# Patient Record
Sex: Female | Born: 1955 | Race: Black or African American | Hispanic: No | Smoking: Never smoker
Health system: Southern US, Community
[De-identification: ages and names within clinical notes are randomized; demographics above are authoritative.]

## PROBLEM LIST (undated history)

## (undated) DIAGNOSIS — I10 Essential (primary) hypertension: Secondary | ICD-10-CM

## (undated) DIAGNOSIS — K219 Gastro-esophageal reflux disease without esophagitis: Secondary | ICD-10-CM

## (undated) DIAGNOSIS — E079 Disorder of thyroid, unspecified: Secondary | ICD-10-CM

## (undated) DIAGNOSIS — G5600 Carpal tunnel syndrome, unspecified upper limb: Secondary | ICD-10-CM

## (undated) DIAGNOSIS — D219 Benign neoplasm of connective and other soft tissue, unspecified: Secondary | ICD-10-CM

## (undated) DIAGNOSIS — M549 Dorsalgia, unspecified: Secondary | ICD-10-CM

## (undated) DIAGNOSIS — M779 Enthesopathy, unspecified: Secondary | ICD-10-CM

## (undated) DIAGNOSIS — R0602 Shortness of breath: Secondary | ICD-10-CM

## (undated) DIAGNOSIS — R011 Cardiac murmur, unspecified: Secondary | ICD-10-CM

## (undated) DIAGNOSIS — E039 Hypothyroidism, unspecified: Secondary | ICD-10-CM

## (undated) DIAGNOSIS — M25519 Pain in unspecified shoulder: Secondary | ICD-10-CM

## (undated) DIAGNOSIS — D649 Anemia, unspecified: Secondary | ICD-10-CM

## (undated) DIAGNOSIS — M7710 Lateral epicondylitis, unspecified elbow: Secondary | ICD-10-CM

## (undated) DIAGNOSIS — M6740 Ganglion, unspecified site: Secondary | ICD-10-CM

## (undated) DIAGNOSIS — R51 Headache: Secondary | ICD-10-CM

## (undated) HISTORY — PX: CARDIAC CATHETERIZATION: SHX172

## (undated) HISTORY — PX: ABDOMINAL HYSTERECTOMY: SHX81

## (undated) HISTORY — PX: CHOLECYSTECTOMY: SHX55

## (undated) HISTORY — PX: OTHER SURGICAL HISTORY: SHX169

## (undated) HISTORY — PX: TUBAL LIGATION: SHX77

## (undated) HISTORY — PX: THYROID SURGERY: SHX805

## (undated) HISTORY — PX: ROTATOR CUFF REPAIR: SHX139

## (undated) HISTORY — PX: BACK SURGERY: SHX140

---

## 2003-06-20 ENCOUNTER — Encounter: Admission: RE | Admit: 2003-06-20 | Discharge: 2003-06-20 | Payer: Self-pay | Admitting: Ophthalmology

## 2003-07-04 ENCOUNTER — Encounter: Admission: RE | Admit: 2003-07-04 | Discharge: 2003-07-04 | Payer: Self-pay | Admitting: *Deleted

## 2003-12-21 ENCOUNTER — Emergency Department (HOSPITAL_COMMUNITY): Admission: EM | Admit: 2003-12-21 | Discharge: 2003-12-21 | Payer: Self-pay | Admitting: Family Medicine

## 2004-10-27 ENCOUNTER — Encounter: Admission: RE | Admit: 2004-10-27 | Discharge: 2004-11-30 | Payer: Self-pay | Admitting: Orthopedic Surgery

## 2005-02-02 ENCOUNTER — Encounter: Admission: RE | Admit: 2005-02-02 | Discharge: 2005-02-02 | Payer: Self-pay | Admitting: Internal Medicine

## 2005-04-09 ENCOUNTER — Encounter: Admission: RE | Admit: 2005-04-09 | Discharge: 2005-04-22 | Payer: Self-pay | Admitting: Orthopedic Surgery

## 2005-06-15 ENCOUNTER — Observation Stay (HOSPITAL_COMMUNITY): Admission: RE | Admit: 2005-06-15 | Discharge: 2005-06-16 | Payer: Self-pay | Admitting: Orthopedic Surgery

## 2005-11-09 ENCOUNTER — Encounter: Admission: RE | Admit: 2005-11-09 | Discharge: 2005-11-29 | Payer: Self-pay | Admitting: Orthopedic Surgery

## 2006-01-17 ENCOUNTER — Emergency Department (HOSPITAL_COMMUNITY): Admission: EM | Admit: 2006-01-17 | Discharge: 2006-01-17 | Payer: Self-pay | Admitting: Family Medicine

## 2006-02-02 ENCOUNTER — Encounter: Admission: RE | Admit: 2006-02-02 | Discharge: 2006-05-03 | Payer: Self-pay | Admitting: Specialist

## 2006-04-20 ENCOUNTER — Inpatient Hospital Stay (HOSPITAL_COMMUNITY): Admission: AD | Admit: 2006-04-20 | Discharge: 2006-04-21 | Payer: Self-pay | Admitting: Orthopedic Surgery

## 2006-04-25 ENCOUNTER — Emergency Department (HOSPITAL_COMMUNITY): Admission: EM | Admit: 2006-04-25 | Discharge: 2006-04-26 | Payer: Self-pay | Admitting: Emergency Medicine

## 2006-05-20 ENCOUNTER — Encounter: Admission: RE | Admit: 2006-05-20 | Discharge: 2006-08-18 | Payer: Self-pay | Admitting: Specialist

## 2006-06-21 ENCOUNTER — Encounter: Admission: RE | Admit: 2006-06-21 | Discharge: 2006-08-05 | Payer: Self-pay | Admitting: Orthopedic Surgery

## 2006-08-12 ENCOUNTER — Encounter: Admission: RE | Admit: 2006-08-12 | Discharge: 2006-08-12 | Payer: Self-pay | Admitting: Orthopedic Surgery

## 2006-09-27 ENCOUNTER — Encounter: Admission: RE | Admit: 2006-09-27 | Discharge: 2006-09-27 | Payer: Self-pay | Admitting: Orthopedic Surgery

## 2006-10-27 ENCOUNTER — Encounter: Admission: RE | Admit: 2006-10-27 | Discharge: 2006-10-27 | Payer: Self-pay | Admitting: Specialist

## 2006-12-12 ENCOUNTER — Encounter: Admission: RE | Admit: 2006-12-12 | Discharge: 2006-12-12 | Payer: Self-pay | Admitting: Internal Medicine

## 2007-03-06 ENCOUNTER — Encounter: Admission: RE | Admit: 2007-03-06 | Discharge: 2007-03-06 | Payer: Self-pay | Admitting: Internal Medicine

## 2007-04-10 ENCOUNTER — Emergency Department (HOSPITAL_COMMUNITY): Admission: EM | Admit: 2007-04-10 | Discharge: 2007-04-11 | Payer: Self-pay | Admitting: Emergency Medicine

## 2007-06-21 ENCOUNTER — Encounter: Admission: RE | Admit: 2007-06-21 | Discharge: 2007-06-21 | Payer: Self-pay | Admitting: Anesthesiology

## 2007-06-28 ENCOUNTER — Emergency Department (HOSPITAL_COMMUNITY): Admission: EM | Admit: 2007-06-28 | Discharge: 2007-06-28 | Payer: Self-pay | Admitting: Emergency Medicine

## 2008-02-29 ENCOUNTER — Ambulatory Visit (HOSPITAL_COMMUNITY): Admission: RE | Admit: 2008-02-29 | Discharge: 2008-02-29 | Payer: Self-pay | Admitting: *Deleted

## 2008-06-06 ENCOUNTER — Encounter: Admission: RE | Admit: 2008-06-06 | Discharge: 2008-06-06 | Payer: Self-pay | Admitting: Endocrinology

## 2009-02-03 ENCOUNTER — Encounter: Admission: RE | Admit: 2009-02-03 | Discharge: 2009-02-14 | Payer: Self-pay | Admitting: Orthopedic Surgery

## 2009-04-10 ENCOUNTER — Emergency Department (HOSPITAL_COMMUNITY): Admission: EM | Admit: 2009-04-10 | Discharge: 2009-04-10 | Payer: Self-pay | Admitting: Family Medicine

## 2009-04-28 ENCOUNTER — Encounter: Admission: RE | Admit: 2009-04-28 | Discharge: 2009-04-28 | Payer: Self-pay | Admitting: Internal Medicine

## 2009-05-09 ENCOUNTER — Encounter: Admission: RE | Admit: 2009-05-09 | Discharge: 2009-05-09 | Payer: Self-pay | Admitting: Gastroenterology

## 2009-05-29 ENCOUNTER — Encounter
Admission: RE | Admit: 2009-05-29 | Discharge: 2009-08-27 | Payer: Self-pay | Admitting: Physical Medicine & Rehabilitation

## 2009-06-02 ENCOUNTER — Ambulatory Visit (HOSPITAL_COMMUNITY): Admission: RE | Admit: 2009-06-02 | Discharge: 2009-06-02 | Payer: Self-pay | Admitting: Gastroenterology

## 2009-06-03 ENCOUNTER — Ambulatory Visit: Payer: Self-pay | Admitting: Physical Medicine & Rehabilitation

## 2009-06-19 ENCOUNTER — Ambulatory Visit: Payer: Self-pay | Admitting: Physical Medicine & Rehabilitation

## 2009-07-17 ENCOUNTER — Ambulatory Visit: Payer: Self-pay | Admitting: Physical Medicine & Rehabilitation

## 2009-08-11 ENCOUNTER — Ambulatory Visit (HOSPITAL_COMMUNITY): Admission: RE | Admit: 2009-08-11 | Discharge: 2009-08-13 | Payer: Self-pay | Admitting: General Surgery

## 2009-08-11 ENCOUNTER — Encounter (INDEPENDENT_AMBULATORY_CARE_PROVIDER_SITE_OTHER): Payer: Self-pay | Admitting: General Surgery

## 2009-08-28 ENCOUNTER — Encounter
Admission: RE | Admit: 2009-08-28 | Discharge: 2009-11-26 | Payer: Self-pay | Admitting: Physical Medicine & Rehabilitation

## 2009-09-05 ENCOUNTER — Ambulatory Visit: Payer: Self-pay | Admitting: Physical Medicine & Rehabilitation

## 2009-10-13 ENCOUNTER — Ambulatory Visit: Payer: Self-pay | Admitting: Physical Medicine & Rehabilitation

## 2009-11-10 ENCOUNTER — Ambulatory Visit: Payer: Self-pay | Admitting: Physical Medicine & Rehabilitation

## 2009-12-02 ENCOUNTER — Ambulatory Visit: Payer: Self-pay | Admitting: Cardiology

## 2009-12-02 ENCOUNTER — Inpatient Hospital Stay (HOSPITAL_COMMUNITY): Admission: EM | Admit: 2009-12-02 | Discharge: 2009-12-03 | Payer: Self-pay | Admitting: Emergency Medicine

## 2009-12-02 ENCOUNTER — Emergency Department (HOSPITAL_COMMUNITY): Admission: EM | Admit: 2009-12-02 | Discharge: 2009-12-02 | Payer: Self-pay | Admitting: Family Medicine

## 2009-12-03 ENCOUNTER — Encounter (INDEPENDENT_AMBULATORY_CARE_PROVIDER_SITE_OTHER): Payer: Self-pay | Admitting: Emergency Medicine

## 2009-12-03 ENCOUNTER — Ambulatory Visit: Payer: Self-pay | Admitting: Vascular Surgery

## 2010-01-27 ENCOUNTER — Observation Stay (HOSPITAL_COMMUNITY)
Admission: RE | Admit: 2010-01-27 | Discharge: 2010-01-28 | Payer: Self-pay | Source: Home / Self Care | Attending: Orthopedic Surgery | Admitting: Orthopedic Surgery

## 2010-03-08 ENCOUNTER — Encounter: Payer: Self-pay | Admitting: Internal Medicine

## 2010-03-08 ENCOUNTER — Encounter: Payer: Self-pay | Admitting: Ophthalmology

## 2010-04-27 ENCOUNTER — Other Ambulatory Visit (HOSPITAL_COMMUNITY): Payer: Self-pay | Admitting: Unknown Physician Specialty

## 2010-04-27 DIAGNOSIS — M545 Low back pain, unspecified: Secondary | ICD-10-CM

## 2010-04-27 DIAGNOSIS — M961 Postlaminectomy syndrome, not elsewhere classified: Secondary | ICD-10-CM

## 2010-04-27 LAB — DIFFERENTIAL
Basophils Absolute: 0 10*3/uL (ref 0.0–0.1)
Basophils Relative: 0 % (ref 0–1)
Eosinophils Absolute: 0 10*3/uL (ref 0.0–0.7)
Eosinophils Relative: 0 % (ref 0–5)
Lymphocytes Relative: 36 % (ref 12–46)
Lymphs Abs: 2.7 10*3/uL (ref 0.7–4.0)
Monocytes Absolute: 0.5 10*3/uL (ref 0.1–1.0)
Monocytes Relative: 7 % (ref 3–12)
Neutro Abs: 4.2 10*3/uL (ref 1.7–7.7)
Neutrophils Relative %: 57 % (ref 43–77)

## 2010-04-27 LAB — BASIC METABOLIC PANEL
BUN: 11 mg/dL (ref 6–23)
CO2: 25 mEq/L (ref 19–32)
Calcium: 10.3 mg/dL (ref 8.4–10.5)
Chloride: 109 mEq/L (ref 96–112)
Creatinine, Ser: 0.89 mg/dL (ref 0.4–1.2)
GFR calc Af Amer: >60 mL/min (ref >60)
GFR calc non Af Amer: >60 mL/min (ref >60)
Glucose, Bld: 167 mg/dL — ABNORMAL HIGH (ref 70–99)
Potassium: 4.3 mEq/L (ref 3.5–5.1)
Sodium: 141 mEq/L (ref 135–145)

## 2010-04-27 LAB — PROTIME-INR
INR: 0.96 (ref 0.00–1.49)
Prothrombin Time: 13 seconds (ref 11.6–15.2)

## 2010-04-27 LAB — GLUCOSE, CAPILLARY
Glucose-Capillary: 112 mg/dL — ABNORMAL HIGH (ref 70–99)
Glucose-Capillary: 119 mg/dL — ABNORMAL HIGH (ref 70–99)
Glucose-Capillary: 192 mg/dL — ABNORMAL HIGH (ref 70–99)
Glucose-Capillary: 80 mg/dL (ref 70–99)
Glucose-Capillary: 89 mg/dL (ref 70–99)
Glucose-Capillary: 98 mg/dL (ref 70–99)

## 2010-04-27 LAB — CBC
HCT: 32.1 % — ABNORMAL LOW (ref 36.0–46.0)
Hemoglobin: 10.3 g/dL — ABNORMAL LOW (ref 12.0–15.0)
MCH: 27.2 pg (ref 26.0–34.0)
MCHC: 32.1 g/dL (ref 30.0–36.0)
MCV: 84.7 fL (ref 78.0–100.0)
Platelets: 265 10*3/uL (ref 150–400)
RBC: 3.79 MIL/uL — ABNORMAL LOW (ref 3.87–5.11)
RDW: 14.1 % (ref 11.5–15.5)
WBC: 7.5 10*3/uL (ref 4.0–10.5)

## 2010-04-27 LAB — SURGICAL PCR SCREEN
MRSA, PCR: NEGATIVE
Staphylococcus aureus: NEGATIVE

## 2010-04-29 LAB — CBC
HCT: 30.4 % — ABNORMAL LOW (ref 36.0–46.0)
HCT: 31.9 % — ABNORMAL LOW (ref 36.0–46.0)
Hemoglobin: 10 g/dL — ABNORMAL LOW (ref 12.0–15.0)
Hemoglobin: 9.6 g/dL — ABNORMAL LOW (ref 12.0–15.0)
MCH: 27.1 pg (ref 26.0–34.0)
MCH: 27.4 pg (ref 26.0–34.0)
MCHC: 31.3 g/dL (ref 30.0–36.0)
MCHC: 31.6 g/dL (ref 30.0–36.0)
MCV: 85.9 fL (ref 78.0–100.0)
MCV: 87.4 fL (ref 78.0–100.0)
Platelets: 266 10*3/uL (ref 150–400)
Platelets: 278 10*3/uL (ref 150–400)
RBC: 3.54 MIL/uL — ABNORMAL LOW (ref 3.87–5.11)
RBC: 3.65 MIL/uL — ABNORMAL LOW (ref 3.87–5.11)
RDW: 14 % (ref 11.5–15.5)
RDW: 14.1 % (ref 11.5–15.5)
WBC: 5.2 10*3/uL (ref 4.0–10.5)
WBC: 6.4 10*3/uL (ref 4.0–10.5)

## 2010-04-29 LAB — DIFFERENTIAL
Basophils Absolute: 0 10*3/uL (ref 0.0–0.1)
Basophils Absolute: 0 10*3/uL (ref 0.0–0.1)
Basophils Relative: 0 % (ref 0–1)
Basophils Relative: 0 % (ref 0–1)
Eosinophils Absolute: 0 10*3/uL (ref 0.0–0.7)
Eosinophils Absolute: 0 10*3/uL (ref 0.0–0.7)
Eosinophils Relative: 0 % (ref 0–5)
Eosinophils Relative: 0 % (ref 0–5)
Lymphocytes Relative: 44 % (ref 12–46)
Lymphocytes Relative: 54 % — ABNORMAL HIGH (ref 12–46)
Lymphs Abs: 2.8 10*3/uL (ref 0.7–4.0)
Lymphs Abs: 2.8 10*3/uL (ref 0.7–4.0)
Monocytes Absolute: 0.4 10*3/uL (ref 0.1–1.0)
Monocytes Absolute: 0.5 10*3/uL (ref 0.1–1.0)
Monocytes Relative: 8 % (ref 3–12)
Monocytes Relative: 8 % (ref 3–12)
Neutro Abs: 2 10*3/uL (ref 1.7–7.7)
Neutro Abs: 3.1 10*3/uL (ref 1.7–7.7)
Neutrophils Relative %: 38 % — ABNORMAL LOW (ref 43–77)
Neutrophils Relative %: 48 % (ref 43–77)

## 2010-04-29 LAB — URINALYSIS, ROUTINE W REFLEX MICROSCOPIC
Bilirubin Urine: NEGATIVE
Glucose, UA: NEGATIVE mg/dL
Hgb urine dipstick: NEGATIVE
Ketones, ur: NEGATIVE mg/dL
Nitrite: NEGATIVE
Protein, ur: NEGATIVE mg/dL
Specific Gravity, Urine: 1.011 (ref 1.005–1.030)
Urobilinogen, UA: 0.2 mg/dL (ref 0.0–1.0)
pH: 5.5 (ref 5.0–8.0)

## 2010-04-29 LAB — GLUCOSE, CAPILLARY
Glucose-Capillary: 133 mg/dL — ABNORMAL HIGH (ref 70–99)
Glucose-Capillary: 155 mg/dL — ABNORMAL HIGH (ref 70–99)
Glucose-Capillary: 184 mg/dL — ABNORMAL HIGH (ref 70–99)
Glucose-Capillary: 236 mg/dL — ABNORMAL HIGH (ref 70–99)
Glucose-Capillary: 73 mg/dL (ref 70–99)
Glucose-Capillary: 73 mg/dL (ref 70–99)
Glucose-Capillary: 92 mg/dL (ref 70–99)
Glucose-Capillary: 95 mg/dL (ref 70–99)

## 2010-04-29 LAB — POCT I-STAT, CHEM 8
BUN: 17 mg/dL (ref 6–23)
Calcium, Ion: 1.15 mmol/L (ref 1.12–1.32)
Chloride: 104 mEq/L (ref 96–112)
Creatinine, Ser: 0.9 mg/dL (ref 0.4–1.2)
Glucose, Bld: 76 mg/dL (ref 70–99)
HCT: 34 % — ABNORMAL LOW (ref 36.0–46.0)
Hemoglobin: 11.6 g/dL — ABNORMAL LOW (ref 12.0–15.0)
Potassium: 3.9 mEq/L (ref 3.5–5.1)
Sodium: 140 mEq/L (ref 135–145)
TCO2: 27 mmol/L (ref 0–100)

## 2010-04-29 LAB — POCT CARDIAC MARKERS
CKMB, poc: <1 ng/mL — ABNORMAL LOW (ref 1.0–8.0)
Myoglobin, poc: 54.7 ng/mL (ref 12–200)
Troponin i, poc: <0.05 ng/mL (ref 0.00–0.09)

## 2010-04-29 LAB — COMPREHENSIVE METABOLIC PANEL
ALT: 38 U/L — ABNORMAL HIGH (ref 0–35)
AST: 31 U/L (ref 0–37)
Albumin: 3.8 g/dL (ref 3.5–5.2)
Alkaline Phosphatase: 82 U/L (ref 39–117)
BUN: 12 mg/dL (ref 6–23)
CO2: 27 mEq/L (ref 19–32)
Calcium: 9.4 mg/dL (ref 8.4–10.5)
Chloride: 103 mEq/L (ref 96–112)
Creatinine, Ser: 0.84 mg/dL (ref 0.4–1.2)
GFR calc Af Amer: >60 mL/min (ref >60)
GFR calc non Af Amer: >60 mL/min (ref >60)
Glucose, Bld: 77 mg/dL (ref 70–99)
Potassium: 3.4 mEq/L — ABNORMAL LOW (ref 3.5–5.1)
Sodium: 140 mEq/L (ref 135–145)
Total Bilirubin: 0.6 mg/dL (ref 0.3–1.2)
Total Protein: 7.1 g/dL (ref 6.0–8.3)

## 2010-04-29 LAB — APTT: aPTT: 30 seconds (ref 24–37)

## 2010-04-29 LAB — MAGNESIUM: Magnesium: 2 mg/dL (ref 1.5–2.5)

## 2010-04-29 LAB — BASIC METABOLIC PANEL
BUN: 11 mg/dL (ref 6–23)
CO2: 29 mEq/L (ref 19–32)
Calcium: 9 mg/dL (ref 8.4–10.5)
Chloride: 107 mEq/L (ref 96–112)
Creatinine, Ser: 1.01 mg/dL (ref 0.4–1.2)
GFR calc Af Amer: >60 mL/min (ref >60)
GFR calc non Af Amer: 57 mL/min — ABNORMAL LOW (ref >60)
Glucose, Bld: 101 mg/dL — ABNORMAL HIGH (ref 70–99)
Potassium: 3.6 mEq/L (ref 3.5–5.1)
Sodium: 140 mEq/L (ref 135–145)

## 2010-04-29 LAB — PROTIME-INR
INR: 0.97 (ref 0.00–1.49)
Prothrombin Time: 13.1 seconds (ref 11.6–15.2)

## 2010-04-29 LAB — TSH: TSH: 1.577 u[IU]/mL (ref 0.350–4.500)

## 2010-04-29 LAB — TROPONIN I: Troponin I: <0.01 ng/mL (ref 0.00–0.06)

## 2010-04-29 LAB — HEMOGLOBIN A1C
Hgb A1c MFr Bld: 8.6 % — ABNORMAL HIGH (ref <5.7)
Mean Plasma Glucose: 200 mg/dL — ABNORMAL HIGH (ref <117)

## 2010-04-29 LAB — CK TOTAL AND CKMB (NOT AT ARMC)
CK, MB: 1.3 ng/mL (ref 0.3–4.0)
Relative Index: 1.2 (ref 0.0–2.5)
Total CK: 107 U/L (ref 7–177)

## 2010-04-30 ENCOUNTER — Ambulatory Visit (HOSPITAL_COMMUNITY)
Admission: RE | Admit: 2010-04-30 | Discharge: 2010-04-30 | Disposition: A | Discharge status: Home / Self Care | Payer: PRIVATE HEALTH INSURANCE | Source: Ambulatory Visit | Attending: Pain Medicine | Admitting: Pain Medicine

## 2010-04-30 DIAGNOSIS — M5137 Other intervertebral disc degeneration, lumbosacral region: Secondary | ICD-10-CM | POA: Insufficient documentation

## 2010-04-30 DIAGNOSIS — M51379 Other intervertebral disc degeneration, lumbosacral region without mention of lumbar back pain or lower extremity pain: Secondary | ICD-10-CM | POA: Insufficient documentation

## 2010-04-30 DIAGNOSIS — M545 Low back pain, unspecified: Secondary | ICD-10-CM

## 2010-04-30 DIAGNOSIS — M961 Postlaminectomy syndrome, not elsewhere classified: Secondary | ICD-10-CM

## 2010-04-30 LAB — CREATININE, SERUM
Creatinine, Ser: 0.99 mg/dL (ref 0.4–1.2)
GFR calc Af Amer: >60 mL/min (ref >60)
GFR calc non Af Amer: 58 mL/min — ABNORMAL LOW (ref >60)

## 2010-04-30 MED ORDER — GADOBENATE DIMEGLUMINE 529 MG/ML IV SOLN
15.0000 mL | Freq: Once | INTRAVENOUS | Status: AC | PRN
  Administered 2010-04-30: 15 mL via INTRAVENOUS

## 2010-05-03 LAB — GLUCOSE, CAPILLARY
Glucose-Capillary: 125 mg/dL — ABNORMAL HIGH (ref 70–99)
Glucose-Capillary: 130 mg/dL — ABNORMAL HIGH (ref 70–99)
Glucose-Capillary: 137 mg/dL — ABNORMAL HIGH (ref 70–99)
Glucose-Capillary: 142 mg/dL — ABNORMAL HIGH (ref 70–99)
Glucose-Capillary: 143 mg/dL — ABNORMAL HIGH (ref 70–99)
Glucose-Capillary: 143 mg/dL — ABNORMAL HIGH (ref 70–99)
Glucose-Capillary: 148 mg/dL — ABNORMAL HIGH (ref 70–99)
Glucose-Capillary: 152 mg/dL — ABNORMAL HIGH (ref 70–99)
Glucose-Capillary: 161 mg/dL — ABNORMAL HIGH (ref 70–99)
Glucose-Capillary: 167 mg/dL — ABNORMAL HIGH (ref 70–99)
Glucose-Capillary: 174 mg/dL — ABNORMAL HIGH (ref 70–99)
Glucose-Capillary: 188 mg/dL — ABNORMAL HIGH (ref 70–99)
Glucose-Capillary: 194 mg/dL — ABNORMAL HIGH (ref 70–99)
Glucose-Capillary: 53 mg/dL — ABNORMAL LOW (ref 70–99)
Glucose-Capillary: 67 mg/dL — ABNORMAL LOW (ref 70–99)
Glucose-Capillary: 71 mg/dL (ref 70–99)
Glucose-Capillary: 89 mg/dL (ref 70–99)
Glucose-Capillary: 92 mg/dL (ref 70–99)
Glucose-Capillary: 93 mg/dL (ref 70–99)

## 2010-05-03 LAB — BASIC METABOLIC PANEL
BUN: 11 mg/dL (ref 6–23)
CO2: 32 mEq/L (ref 19–32)
Calcium: 9.1 mg/dL (ref 8.4–10.5)
Chloride: 103 mEq/L (ref 96–112)
Creatinine, Ser: 1.08 mg/dL (ref 0.4–1.2)
GFR calc Af Amer: >60 mL/min (ref >60)
GFR calc non Af Amer: 53 mL/min — ABNORMAL LOW (ref >60)
Glucose, Bld: 89 mg/dL (ref 70–99)
Potassium: 3.7 mEq/L (ref 3.5–5.1)
Sodium: 141 mEq/L (ref 135–145)

## 2010-05-03 LAB — DIFFERENTIAL
Basophils Absolute: 0 10*3/uL (ref 0.0–0.1)
Basophils Relative: 0 % (ref 0–1)
Eosinophils Absolute: 0 10*3/uL (ref 0.0–0.7)
Eosinophils Relative: 0 % (ref 0–5)
Lymphocytes Relative: 41 % (ref 12–46)
Lymphs Abs: 3.2 10*3/uL (ref 0.7–4.0)
Monocytes Absolute: 0.5 10*3/uL (ref 0.1–1.0)
Monocytes Relative: 7 % (ref 3–12)
Neutro Abs: 4 10*3/uL (ref 1.7–7.7)
Neutrophils Relative %: 52 % (ref 43–77)

## 2010-05-03 LAB — COMPREHENSIVE METABOLIC PANEL
ALT: 19 U/L (ref 0–35)
AST: 17 U/L (ref 0–37)
Albumin: 4 g/dL (ref 3.5–5.2)
Alkaline Phosphatase: 64 U/L (ref 39–117)
BUN: 17 mg/dL (ref 6–23)
CO2: 31 mEq/L (ref 19–32)
Calcium: 9.4 mg/dL (ref 8.4–10.5)
Chloride: 101 mEq/L (ref 96–112)
Creatinine, Ser: 0.91 mg/dL (ref 0.4–1.2)
GFR calc Af Amer: >60 mL/min (ref >60)
GFR calc non Af Amer: >60 mL/min (ref >60)
Glucose, Bld: 177 mg/dL — ABNORMAL HIGH (ref 70–99)
Potassium: 4.2 mEq/L (ref 3.5–5.1)
Sodium: 140 mEq/L (ref 135–145)
Total Bilirubin: 0.6 mg/dL (ref 0.3–1.2)
Total Protein: 7.3 g/dL (ref 6.0–8.3)

## 2010-05-03 LAB — CBC
HCT: 30.1 % — ABNORMAL LOW (ref 36.0–46.0)
HCT: 34.3 % — ABNORMAL LOW (ref 36.0–46.0)
Hemoglobin: 11 g/dL — ABNORMAL LOW (ref 12.0–15.0)
Hemoglobin: 9.9 g/dL — ABNORMAL LOW (ref 12.0–15.0)
MCH: 28.7 pg (ref 26.0–34.0)
MCHC: 32 g/dL (ref 30.0–36.0)
MCHC: 32.8 g/dL (ref 30.0–36.0)
MCV: 87.4 fL (ref 78.0–100.0)
MCV: 87.9 fL (ref 78.0–100.0)
Platelets: 231 10*3/uL (ref 150–400)
Platelets: 280 10*3/uL (ref 150–400)
RBC: 3.45 MIL/uL — ABNORMAL LOW (ref 3.87–5.11)
RBC: 3.91 MIL/uL (ref 3.87–5.11)
RDW: 13.9 % (ref 11.5–15.5)
RDW: 14.3 % (ref 11.5–15.5)
WBC: 6.9 10*3/uL (ref 4.0–10.5)
WBC: 7.7 10*3/uL (ref 4.0–10.5)

## 2010-05-03 LAB — HEPATIC FUNCTION PANEL
ALT: 28 U/L (ref 0–35)
AST: 31 U/L (ref 0–37)
Albumin: 3.5 g/dL (ref 3.5–5.2)
Alkaline Phosphatase: 63 U/L (ref 39–117)
Bilirubin, Direct: <0.1 mg/dL (ref 0.0–0.3)
Total Bilirubin: 0.3 mg/dL (ref 0.3–1.2)
Total Protein: 6.5 g/dL (ref 6.0–8.3)

## 2010-05-03 LAB — SURGICAL PCR SCREEN
MRSA, PCR: NEGATIVE
Staphylococcus aureus: NEGATIVE

## 2010-05-04 ENCOUNTER — Other Ambulatory Visit (HOSPITAL_COMMUNITY): Payer: Self-pay

## 2010-06-01 LAB — GLUCOSE, CAPILLARY: Glucose-Capillary: 271 mg/dL — ABNORMAL HIGH (ref 70–99)

## 2010-06-30 NOTE — Op Note (Signed)
[name], Brianna Mitchell NO.:  [id number]   MEDICAL RECORD NO.:  [id number]          PATIENT TYPE:  AMB   LOCATION:  ENDO                         FACILITY:  [facility]   PHYSICIAN:  Georgiana Spinner, M.D.    DATE OF BIRTH:  06-Jun-1955   DATE OF PROCEDURE:  DATE OF DISCHARGE:                               OPERATIVE REPORT   PROCEDURE:  Upper endoscopy.   INDICATIONS:  Gastroesophageal reflux disease.   ANESTHESIA:  Fentanyl 60 mcg, Versed 6 mg.   PROCEDURE:  With the patient mildly sedated in the left lateral  decubitus position, the Pentax videoscopic endoscope was inserted in the  mouth and passed under direct vision through the esophagus which  appeared normal, into the stomach.  Fundus, body, antrum appeared normal  as did the duodenal bulb, second portion of the duodenum.  From this  point the endoscope was slowly withdrawn taking circumferential views of  the duodenal mucosa until the endoscope was pulled back into the stomach  and placed in retroflexion to view the stomach from below.  The  endoscope was straightened and withdrawn taking circumferential views of  the remaining gastric and esophageal mucosa.  The patient's vital signs,  pulse oximeter remained stable.  The patient tolerated the procedure  well without apparent complications.   FINDINGS:  Unremarkable examination.   PLAN:  Proceed to colonoscopy.           ______________________________  Georgiana Spinner, M.D.     GMO/MEDQ  D:  02/29/2008  T:  02/29/2008  Job:  [id number]

## 2010-06-30 NOTE — Op Note (Signed)
NAMERODNISHA, BLOMGREN NO.:  [id number]   MEDICAL RECORD NO.:  [id number]          PATIENT TYPE:  AMB   LOCATION:  ENDO                         FACILITY:  [facility]   PHYSICIAN:  Georgiana Spinner, M.D.    DATE OF BIRTH:  Jul 30, 1955   DATE OF PROCEDURE:  02/29/2008  DATE OF DISCHARGE:                               OPERATIVE REPORT   PROCEDURE:  Colonoscopy.   INDICATIONS:  Colon cancer screening, family history of colon cancer.   ANESTHESIA:  Fentanyl 25 mcg, Versed 2.5 mg.   PROCEDURE:  With the patient mildly sedated in the left lateral  decubitus position, the Pentax videoscopic colonoscope was inserted in  the rectum and passed under direct vision with pressure applied.  We  reached the cecum identified by ileocecal valve and appendiceal orifice,  both of which were photographed.  From this point, the colonoscope was  slowly withdrawn, taking circumferential views of colonic mucosa,  stopping in the rectum which appeared normal on direct and retroflexed  view.  The endoscope was straightened and withdrawn.  The patient's  vital signs and pulse oximeter remained stable.  The patient tolerated  procedure well without apparent complication.   FINDINGS:  Negative examination.   PLAN:  Repeat examination in 5 years due to family history.           ______________________________  Georgiana Spinner, M.D.     GMO/MEDQ  D:  02/29/2008  T:  02/29/2008  Job:  [id number]

## 2010-07-03 NOTE — Op Note (Signed)
NAMESUPRINA, MANDEVILLE NO.:  [id number]   MEDICAL RECORD NO.:  [id number]          PATIENT TYPE:  INP   LOCATION:  5034                         FACILITY:  [facility]   PHYSICIAN:  Burnard Bunting, M.D.    DATE OF BIRTH:  [birth year]   DATE OF PROCEDURE:  04/19/2006  DATE OF DISCHARGE:  04/21/2006                               OPERATIVE REPORT   PREOPERATIVE DIAGNOSIS:  Left shoulder bursitis, synovitis, labral  tearing, rotator cuff tear.   POSTOPERATIVE DIAGNOSIS:  Left shoulder bursitis, synovitis, labral  tearing, rotator cuff tear.   PROCEDURE:  Left shoulder diagnostic arthroscopy with subacromial  decompression, mini open rotator cuff repair and extensive debridement  of the synovitis and labral tearing on the inside of the intraarticular  joint.   SURGEON:  Burnard Bunting, M.D.   ASSISTANT:  None.   ANESTHESIA:  General endotracheal.   ESTIMATED BLOOD LOSS:  Minimal.   INDICATION:  Brianna Mitchell is a 55 year old patient who underwent right  shoulder subacromial decompression in the past.  She has a full  thickness rotator cuff tear, bursitis and synovitis in the left shoulder  and presents now for operative management after failure of nonoperative  management and explanation of risks and benefits.   OPERATIVE FINDINGS:  1. Examination under anesthesia:  Range of motion external rotation 50      degree, abduction 60 degrees, forward flexion is 170, isolated limb      movement on abduction is about 95.  2. Diagnostic arthroscopy:  1)  Labral tearing and synovitis through      the rotator interval and around the superior aspect of the labrum.      2)  Stables biceps anchor.  3)  Mild chondral damage on the      superior surface of the humeral head.  4)  Full thickness rotator      cuff tear.  5)  Significant impingement and bursitis in a type 3      acromion.   PROCEDURE IN DETAIL:  The patient was brought to the operating room  where general endotracheal  anesthesia was induced.  Preoperative  antibiotics were administered.  The patient was placed in the beach  chair position with the head in neutral position and the right arm well  padded.  The right arm, hand and shoulder were prepped with DuraPrep  solution and draped in a sterile manner.  Topographic anatomy of the  shoulder was identified including the posterolateral and anterior  margins of the acromion.  Solution of epinephrine and saline was  injected in the subacromial space.  The solution of saline was injected  in glenohumeral joint.  A posterior portal was created 2 cm medial and  inferior to the posterior-lateral margin of acromion.  Diagnostic  arthroscopy was performed.  An anterior portal was created.  Significant  synovitis and labral fraying and tearing were noted in the anterior and  superior aspect of glenoid rim and shoulder respectively.  This was  extensively debrided with the shave and ArthroCare wand.  The biceps  anchor was stable.  There was some chondral fraying on the superior  aspect of the humeral head which was also debrided.  No full thickness  chondral defects were noted.  A rotator cuff tear was also debrided.  Biceps was mobilized into the joint and no extraarticular tearing was  noted.  Following debridement of the intraarticular portion of the  joint, a lateral portal was created.  Subacromial decompression of the  extensive bursitis was performed with releasing the CA ligament and  removal of bone spur off the anterior-lateral aspect of the acromion.  At this time, the instruments were removed from the portals which the  anterior and posterior portals were closed using 3-0 nylon suture.  Collier Flowers was applied to the operative field and the lateral portal was  extended distal and proximal for about 2 cm.  The deltoid was then split  and measured 3 cm from the anterior-lateral margin of the acromion.  A  moderate sized rotator cuff tear was encountered.  It  was mobilized.  The edges were debrided and it was repaired using two push-lock suture  anchors and two cork screw suture anchors.  Solid repair was achieved  which was stable through a range of motion.  The incision was irrigated  at this time.  The deltoid split was closed using #1 Vicryl sutures.  The skin was then reapproximated using interrupted, inverted 2-0 Vicryl  suture and a running 3-0 pullout Prolene.  A bulky dressing was applied.  The patient tolerated the procedure well without immediate  complications.      Burnard Bunting, M.D.  Electronically Signed     GSD/MEDQ  D:  06/15/2006  T:  06/15/2006  Job:  [id number]

## 2010-07-03 NOTE — Op Note (Signed)
NAMEAFTYN, NOTT NO.:  [id number]   MEDICAL RECORD NO.:  [id number]          PATIENT TYPE:  OIB   LOCATION:  5034                         FACILITY:  [facility]   PHYSICIAN:  Burnard Bunting, M.D.    DATE OF BIRTH:  [birth year]   DATE OF PROCEDURE:  04/19/2006  DATE OF DISCHARGE:                               OPERATIVE REPORT   PREOPERATIVE DIAGNOSES:  Left shoulder rotator cuff tear with bursitis  and mild synovitis.   POSTOPERATIVE DIAGNOSES:  Left shoulder rotator cuff tear with bursitis  and mild synovitis.   PROCEDURES:  Left shoulder diagnostic and operative arthroscopy with  limited debridement, arthroscopic subacromial decompression and mini-  open rotator cuff repair.   ATTENDING SURGEON:  Burnard Bunting, M.D.   ASSISTANT:  None.   ANESTHESIA:  General endotracheal.   ESTIMATED BLOOD LOSS:  Minimal.   INDICATIONS:  Brianna Mitchell is a 55 year old patient with a full-thickness  rotator cuff tear, who presents now for operative management after  failure of conservative therapy.   OPERATIVE FINDINGS:  1. Examination under anesthesia:  Range of motion:  External rotation      to 50 degrees, abduction to 70.  The patient has full forward      flexion and good shoulder stability.  2. Diagnostic and operative arthroscopy:  (a) fraying of the labrum,      anterior portion, with stability of the biceps anchor and no      tearing of the biceps tendon.  (b) Rotator cuff tear measuring 1.5      x 1.5 cm.  (c) Significant bursitis.  (d) Intact glenohumeral      articular surfaces.   PROCEDURE DETAILS:  The patient was brought to the operating room, where  general endotracheal anesthesia as well as preoperative antibiotics were  administered.  The patient was placed in the supine position with the  head in neutral position and the right arm well padded.  The left  shoulder, arm and hand were prepped with DuraPrep solution and draped in  a sterile manner.   Topographic anatomy of the shoulder was identified,  including the possible and margins of the acromion, as well as the  coracoid process.  A solution of saline and epinephrine was injected  into the subacromial space.  Saline was then injected into the joint and  the posterior portal was created 2 cm medial and inferior to the  posterolateral margin of the acromion.  The scope was placed into the  glenohumeral space.  The anterior portal was created under direct  visualization.  Limited debridement of the torn, nonstable labral tissue  was performed.  Mild synovitis was present.  The rotator cuff tear was  also debrided.  The rotator cuff was full thickness, measuring 1.5 x 2  cm.  At this time, the scope was placed in the subacromial space.  Subacromial decompression with release of the CA ligament was performed.  Some planing of the clavicle was required.  Bursectomy was performed.  At this time, the anterior and posterior portals were closed.  Reduraprepping was  performed and the operative field was covered with  [place].  An incision was made, extending the lateral portal distally and  proximally.  The skin and subcutaneous tissues were sharply divided, and  a stay suture was placed at the apex of the deltoid split 3 cm from the  anterolateral margin of the acromion.  The deltoid was split.  The  rotator cuff tear was identified and mobilized.  The bone was prepared  with a curet.  Devitalized rotator cuff tissue was debrided from the V/U-  shaped tear.  The tear was then reapproximated using interrupted,  inverted 2-0 FiberWire suture.  The reapproximated U-shaped tear was  then matted down to its native footprint using two 5.5 Bio-corkscrew  anchors and two 4.5 push-locks with a secure watertight repair achieved.  The arm was taken through a range of motion.  No grinding was noted.  The incision was thoroughly irrigated.  The deltoid split was closed  using 0 Vicryl suture, followed  by interrupted 2-0 Vicryl suture  reapproximating the skin edges, followed by interrupted 3-0 Prolene.  A  bulky dressing was applied.  The patient tolerated the procedure well.      Burnard Bunting, M.D.  Electronically Signed     GSD/MEDQ  D:  04/19/2006  T:  04/19/2006  Job:  [id number]

## 2010-07-03 NOTE — Discharge Summary (Signed)
NAMETYNA, HUERTAS NO.:  [id number]   MEDICAL RECORD NO.:  [id number]          PATIENT TYPE:  INP   LOCATION:  1610                         FACILITY:  [facility]   PHYSICIAN:  Burnard Bunting, M.D.    DATE OF BIRTH:  11-18-1955   DATE OF ADMISSION:  04/19/2006  DATE OF DISCHARGE:  04/21/2006                               DISCHARGE SUMMARY   DISCHARGE DIAGNOSES:  Left rotator cuff tear of the shoulder.   SECONDARY DIAGNOSES:  1. History of lumbar spine surgery.  2. Diabetes.  3. Asthma.  4. Reflux disease.  5. Thyroid disease.  6. Depression.   OPERATION/PROCEDURE:  Left shoulder rotator cuff tear repair and  subacromial decompression performed April 19, 2006.   HOSPITAL COURSE:  Brianna Mitchell is a 55 year old patient with refractory  shoulder pain who presents for operative management.  She underwent a  rotator cuff repair and subacromial decompression on April 19, 2006.  She  tolerated the procedure well without immediate complications.  She had  trouble voiding postop and required Foley catheterization and was  diffused.  Therapy was started for shoulder range of motion and  exercising.  The patient was voiding by postop day #2, April 21, 2006,  and was discharged in good condition with a sling and home CTM machine.   DISCHARGE MEDICATIONS:  . Include:  1. Percocet 1 to 2 p.o. every 3-4 hours p.r.n. pain.  2. As well as her preadmission medications which is Lantus insulin 80      units b.i.d.  3. Humalog sliding scale b.i.d.  4. Metformin 500 one p.o. b.i.d.  5. Levoxyl 50 mcg one daily.  6. Cymbalta 60 mg p.o. daily.  7. Zetia 10 mg p.o. daily.  8. Prevacid 30 daily.  9. Singulair 10 mg once p.o. daily.  10.Cyclobenzaprine 10 mg once daily.  11.Topamax 25 three nightly.  12.Zocor 20 mg one everyday.   FOLLOWUP:  She will see me back in 7 days for suture removal.  Continue  to do home exercises.      Burnard Bunting, M.D.  Electronically  Signed     GSD/MEDQ  D:  06/15/2006  T:  06/15/2006  Job:  [id number]

## 2010-07-03 NOTE — Op Note (Signed)
NAMEJOHNNIE, MOTEN NO.:  [id number]   MEDICAL RECORD NO.:  [id number]          PATIENT TYPE:  INP   LOCATION:  1610                         FACILITY:  [facility]   PHYSICIAN:  Burnard Bunting, M.D.    DATE OF BIRTH:  [birth year]   DATE OF PROCEDURE:  06/15/2005  DATE OF DISCHARGE:                                 OPERATIVE REPORT   PREOPERATIVE DIAGNOSES:  1.  Right shoulder impingement,  2.  Bursitis and,  3.  Synovitis.   POSTOPERATIVE DIAGNOSES:  1.  Right shoulder impingement,  2.  Bursitis; and,  3.  Synovitis.   OPERATION PERFORMED:  1.  Right shoulder diagnostic operative arthroscopy with symptom debridement      of inflamed synovium and degenerated labrum.  2.  Subacromial decompression.   SURGEON:  Burnard Bunting, M.D.   ASSISTANT:  None.   ANESTHESIA:  General endotracheal.   ESTIMATED BLOOD LOSS:  The estimated blood loss was none.   INDICATIONS FOR THE SURGERY:  Ms. Casilda Pickerill is a 55 year old female with  impingement bursitis refractory to nonoperative measures.   INTRAOPERATIVE FINDINGS:  1.  Examination under anesthesia - range of motion; the patient has full      forward flexion.  Full abduction.  Excellent shoulder stability with      less than 1+ __________  instability and less than 1 cm sulcus sign.   1.  Diagnostic operative arthroscopy.      1.  Synovitis around the biceps anchor and within the rotator cuff          tendon around the biceps anchor as well as within the rotator          interval.      2.  Intact rotator cuff.      3.  Intact glenohumeral articular surfaces.      4.  Stable biceps anchor with type SLAP and degeneration around the          anchor   1.  Significant impingement bursitis.   DESCRIPTION OF THE OPERATION:  The patient was brought to the operating room  where general endotracheal anesthesia was induced.  Prophylactic antibiotics  were administered.  The entire extremity was prepped and draped with  DuraPrep solution including the hands and fingers, and draped in a sterile  manner.  __________  of the shoulder was identified including the fascia  lata and the anterior of the acromion.  A solution of saline and epinephrine  was injected in the subacromial space, and a solution of saline was injected  in the glenohumeral joint.  Diagnostic arthroscopy was performed through a  stab incision in the posterior furrow 2 cm medial and inferior to the  __________  margin of the acromion.   An anterior portal was then created under direct visualization.  Diagnostic  arthroscopy was performed.  Synovitis was present within the rotator  interval and on the undersurface of the rotator cuff near the glenoid rim  superiorly.  A type 1 degenerative labral tear was also present.  Both these  areas were  extensively debrided with a shaver.  The substance of the rotator  cuff tendon was left intact.  The biceps anchor was stable to ramp test.  The anteroinferior and posteroinferior glenohumeral ligaments were intact.  The patient did have a Buford complex middle glenohumeral ligament.   The scope was then placed in to the subacromial space.  Lateral portal was  created.  A bursectomy was performed.  The coracoacromial ligament was  released.  A bur was used to remove the hook from the acromion.  At this  time the subacromial space was well decompressed.   The instruments were then removed from their portals.  After irrigation of  the shoulder joint the portals were closed using  3-0 nylon sutures.  Marcaine and morphine were placed into the subacromial space.   The patient tolerated the procedure well without immediate complications.           ______________________________  G. Dorene Grebe, M.D.     GSD/MEDQ  D:  06/15/2005  T:  06/16/2005  Job:  [id number]

## 2010-11-06 LAB — I-STAT 8, (EC8 V) (CONVERTED LAB)
Acid-Base Excess: 3 — ABNORMAL HIGH
BUN: 18
Bicarbonate: 29.1 — ABNORMAL HIGH
Chloride: 102
Glucose, Bld: 128 — ABNORMAL HIGH
HCT: 40
Hemoglobin: 13.6
Operator id: 294501
Potassium: 3.6
Sodium: 138
TCO2: 31
pCO2, Ven: 49.7
pH, Ven: 7.376 — ABNORMAL HIGH

## 2010-11-06 LAB — DIFFERENTIAL
Basophils Absolute: 0
Basophils Relative: 0
Eosinophils Absolute: 0
Eosinophils Relative: 0
Lymphocytes Relative: 39
Lymphs Abs: 2.9
Monocytes Absolute: 0.5
Monocytes Relative: 6
Neutro Abs: 4
Neutrophils Relative %: 54

## 2010-11-06 LAB — CBC
HCT: 35.4 — ABNORMAL LOW
Hemoglobin: 11.6 — ABNORMAL LOW
MCHC: 32.9
MCV: 84.2
Platelets: 279
RBC: 4.2
RDW: 14.8
WBC: 7.3

## 2010-11-06 LAB — POCT I-STAT CREATININE
Creatinine, Ser: 1
Operator id: 294501

## 2010-11-06 LAB — RAPID STREP SCREEN (MED CTR MEBANE ONLY): Streptococcus, Group A Screen (Direct): NEGATIVE

## 2011-04-03 ENCOUNTER — Emergency Department (HOSPITAL_COMMUNITY)
Admission: EM | Admit: 2011-04-03 | Discharge: 2011-04-03 | Disposition: A | Discharge status: Home / Self Care | Payer: Medicare Other | Attending: Emergency Medicine | Admitting: Emergency Medicine

## 2011-04-03 ENCOUNTER — Encounter (HOSPITAL_COMMUNITY): Payer: Self-pay | Admitting: Emergency Medicine

## 2011-04-03 DIAGNOSIS — S0500XA Injury of conjunctiva and corneal abrasion without foreign body, unspecified eye, initial encounter: Secondary | ICD-10-CM

## 2011-04-03 DIAGNOSIS — X58XXXA Exposure to other specified factors, initial encounter: Secondary | ICD-10-CM | POA: Insufficient documentation

## 2011-04-03 DIAGNOSIS — H5789 Other specified disorders of eye and adnexa: Secondary | ICD-10-CM | POA: Insufficient documentation

## 2011-04-03 DIAGNOSIS — Z794 Long term (current) use of insulin: Secondary | ICD-10-CM | POA: Insufficient documentation

## 2011-04-03 DIAGNOSIS — S058X9A Other injuries of unspecified eye and orbit, initial encounter: Secondary | ICD-10-CM | POA: Insufficient documentation

## 2011-04-03 DIAGNOSIS — Z79899 Other long term (current) drug therapy: Secondary | ICD-10-CM | POA: Insufficient documentation

## 2011-04-03 DIAGNOSIS — H5710 Ocular pain, unspecified eye: Secondary | ICD-10-CM | POA: Insufficient documentation

## 2011-04-03 DIAGNOSIS — E119 Type 2 diabetes mellitus without complications: Secondary | ICD-10-CM | POA: Insufficient documentation

## 2011-04-03 DIAGNOSIS — H11419 Vascular abnormalities of conjunctiva, unspecified eye: Secondary | ICD-10-CM | POA: Insufficient documentation

## 2011-04-03 DIAGNOSIS — I10 Essential (primary) hypertension: Secondary | ICD-10-CM | POA: Insufficient documentation

## 2011-04-03 DIAGNOSIS — J45909 Unspecified asthma, uncomplicated: Secondary | ICD-10-CM | POA: Insufficient documentation

## 2011-04-03 HISTORY — DX: Lateral epicondylitis, unspecified elbow: M77.10

## 2011-04-03 HISTORY — DX: Ganglion, unspecified site: M67.40

## 2011-04-03 HISTORY — DX: Pain in unspecified shoulder: M25.519

## 2011-04-03 HISTORY — DX: Dorsalgia, unspecified: M54.9

## 2011-04-03 HISTORY — DX: Benign neoplasm of connective and other soft tissue, unspecified: D21.9

## 2011-04-03 HISTORY — DX: Disorder of thyroid, unspecified: E07.9

## 2011-04-03 HISTORY — DX: Essential (primary) hypertension: I10

## 2011-04-03 HISTORY — DX: Carpal tunnel syndrome, unspecified upper limb: G56.00

## 2011-04-03 MED ORDER — OXYCODONE-ACETAMINOPHEN 5-325 MG PO TABS
1.0000 | ORAL_TABLET | Freq: Once | ORAL | Status: AC
  Administered 2011-04-03: 1 via ORAL
  Filled 2011-04-03: qty 1

## 2011-04-03 MED ORDER — ERYTHROMYCIN 5 MG/GM OP OINT
TOPICAL_OINTMENT | Freq: Once | OPHTHALMIC | Status: AC
  Administered 2011-04-03: 15:00:00 via OPHTHALMIC
  Filled 2011-04-03: qty 1

## 2011-04-03 MED ORDER — FLUORESCEIN SODIUM 1 MG OP STRP
1.0000 | ORAL_STRIP | Freq: Once | OPHTHALMIC | Status: AC
  Administered 2011-04-03: 1 via OPHTHALMIC
  Filled 2011-04-03: qty 1

## 2011-04-03 MED ORDER — OXYCODONE-ACETAMINOPHEN 5-325 MG PO TABS: 1.0000 | ORAL_TABLET | ORAL | Status: AC | PRN

## 2011-04-03 MED ORDER — TETRACAINE HCL 0.5 % OP SOLN
2.0000 [drp] | Freq: Once | OPHTHALMIC | Status: AC
  Administered 2011-04-03: 2 [drp] via OPHTHALMIC

## 2011-04-03 MED ORDER — TETRACAINE HCL 0.5 % OP SOLN
OPHTHALMIC | Status: AC
  Administered 2011-04-03: 2 [drp] via OPHTHALMIC
  Filled 2011-04-03: qty 2

## 2011-04-03 NOTE — ED Provider Notes (Signed)
History     CSN: [id number]  Arrival date & time 04/03/11  1141   First MD Initiated Contact with Patient 04/03/11 1213      Chief Complaint  Patient presents with  . Eye Pain    Right eye    (Consider location/radiation/quality/duration/timing/severity/associated sxs/prior treatment) Patient is a 55 y.o. female presenting with eye pain. The history is provided by the patient. No language interpreter was used.  Eye Pain This is a new problem. The current episode started yesterday. The problem occurs constantly. The problem has been gradually worsening. Pertinent negatives include no fever, visual change or vomiting. Exacerbated by: blinking. She has tried nothing for the symptoms.   56 year old patient coming in today.  Past Medical History  Diagnosis Date  . Diabetes mellitus   . Asthma   . Hypertension   . Thyroid disease   . Ganglion cyst   . Fibroid tumor   . Carpal tunnel syndrome   . Back pain   . Shoulder pain   . Tennis elbow     Past Surgical History  Procedure Date  . Tubal ligation   . Abdominal hysterectomy   . Pelvic bone removed   . Rotator cuff repair   . Cholecystectomy     History reviewed. No pertinent family history.  History  Substance Use Topics  . Smoking status: Never Smoker   . Smokeless tobacco: Not on file  . Alcohol Use: No    OB History    Grav Para Term Preterm Abortions TAB SAB Ect Mult Living                  Review of Systems  Constitutional: Negative for fever.  Eyes: Positive for pain.  Gastrointestinal: Negative for vomiting.  All other systems reviewed and are negative.    Allergies  Aspirin; Sulfa antibiotics; and Demerol  Home Medications   Current Outpatient Rx  Name Route Sig Dispense Refill  . ALBUTEROL SULFATE HFA 108 (90 BASE) MCG/ACT IN AERS Inhalation Inhale 2 puffs into the lungs every 6 (six) hours as needed. For shortness of breath    . ALBUTEROL SULFATE (2.5 MG/3ML) 0.083% IN NEBU Nebulization  Take 2.5 mg by nebulization every 6 (six) hours as needed. For shortness of breath    . BECLOMETHASONE DIPROPIONATE 80 MCG/ACT IN AERS Inhalation Inhale 1 puff into the lungs daily with lunch.    . CLOPIDOGREL BISULFATE 75 MG PO TABS Oral Take 75 mg by mouth daily.    . CYCLOBENZAPRINE HCL 10 MG PO TABS Oral Take 10 mg by mouth 3 (three) times daily as needed. For muscle spasms    . INSULIN LISPRO (HUMAN) 100 UNIT/ML [place] SOLN Subcutaneous Inject 25-35 Units into the skin 2 (two) times daily. 35 units at 10am and 25 units at 10pm    . INSULIN ISOPHANE HUMAN 100 UNIT/ML [place] SUSP Subcutaneous Inject 0-35 Units into the skin 2 (two) times daily. On sliding scale:  125 nothing, 200 17 units, 400 35 units    . LEVOTHYROXINE SODIUM 75 MCG PO TABS Oral Take 75 mcg by mouth daily.    Marland Kitchen NAPROXEN SODIUM 220 MG PO TABS Oral Take 220 mg by mouth 2 (two) times daily with a meal. For migraines    . OLMESARTAN MEDOXOMIL 20 MG PO TABS Oral Take 20 mg by mouth daily.    . OXYCODONE-ACETAMINOPHEN 10-325 MG PO TABS Oral Take 1 tablet by mouth every 12 (twelve) hours as needed. For pain    .  OXYMORPHONE HCL ER 20 MG PO TB12 Oral Take 20 mg by mouth every 12 (twelve) hours. For back pain    . PANTOPRAZOLE SODIUM 40 MG PO TBEC Oral Take 40 mg by mouth daily.    Marland Kitchen ROSUVASTATIN CALCIUM 20 MG PO TABS Oral Take 20 mg by mouth daily.    . TOPIRAMATE 25 MG PO CPSP Oral Take 25 mg by mouth daily.      BP 121/103  Pulse 92  Temp(Src) 97.6 F (36.4 C) (Oral)  Resp 18  SpO2 98%  Physical Exam  Nursing note and vitals reviewed. Constitutional: She is oriented to person, place, and time. She appears well-developed and well-nourished.  HENT:  Head: Normocephalic and atraumatic.  Eyes: EOM are normal. Pupils are equal, round, and reactive to light. Right eye exhibits discharge. Right eye exhibits no exudate and no hordeolum. No foreign body present in the right eye. Left eye exhibits no discharge and no exudate. Right  conjunctiva is injected. Right conjunctiva has no hemorrhage. Left conjunctiva is not injected. Left conjunctiva has no hemorrhage. No scleral icterus.  Neck: Normal range of motion. Neck supple.  Cardiovascular: Normal rate, regular rhythm, normal heart sounds and intact distal pulses.  Exam reveals no gallop and no friction rub.   No murmur heard. Pulmonary/Chest: Effort normal and breath sounds normal.  Abdominal: Soft. Bowel sounds are normal.  Musculoskeletal: Normal range of motion. She exhibits no edema and no tenderness.  Neurological: She is alert and oriented to person, place, and time. She has normal reflexes.  Skin: Skin is warm and dry.  Psychiatric: She has a normal mood and affect.    ED Course  Procedures (including critical care time)  Labs Reviewed - No data to display No results found.   No diagnosis found.    MDM  Corneal abrasion since last pm.  Treated with erythromycin ointment. Opthamologist follow up.        Jethro Bastos, NP 04/03/11 [phone]

## 2011-04-03 NOTE — ED Notes (Signed)
Provider at bedside

## 2011-04-03 NOTE — ED Notes (Signed)
Pt c/o right sided eye pain. Pt states "pain feels like it's inside my eye." Pt states she has been having clear drainage. No drainage noted by this RN. Pt falling asleep in chair.

## 2011-04-03 NOTE — ED Notes (Signed)
Pt reports right eye pain onset today. Pt denies recent injury.

## 2011-04-03 NOTE — Discharge Instructions (Signed)
Follow up with Dr. Spero Curb  [address] [place] [address]. Use  the erythromycin ointment to your right side twice a day x4-5 days. Return for severe pain or vision problems.   Corneal Abrasion The cornea is the clear covering at the front and center of the eye. It is a thin tissue made up of layers. The top layer is the most sensitive layer. A corneal abrasion happens if this layer is scratched or an injury causes it to come off.  HOME CARE  You may be given drops or a medicated cream. Use the medicine as told by your doctor.   A pressure patch may be put over the eye. If this is done, follow your doctor's instructions for when to remove the patch. Do not drive or use machines while the eye patch is on. Judging distances is hard to do with a patch on.   See your doctor for a follow-up exam if you are told to do so.  GET HELP RIGHT AWAY IF:   The pain is getting worse or is very bad.   The eye is very sensitive to light.   Any liquid comes out of the injured eye after treatment.   Your vision suddenly gets worse.   You have a sudden loss of vision or blindness.  MAKE SURE YOU:   Understand these instructions.   Will watch your condition.   Will get help right away if you are not doing well or get worse.  Document Released: 07/21/2007 Document Revised: 10/14/2010 Document Reviewed: 07/21/2007 [facility] Patient Information 2012 [place], [place].

## 2011-04-03 NOTE — ED Notes (Signed)
Pt d/c home in NAD. Pt given pain meds before leaving. Pt has ride. Pt voiced understanding of d/c instructions

## 2011-04-03 NOTE — ED Provider Notes (Signed)
Medical screening examination/treatment/procedure(s) were performed by non-physician practitioner and as supervising physician I was immediately available for consultation/collaboration.  Hurman Horn, MD 04/03/11 2133

## 2011-04-16 ENCOUNTER — Encounter (HOSPITAL_COMMUNITY): Payer: Self-pay | Admitting: Pharmacy Technician

## 2011-04-27 ENCOUNTER — Ambulatory Visit (HOSPITAL_COMMUNITY)
Admission: RE | Admit: 2011-04-27 | Discharge: 2011-04-27 | Disposition: A | Discharge status: Home / Self Care | Payer: PRIVATE HEALTH INSURANCE | Source: Ambulatory Visit | Attending: Cardiology | Admitting: Cardiology

## 2011-04-27 ENCOUNTER — Encounter (HOSPITAL_COMMUNITY)
Admission: RE | Disposition: A | Discharge status: Home / Self Care | Payer: Self-pay | Source: Ambulatory Visit | Attending: Cardiology

## 2011-04-27 DIAGNOSIS — I10 Essential (primary) hypertension: Secondary | ICD-10-CM | POA: Insufficient documentation

## 2011-04-27 DIAGNOSIS — R0789 Other chest pain: Secondary | ICD-10-CM | POA: Insufficient documentation

## 2011-04-27 DIAGNOSIS — E119 Type 2 diabetes mellitus without complications: Secondary | ICD-10-CM | POA: Insufficient documentation

## 2011-04-27 HISTORY — PX: LEFT HEART CATHETERIZATION WITH CORONARY ANGIOGRAM: SHX5451

## 2011-04-27 LAB — GLUCOSE, CAPILLARY
Glucose-Capillary: 112 mg/dL — ABNORMAL HIGH (ref 70–99)
Glucose-Capillary: 128 mg/dL — ABNORMAL HIGH (ref 70–99)

## 2011-04-27 SURGERY — LEFT HEART CATHETERIZATION WITH CORONARY ANGIOGRAM
Anesthesia: LOCAL

## 2011-04-27 MED ORDER — LIDOCAINE HCL (PF) 1 % IJ SOLN
INTRAMUSCULAR | Status: AC
  Filled 2011-04-27: qty 30

## 2011-04-27 MED ORDER — CEFAZOLIN SODIUM 1-5 GM-% IV SOLN
INTRAVENOUS | Status: AC
  Filled 2011-04-27: qty 50

## 2011-04-27 MED ORDER — VERAPAMIL HCL 2.5 MG/ML IV SOLN
INTRAVENOUS | Status: AC
  Filled 2011-04-27: qty 2

## 2011-04-27 MED ORDER — SODIUM CHLORIDE 0.9 % IV SOLN: INTRAVENOUS | Status: AC

## 2011-04-27 MED ORDER — SODIUM CHLORIDE 0.9 % IV SOLN: 250.0000 mL | INTRAVENOUS | Status: DC | PRN

## 2011-04-27 MED ORDER — SODIUM CHLORIDE 0.9 % IJ SOLN: 3.0000 mL | INTRAMUSCULAR | Status: DC | PRN

## 2011-04-27 MED ORDER — HYDROMORPHONE HCL PF 2 MG/ML IJ SOLN
0.5000 mg | INTRAMUSCULAR | Status: DC | PRN
  Administered 2011-04-27: 0.5 mg via INTRAVENOUS

## 2011-04-27 MED ORDER — HYDROMORPHONE HCL PF 2 MG/ML IJ SOLN
INTRAMUSCULAR | Status: AC
  Filled 2011-04-27: qty 1

## 2011-04-27 MED ORDER — SODIUM CHLORIDE 0.9 % IV SOLN
INTRAVENOUS | Status: DC
  Administered 2011-04-27: 06:00:00 via INTRAVENOUS

## 2011-04-27 MED ORDER — HEPARIN (PORCINE) IN NACL 2-0.9 UNIT/ML-% IJ SOLN
INTRAMUSCULAR | Status: AC
  Filled 2011-04-27: qty 2000

## 2011-04-27 MED ORDER — SODIUM CHLORIDE 0.9 % IJ SOLN: 3.0000 mL | Freq: Two times a day (BID) | INTRAMUSCULAR | Status: DC

## 2011-04-27 MED ORDER — ONDANSETRON HCL 4 MG/2ML IJ SOLN: 4.0000 mg | Freq: Four times a day (QID) | INTRAMUSCULAR | Status: DC | PRN

## 2011-04-27 MED ORDER — MIDAZOLAM HCL 2 MG/2ML IJ SOLN
INTRAMUSCULAR | Status: AC
  Filled 2011-04-27: qty 2

## 2011-04-27 MED ORDER — NITROGLYCERIN 0.2 MG/ML ON CALL CATH LAB
INTRAVENOUS | Status: AC
  Filled 2011-04-27: qty 1

## 2011-04-27 MED ORDER — ACETAMINOPHEN 325 MG PO TABS: 650.0000 mg | ORAL_TABLET | ORAL | Status: DC | PRN

## 2011-04-27 NOTE — Discharge Instructions (Addendum)
Groin Site Care Refer to this sheet in the next few weeks. These instructions provide you with information on caring for yourself after your procedure. Your caregiver may also give you more specific instructions. Your treatment has been planned according to current medical practices, but problems sometimes occur. Call your caregiver if you have any problems or questions after your procedure. HOME CARE INSTRUCTIONS  You may shower 24 hours after the procedure. Remove the bandage (dressing) and gently wash the site with plain soap and water. Gently pat the site dry.   Do not apply powder or lotion to the site.   Do not sit in a bathtub, swimming pool, or whirlpool for 5 to 7 days.   No bending, squatting, or lifting anything over 10 pounds (4.5 kg) as directed by your caregiver.   Inspect the site at least twice daily.   Do not drive home if you are discharged the same day of the procedure. Have someone else drive you.   You may drive 24 hours after the procedure unless otherwise instructed by your caregiver.  What to expect:  Any bruising will usually fade within 1 to 2 weeks.   Blood that collects in the tissue (hematoma) may be painful to the touch. It should usually decrease in size and tenderness within 1 to 2 weeks.  SEEK IMMEDIATE MEDICAL CARE IF:  You have unusual pain at the groin site or down the affected leg.   You have redness, warmth, swelling, or pain at the groin site.   You have drainage (other than a small amount of blood on the dressing).   You have chills.   You have a fever or persistent symptoms for more than 72 hours.   You have a fever and your symptoms suddenly get worse.   Your leg becomes pale, cool, tingly, or numb.   You have heavy bleeding from the site. Hold pressure on the site.  Document Released: 03/06/2010 Document Revised: 01/21/2011 Document Reviewed: 03/06/2010 [facility] Patient Information 2012 [place], [place]    .Radial Site  Care Refer to this sheet in the next few weeks. These instructions provide you with information on caring for yourself after your procedure. Your caregiver may also give you more specific instructions. Your treatment has been planned according to current medical practices, but problems sometimes occur. Call your caregiver if you have any problems or questions after your procedure. HOME CARE INSTRUCTIONS  You may shower the day after the procedure.Remove the bandage (dressing) and gently wash the site with plain soap and water.Gently pat the site dry.   Do not apply powder or lotion to the site.   Do not submerge the affected site in water for 3 to 5 days.   Inspect the site at least twice daily.   Do not flex or bend the affected arm for 24 hours.   No lifting over 5 pounds (2.3 kg) for 5 days after your procedure.   Do not drive home if you are discharged the same day of the procedure. Have someone else drive you.   You may drive 24 hours after the procedure unless otherwise instructed by your caregiver.   Do not operate machinery or power tools for 24 hours.   A responsible adult should be with you for the first 24 hours after you arrive home.  What to expect:  Any bruising will usually fade within 1 to 2 weeks.   Blood that collects in the tissue (hematoma) may be painful to  the touch. It should usually decrease in size and tenderness within 1 to 2 weeks.  SEEK IMMEDIATE MEDICAL CARE IF:  You have unusual pain at the radial site.   You have redness, warmth, swelling, or pain at the radial site.   You have drainage (other than a small amount of blood on the dressing).   You have chills.   You have a fever or persistent symptoms for more than 72 hours.   You have a fever and your symptoms suddenly get worse.   Your arm becomes pale, cool, tingly, or numb.   You have heavy bleeding from the site. Hold pressure on the site.  Document Released: 03/06/2010 Document  Revised: 01/21/2011 Document Reviewed: 03/06/2010 [facility] Patient Information 2012 [place], [place].

## 2011-04-27 NOTE — H&P (Signed)
  Please see paper chart  

## 2011-04-27 NOTE — CV Procedure (Addendum)
Indication: Chest pain, DM, HTN.  Femoral access:   HEMODYNAMIC DATA: The left ventricle pressure was 119/11/17 EDP. Aortic pressure was  116/73 with a mean of 94 mmHg.  There was no pressure gradient across the aortic valve   Right coronary artery: Right coronary is a large caliber vessel and a  dominant vessel. It is tortuous. It is smooth and normal.   Left main coronary artery: Left main coronary artery is a large caliber  vessel, which is smooth and normal.   Circumflex: Circumflex is a moderate caliber vessel, giving origin to a  Large high obtuse marginal 1. Smooth and normal.   Ramus intermediate: NA.   LAD: LAD is a large caliber vessel, giving origin to several small  diagonals. It is smooth and normal.   Left ventriculogram: Left ventriculography revealed ejection fraction  of 55-60%. There was no wall motion abnormality.   IMPRESSIONS:  1. Normal coronary arteries, right dominant circulation. 2. Evaluation for non cardiac chest pain.  TECHNICAL PROCEDURE: Under sterile precautions using a 6-French right  femoral access and a 6-French sheath TIG number 4 catheter which was  advanced into the ascending aorta over a safety J-wire. Left  ventriculography was performed in the RAO projection. The same catheter  was utilized to engage left main coronary artery.  A JR-4used to engage the  right coronary  artery and angiography was performed.  The catheter then  pulled out of body where J-wire. . Hemostasis  was obtained by applying perclose.  Right radial access was initially obtained, but because of inability to cross the radial loop, femoral access obtained.  No immediate complications.  Radial access hemostasis by TR band.

## 2011-09-13 ENCOUNTER — Other Ambulatory Visit: Payer: Self-pay | Admitting: Endocrinology

## 2011-09-13 DIAGNOSIS — E049 Nontoxic goiter, unspecified: Secondary | ICD-10-CM

## 2011-09-21 ENCOUNTER — Ambulatory Visit
Admission: RE | Admit: 2011-09-21 | Discharge: 2011-09-21 | Disposition: A | Discharge status: Home / Self Care | Payer: PRIVATE HEALTH INSURANCE | Source: Ambulatory Visit | Attending: Endocrinology | Admitting: Endocrinology

## 2011-09-21 DIAGNOSIS — E049 Nontoxic goiter, unspecified: Secondary | ICD-10-CM

## 2011-11-21 ENCOUNTER — Emergency Department (HOSPITAL_COMMUNITY): Payer: PRIVATE HEALTH INSURANCE

## 2011-11-21 ENCOUNTER — Encounter (HOSPITAL_COMMUNITY): Payer: Self-pay

## 2011-11-21 ENCOUNTER — Emergency Department (HOSPITAL_COMMUNITY)
Admission: EM | Admit: 2011-11-21 | Discharge: 2011-11-21 | Disposition: A | Discharge status: Home / Self Care | Payer: PRIVATE HEALTH INSURANCE | Attending: Emergency Medicine | Admitting: Emergency Medicine

## 2011-11-21 DIAGNOSIS — R42 Dizziness and giddiness: Secondary | ICD-10-CM | POA: Insufficient documentation

## 2011-11-21 DIAGNOSIS — Z9089 Acquired absence of other organs: Secondary | ICD-10-CM | POA: Insufficient documentation

## 2011-11-21 DIAGNOSIS — J45909 Unspecified asthma, uncomplicated: Secondary | ICD-10-CM | POA: Insufficient documentation

## 2011-11-21 DIAGNOSIS — Z794 Long term (current) use of insulin: Secondary | ICD-10-CM | POA: Insufficient documentation

## 2011-11-21 DIAGNOSIS — Z79899 Other long term (current) drug therapy: Secondary | ICD-10-CM | POA: Insufficient documentation

## 2011-11-21 DIAGNOSIS — W19XXXA Unspecified fall, initial encounter: Secondary | ICD-10-CM

## 2011-11-21 DIAGNOSIS — R079 Chest pain, unspecified: Secondary | ICD-10-CM | POA: Insufficient documentation

## 2011-11-21 DIAGNOSIS — E119 Type 2 diabetes mellitus without complications: Secondary | ICD-10-CM | POA: Insufficient documentation

## 2011-11-21 DIAGNOSIS — R51 Headache: Secondary | ICD-10-CM | POA: Insufficient documentation

## 2011-11-21 DIAGNOSIS — I10 Essential (primary) hypertension: Secondary | ICD-10-CM | POA: Insufficient documentation

## 2011-11-21 LAB — CBC WITH DIFFERENTIAL/PLATELET
Basophils Absolute: 0 10*3/uL (ref 0.0–0.1)
Basophils Relative: 0 % (ref 0–1)
Eosinophils Absolute: 0 10*3/uL (ref 0.0–0.7)
Eosinophils Relative: 0 % (ref 0–5)
HCT: 32.2 % — ABNORMAL LOW (ref 36.0–46.0)
Hemoglobin: 10.5 g/dL — ABNORMAL LOW (ref 12.0–15.0)
Lymphocytes Relative: 46 % (ref 12–46)
Lymphs Abs: 3.3 10*3/uL (ref 0.7–4.0)
MCH: 28.1 pg (ref 26.0–34.0)
MCHC: 32.6 g/dL (ref 30.0–36.0)
MCV: 86.1 fL (ref 78.0–100.0)
Monocytes Absolute: 0.4 10*3/uL (ref 0.1–1.0)
Monocytes Relative: 5 % (ref 3–12)
Neutro Abs: 3.5 10*3/uL (ref 1.7–7.7)
Neutrophils Relative %: 49 % (ref 43–77)
Platelets: 244 10*3/uL (ref 150–400)
RBC: 3.74 MIL/uL — ABNORMAL LOW (ref 3.87–5.11)
RDW: 14.6 % (ref 11.5–15.5)
WBC: 7.1 10*3/uL (ref 4.0–10.5)

## 2011-11-21 LAB — POCT I-STAT, CHEM 8
BUN: 18 mg/dL (ref 6–23)
Calcium, Ion: 1.26 mmol/L — ABNORMAL HIGH (ref 1.12–1.23)
Chloride: 109 mEq/L (ref 96–112)
Creatinine, Ser: 1.3 mg/dL — ABNORMAL HIGH (ref 0.50–1.10)
Glucose, Bld: 163 mg/dL — ABNORMAL HIGH (ref 70–99)
HCT: 35 % — ABNORMAL LOW (ref 36.0–46.0)
Hemoglobin: 11.9 g/dL — ABNORMAL LOW (ref 12.0–15.0)
Potassium: 3.6 mEq/L (ref 3.5–5.1)
Sodium: 144 mEq/L (ref 135–145)
TCO2: 22 mmol/L (ref 0–100)

## 2011-11-21 LAB — POCT I-STAT TROPONIN I: Troponin i, poc: 0 ng/mL (ref 0.00–0.08)

## 2011-11-21 LAB — ETHANOL: Alcohol, Ethyl (B): 172 mg/dL — ABNORMAL HIGH (ref 0–11)

## 2011-11-21 NOTE — ED Notes (Signed)
Per EMS: Pt complains of chest pain x 1 1/2 months. Denies SOB. Reports fall earlier this morning. ETOH on board. Reports Hxt of chronic back pain and DM. A.O. X 4. Ambulatory. NAD. Boyfriend at bedside.
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
## 2011-11-21 NOTE — ED Notes (Signed)
Pt returned from CT °
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
## 2011-11-21 NOTE — ED Notes (Signed)
Off the floor for radiology.

## 2011-11-21 NOTE — ED Notes (Signed)
CBG 156 

## 2011-11-21 NOTE — ED Provider Notes (Signed)
History     CSN: [id number]  Arrival date & time 11/21/11  0220   First MD Initiated Contact with Patient 11/21/11 0222      Chief Complaint  Patient presents with  . Chest Pain    (Consider location/radiation/quality/duration/timing/severity/associated sxs/prior treatment) Patient is a 56 y.o. female presenting with chest pain. The history is provided by the patient.  Chest Pain The chest pain began more  than 1 month ago. Chest pain occurs constantly. The chest pain is unchanged. Associated with: none. The severity of the pain is severe. The quality of the pain is described as sharp. The pain does not radiate. Pertinent negatives for primary symptoms include no fever, no shortness of breath, no cough and no palpitations.  Pertinent negatives for associated symptoms include no claudication. She tried nothing for the symptoms. Risk factors include obesity.  Pertinent negatives for past medical history include no MI.  Procedure history is negative for cardiac catheterization.     Past Medical History  Diagnosis Date  . Diabetes mellitus   . Asthma   . Hypertension   . Thyroid disease   . Ganglion cyst   . Fibroid tumor   . Carpal tunnel syndrome   . Back pain   . Shoulder pain   . Tennis elbow     Past Surgical History  Procedure Date  . Tubal ligation   . Abdominal hysterectomy   . Pelvic bone removed   . Rotator cuff repair   . Cholecystectomy     History reviewed. No pertinent family history.  History  Substance Use Topics  . Smoking status: Never Smoker   . Smokeless tobacco: Not on file  . Alcohol Use: 1.0 oz/week    2 drink(s) per week    OB History    Grav Para Term Preterm Abortions TAB SAB Ect Mult Living                  Review of Systems  Constitutional: Negative for fever.  Respiratory: Negative for cough, chest tightness and shortness of breath.   Cardiovascular: Positive for chest pain. Negative for palpitations, claudication and leg  swelling.  All other systems reviewed and are negative.    Allergies  Aspirin; Sulfa antibiotics; and Demerol  Home Medications   Current Outpatient Rx  Name Route Sig Dispense Refill  . ALBUTEROL SULFATE HFA 108 (90 BASE) MCG/ACT IN AERS Inhalation Inhale 2 puffs into the lungs every 6 (six) hours as needed. For shortness of breath    . ALBUTEROL SULFATE (2.5 MG/3ML) 0.083% IN NEBU Nebulization Take 2.5 mg by nebulization every 6 (six) hours as needed. For shortness of breath    . BECLOMETHASONE DIPROPIONATE 80 MCG/ACT IN AERS Inhalation Inhale 1 puff into the lungs daily with lunch.    . CYCLOBENZAPRINE HCL 10 MG PO TABS Oral Take 10 mg by mouth 3 (three) times daily as needed. For muscle spasms    . INSULIN LISPRO (HUMAN) 100 UNIT/ML [place] SOLN Subcutaneous Inject 0-35 Units into the skin 2 (two) times daily. On sliding scale:  125: nothing, 200: 17 units, 400: 35 units    . INSULIN ISOPHANE HUMAN 100 UNIT/ML [place] SUSP Subcutaneous Inject 25-35 Units into the skin 2 (two) times daily. 35 units at 10am and 25 units at 10pm    . LEVOTHYROXINE SODIUM 75 MCG PO TABS Oral Take 75 mcg by mouth daily.    Marland Kitchen NAPROXEN SODIUM 220 MG PO TABS Oral Take 220 mg by mouth 2 (  two) times daily with a meal. For migraines    . OLMESARTAN MEDOXOMIL 20 MG PO TABS Oral Take 20 mg by mouth daily.    . OXYCODONE-ACETAMINOPHEN 10-325 MG PO TABS Oral Take 1 tablet by mouth every 12 (twelve) hours as needed. For pain    . OXYMORPHONE HCL ER 40 MG PO TB12 Oral Take 40 mg by mouth every 12 (twelve) hours.    Marland Kitchen PANTOPRAZOLE SODIUM 40 MG PO TBEC Oral Take 40 mg by mouth daily.    Marland Kitchen ROSUVASTATIN CALCIUM 40 MG PO TABS Oral Take 40 mg by mouth daily.    . TOPIRAMATE 25 MG PO CPSP Oral Take 25 mg by mouth daily.      BP 114/70  Pulse 84  Temp 98.9 F (37.2 C) (Oral)  Resp 20  SpO2 100%  Physical Exam  Constitutional: She is oriented to person, place, and time. She appears well-developed and well-nourished. No distress.   HENT:  Head: Normocephalic and atraumatic.  Right Ear: No hemotympanum.  Left Ear: No hemotympanum.  Mouth/Throat: Oropharynx is clear and moist.  Eyes: Conjunctivae normal are normal. Pupils are equal, round, and reactive to light.  Neck: Normal range of motion. Neck supple.  Cardiovascular: Normal rate and regular rhythm.   Pulmonary/Chest: Effort normal and breath sounds normal. She has no wheezes. She has no rales. She exhibits no tenderness.  Abdominal: Soft. Bowel sounds are normal. There is no tenderness. There is no rebound and no guarding.  Musculoskeletal: Normal range of motion. She exhibits no edema and no tenderness.       No snuff box tenderness of either wrist.  Negative anterior and posterior drawer test of B knees.    Neurological: She is alert and oriented to person, place, and time. She has normal reflexes. No cranial nerve deficit.  Skin: Skin is warm and dry.  Psychiatric: She has a normal mood and affect.    ED Course  Procedures (including critical care time)  Labs Reviewed  CBC WITH DIFFERENTIAL - Abnormal; Notable for the following:    RBC 3.74 (*)     Hemoglobin 10.5 (*)     HCT 32.2 (*)     All other components within normal limits  POCT I-STAT, CHEM 8 - Abnormal; Notable for the following:    Creatinine, Ser 1.30 (*)     Glucose, Bld 163 (*)     Calcium, Ion 1.26 (*)     Hemoglobin 11.9 (*)     HCT 35.0 (*)     All other components within normal limits  POCT I-STAT TROPONIN I  ETHANOL   No results found.   No diagnosis found.    MDM   Date: 11/21/2011  Rate: 80  Rhythm: normal sinus rhythm  QRS Axis: normal  Intervals: normal  ST/T Wave abnormalities: normal  Conduction Disutrbances: none  Narrative Interpretation: unremarkable     Greater than 8 hours of pain, actually > 1 month of continuous pain with one negative ekg and one negative troponin is sufficient to exclude ACS.  Suspect fall related to pain medication mixed with  alcohol     Kjersten Ormiston K Sabrena Gavitt-Rasch, MD 11/21/11 [phone]

## 2011-11-21 NOTE — ED Notes (Signed)
Off th

## 2011-11-22 LAB — GLUCOSE, CAPILLARY: Glucose-Capillary: 156 mg/dL — ABNORMAL HIGH (ref 70–99)

## 2012-11-01 ENCOUNTER — Other Ambulatory Visit: Payer: Self-pay | Admitting: Family

## 2012-11-01 ENCOUNTER — Ambulatory Visit
Admission: RE | Admit: 2012-11-01 | Discharge: 2012-11-01 | Disposition: A | Discharge status: Home / Self Care | Payer: PRIVATE HEALTH INSURANCE | Source: Ambulatory Visit | Attending: Family | Admitting: Family

## 2012-11-01 DIAGNOSIS — R079 Chest pain, unspecified: Secondary | ICD-10-CM

## 2012-11-01 DIAGNOSIS — R0602 Shortness of breath: Secondary | ICD-10-CM

## 2012-12-19 NOTE — H&P (Signed)
History of Present Illness The patient is a 57 year old female who presents today for follow up of their back. The patient is being followed for their central back pain. They are now 2 week(s) out from last visit. Symptoms reported today include: pain (mid and low back radiating into bilateral legs and feet), aching, throbbing, stiffness, difficulty ambulating, difficulty arising from chair, weakness and numbness (bilateral legs and feet). The patient states that they are doing poorly. Current treatment includes: activity modification. The following medication has been used for pain control: Oxycodone (and Opana). The patient reports their current pain level to be severe and 10 / 10. The patient presents today following MRI (done on 11/14/2012 here at [facility]).     Subjective Transcription The patient returns for follow up. 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 She has a previous decompression and a solid fusion at L4-5. There is a small foraminal disc extrusion at L5-S1. At this point in time the patient has been under the care of Dr. Jordan Likes has had multiple injections. I still do not have those notes to determine which injections were done.    Allergies Demerol *ANALGESICS - OPIOID* Sulfabenzamide *CHEMICALS* Aspirin *ANALGESICS - NonNarcotic*    Social History Alcohol use. never consumed alcohol Children. 2 Current work status. disabled Drug/Alcohol Rehab (Currently). no Drug/Alcohol Rehab (Previously). no Exercise. Exercises daily; does running / walking Illicit drug use. no Living situation. live alone Marital status. divorced Never smoker Number of flights of stairs before winded. 1 Pain Contract. yes Tobacco / smoke exposure. no Tobacco use. never smoker    Medication History Crestor (20MG  Tablet, 1 Oral  daily) Active. Levothyroxine Sodium ( Tablet, 1 Oral daily) Active. Topiramate (25MG  Tablet, 1 Oral daily) Active. Benicar (20MG  Tablet, 1 Oral daily) Active. ProAir HFA (108 (90 Base)MCG/ACT Aerosol Soln, 2 Inhalation every six hours, as needed) Active. Qvar (80MCG/ACT Aerosol Soln, 1 Inhalation daily) Active. Pantoprazole Sodium (40MG  Tablet DR, 1 Oral daily) Active. HumaLOG KwikPen (100UNIT/ML Soln Pen-inj, 1 Subcutaneous two times daily) Active. HumuLIN N KwikPen (100UNIT/ML Susp Pen-inj, 60-75 units Subcutaneous two times daily) Active. Albuterol Sulfate ((2.5 MG/3ML)0.083% Nebulized Soln, Inhalation every four hours, as needed) Active. Opana ER (40MG  Tab 12HR Deter, 1 Oral two times daily) Active. Oxycodone-Acetaminophen (10-325MG  Tablet, 1 Oral every eight hours) Active. Medications Reconciled.   Objective Transcription(Mosie Angus Sheela Stack, MD; 11/10/2012 10:59 AM)  She is a pleasant woman who appears younger than her stated age. She is alert and oriented times three. No shortness of breath or chest pain. Abdomen is soft and nontender. There is a well healed lumbar incision. No erythema or drainage. Significant back pain with extension. Almost complete relief with forward flexion. There is a negative Babinski test. No clonus. . No hip, knee or ankle pain with joint range of motion. Compartments are soft and nontender. Intact peripheral pulses.  RADIOGRAPHS:  I reviewed the MRI from 04-30-10 which demonstrates significant L3-4 spinal stenosis, severe facet hypertrophy at L3-4. She has an effusion and partial decompression at L4-5, L5-S1 right facet hypertrophy. No spinal or lateral recess stenosis. There are no adverse features status post the decompression and fusion at L4-5.    I did get plain films today in my office which now show a Grade 1, borderline Grade 2 spondylolisthesis at the adjacent segment.   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 The only solution I could have for her would be a lateral interbody fusion at L3-4 with posterior pedicle screw supplementation. I have explained this to the patient, but she is quite leery given the complications. The risks of that include infection, bleeding, nerve damage, death, stoke, paralysis, failure to heal, need for further surgery, ongoing or worse pain, loss of bowel and bladder control, blood clots, adjacent segment disease.  Patient expressed desire to proceed with surgery after considering the risks and benefits.

## 2012-12-21 ENCOUNTER — Encounter (HOSPITAL_COMMUNITY): Payer: Self-pay | Admitting: Pharmacy Technician

## 2012-12-26 ENCOUNTER — Other Ambulatory Visit (HOSPITAL_COMMUNITY): Payer: Self-pay | Admitting: *Deleted

## 2012-12-26 NOTE — Pre-Procedure Instructions (Addendum)
Brianna Mitchell  12/26/2012   Your procedure is scheduled on:  Wednesday, January 03, 2013 at 12:20 PM.   Report to [facility] Entrance "A" at 10:15 AM.   Call this number if you have problems the morning of surgery: [phone]   Remember:   Do not eat food or drink liquids after midnight Tuesday, 01/02/13.   Take these medicines the morning of surgery with A SIP OF WATER: dexlansoprazole (DEXILANT),  levothyroxine (SYNTHROID, LEVOTHROID), oxymorphone (OPANA ER), topiramate (TOPAMAX), ondansetron (ZOFRAN-ODT) - if needed, cyclobenzaprine (FLEXERIL) - if needed, albuterol (PROVENTIL HFA;VENTOLIN HFA) 108 (90 BASE) - bring with you the day of surgery.  Stop all Vitamins, Herbal Medications and NSAIDS (naproxen sodium (ALEVE)) as of today, 12/27/12.  Do not take any of your diabetes medicine the day of surgery.              Do not wear jewelry, make-up or nail polish.  Do not wear lotions, powders, or perfumes. You may wear deodorant.  Do not shave 48 hours prior to surgery.   Do not bring valuables to the hospital.  [facility] is not responsible                  for any belongings or valuables.               Contacts, dentures or bridgework may not be worn into surgery.  Leave suitcase in the car. After surgery it may be brought to your room.  For patients admitted to the hospital, discharge time is determined by your                treatment team.                Special Instructions: Shower using CHG 2 nights before surgery and the night before surgery.  If you shower the day of surgery use CHG.  Use special wash - you have one bottle of CHG for all showers.  You should use approximately 1/3 of the bottle for each shower.   Please read over the following fact sheets that you were given: Pain Booklet, Coughing and Deep Breathing, Blood Transfusion Information, MRSA Information and Surgical Site Infection Prevention

## 2012-12-27 ENCOUNTER — Encounter (HOSPITAL_COMMUNITY): Payer: Self-pay

## 2012-12-27 ENCOUNTER — Encounter (HOSPITAL_COMMUNITY)
Admission: RE | Admit: 2012-12-27 | Discharge: 2012-12-27 | Disposition: A | Discharge status: Home / Self Care | Payer: PRIVATE HEALTH INSURANCE | Source: Ambulatory Visit | Attending: Orthopedic Surgery | Admitting: Orthopedic Surgery

## 2012-12-27 DIAGNOSIS — Z01812 Encounter for preprocedural laboratory examination: Secondary | ICD-10-CM | POA: Insufficient documentation

## 2012-12-27 DIAGNOSIS — Z01818 Encounter for other preprocedural examination: Secondary | ICD-10-CM | POA: Insufficient documentation

## 2012-12-27 DIAGNOSIS — Z01810 Encounter for preprocedural cardiovascular examination: Secondary | ICD-10-CM | POA: Insufficient documentation

## 2012-12-27 HISTORY — DX: Hypothyroidism, unspecified: E03.9

## 2012-12-27 HISTORY — DX: Shortness of breath: R06.02

## 2012-12-27 HISTORY — DX: Headache: R51

## 2012-12-27 LAB — BASIC METABOLIC PANEL
BUN: 22 mg/dL (ref 6–23)
CO2: 22 mEq/L (ref 19–32)
Calcium: 9.3 mg/dL (ref 8.4–10.5)
Chloride: 105 mEq/L (ref 96–112)
Creatinine, Ser: 1.13 mg/dL — ABNORMAL HIGH (ref 0.50–1.10)
GFR calc Af Amer: 61 mL/min — ABNORMAL LOW (ref >90)
GFR calc non Af Amer: 53 mL/min — ABNORMAL LOW (ref >90)
Glucose, Bld: 255 mg/dL — ABNORMAL HIGH (ref 70–99)
Potassium: 3.7 mEq/L (ref 3.5–5.1)
Sodium: 139 mEq/L (ref 135–145)

## 2012-12-27 LAB — SURGICAL PCR SCREEN
MRSA, PCR: NEGATIVE
Staphylococcus aureus: NEGATIVE

## 2012-12-27 LAB — CBC
HCT: 32.8 % — ABNORMAL LOW (ref 36.0–46.0)
Hemoglobin: 10.4 g/dL — ABNORMAL LOW (ref 12.0–15.0)
MCH: 27.9 pg (ref 26.0–34.0)
MCHC: 31.7 g/dL (ref 30.0–36.0)
MCV: 87.9 fL (ref 78.0–100.0)
Platelets: 255 10*3/uL (ref 150–400)
RBC: 3.73 MIL/uL — ABNORMAL LOW (ref 3.87–5.11)
RDW: 13.8 % (ref 11.5–15.5)
WBC: 6.9 10*3/uL (ref 4.0–10.5)

## 2012-12-27 LAB — ABO/RH: ABO/RH(D): O POS

## 2012-12-27 LAB — TYPE AND SCREEN
ABO/RH(D): O POS
Antibody Screen: NEGATIVE

## 2012-12-27 NOTE — Pre-Procedure Instructions (Signed)
ZARRAH LOVELAND  12/27/2012   Your procedure is scheduled on:  01/03/13  Report to [facility]  2 * 3 at 1030 AM.  Call this number if you have problems the morning of surgery: [phone]   Remember:   Do not eat food or drink liquids after midnight.   Take these medicines the morning of surgery with A SIP OF WATER: synthroid,oxycodone,all inhalers   Do not wear jewelry, make-up or nail polish.  Do not wear lotions, powders, or perfumes. You may wear deodorant.  Do not shave 48 hours prior to surgery. Men may shave face and neck.  Do not bring valuables to the hospital.  [facility] is not responsible                  for any belongings or valuables.               Contacts, dentures or bridgework may not be worn into surgery.  Leave suitcase in the car. After surgery it may be brought to your room.  For patients admitted to the hospital, discharge time is determined by your                treatment team.               Patients discharged the day of surgery will not be allowed to drive  home.  Name and phone number of your driver: family  Special Instructions: Shower using CHG 2 nights before surgery and the night before surgery.  If you shower the day of surgery use CHG.  Use special wash - you have one bottle of CHG for all showers.  You should use approximately 1/3 of the bottle for each shower.   Please read over the following fact sheets that you were given: Pain Booklet, Coughing and Deep Breathing, Blood Transfusion Information, MRSA Information and Surgical Site Infection Prevention

## 2012-12-27 NOTE — Pre-Procedure Instructions (Signed)
LUWANNA BROSSMAN  12/27/2012   Your procedure is scheduled on:  01/03/13  Report to [facility]  2 * 3 at 1030 AM.  Call this number if you have problems the morning of surgery: [phone]   Remember:   Do not eat food or drink liquids after midnight.   Take these medicines the morning of surgery with A SIP OF WATER: all inhalers,synthroid,pain medication   Do not wear jewelry, make-up or nail polish.  Do not wear lotions, powders, or perfumes. You may wear deodorant.  Do not shave 48 hours prior to surgery. Men may shave face and neck.  Do not bring valuables to the hospital.  [facility] is not responsible                  for any belongings or valuables.               Contacts, dentures or bridgework may not be worn into surgery.  Leave suitcase in the car. After surgery it may be brought to your room.  For patients admitted to the hospital, discharge time is determined by your                treatment team.               Patients discharged the day of surgery will not be allowed to drive  home.  Name and phone number of your driver: family  Special Instructions: Shower using CHG 2 nights before surgery and the night before surgery.  If you shower the day of surgery use CHG.  Use special wash - you have one bottle of CHG for all showers.  You should use approximately 1/3 of the bottle for each shower.   Please read over the following fact sheets that you were given: Pain Booklet, Coughing and Deep Breathing, Blood Transfusion Information, MRSA Information and Surgical Site Infection Prevention

## 2012-12-28 NOTE — Progress Notes (Signed)
Anesthesia Chart Review:  Patient is a 57 year old female scheduled for spine fusion, lateral and posterior, L3-4 on 01/03/13 by Dr. Shon Baton.  History includes obesity, non-smoker, DM2, asthma, hypothyroidism, fibroids, headaches, HTN, hysterectomy, back surgery, cholecystectomy.    EKG on 12/27/12 showed NSR. She saw Dr. Jacinto Halim in 04/2011 for chest pain and had a cardiac cath on 04/27/11 that snowed normal coronaries, right dominant circulation, EF 55--60%, no wall motion abnormality.  Echo on 12/03/09 showed: - Left ventricle: The cavity size was normal. Wall thickness was increased in a pattern of mild LVH. The estimated ejection fraction was 65%. Wall motion was normal; there were no regional wall motion abnormalities. Left ventricular diastolic function parameters were normal. - Aortic valve: Sclerosis without stenosis.  CXR on 11/01/12 showed no active lung disease, stable borderline cardiomegaly.  Preoperative labs noted.  She will get a CBG on arrival.  If CBG results reasonable and otherwise no acute changes then I would anticipate that she could proceed as planned.  Velna Ochs [facility]/Anesthesiology Phone [phone] 12/28/2012 5:25 PM

## 2013-01-02 MED ORDER — CEFAZOLIN SODIUM-DEXTROSE 2-3 GM-% IV SOLR
2.0000 g | INTRAVENOUS | Status: AC
  Administered 2013-01-03: 2 g via INTRAVENOUS
  Filled 2013-01-02: qty 50

## 2013-01-03 ENCOUNTER — Encounter (HOSPITAL_COMMUNITY): Payer: PRIVATE HEALTH INSURANCE | Admitting: Vascular Surgery

## 2013-01-03 ENCOUNTER — Inpatient Hospital Stay (HOSPITAL_COMMUNITY): Payer: PRIVATE HEALTH INSURANCE | Admitting: Certified Registered Nurse Anesthetist

## 2013-01-03 ENCOUNTER — Inpatient Hospital Stay (HOSPITAL_COMMUNITY)
Admission: RE | Admit: 2013-01-03 | Discharge: 2013-01-06 | DRG: 455 | Disposition: A | Discharge status: Home / Self Care | Payer: PRIVATE HEALTH INSURANCE | Source: Ambulatory Visit | Attending: Orthopedic Surgery | Admitting: Orthopedic Surgery

## 2013-01-03 ENCOUNTER — Inpatient Hospital Stay (HOSPITAL_COMMUNITY): Payer: PRIVATE HEALTH INSURANCE

## 2013-01-03 ENCOUNTER — Encounter (HOSPITAL_COMMUNITY)
Admission: RE | Disposition: A | Discharge status: Home / Self Care | Payer: Self-pay | Source: Ambulatory Visit | Attending: Orthopedic Surgery

## 2013-01-03 ENCOUNTER — Encounter (HOSPITAL_COMMUNITY): Payer: Self-pay | Admitting: Certified Registered Nurse Anesthetist

## 2013-01-03 DIAGNOSIS — E039 Hypothyroidism, unspecified: Secondary | ICD-10-CM | POA: Diagnosis present

## 2013-01-03 DIAGNOSIS — Q762 Congenital spondylolisthesis: Secondary | ICD-10-CM

## 2013-01-03 DIAGNOSIS — E119 Type 2 diabetes mellitus without complications: Secondary | ICD-10-CM | POA: Diagnosis present

## 2013-01-03 DIAGNOSIS — M48061 Spinal stenosis, lumbar region without neurogenic claudication: Principal | ICD-10-CM | POA: Diagnosis present

## 2013-01-03 DIAGNOSIS — I10 Essential (primary) hypertension: Secondary | ICD-10-CM | POA: Diagnosis present

## 2013-01-03 DIAGNOSIS — K5900 Constipation, unspecified: Secondary | ICD-10-CM | POA: Diagnosis not present

## 2013-01-03 DIAGNOSIS — Z981 Arthrodesis status: Secondary | ICD-10-CM

## 2013-01-03 DIAGNOSIS — J45909 Unspecified asthma, uncomplicated: Secondary | ICD-10-CM | POA: Diagnosis present

## 2013-01-03 HISTORY — PX: ANTERIOR LAT LUMBAR FUSION: SHX1168

## 2013-01-03 LAB — GLUCOSE, CAPILLARY
Glucose-Capillary: 214 mg/dL — ABNORMAL HIGH (ref 70–99)
Glucose-Capillary: 172 mg/dL — ABNORMAL HIGH (ref 70–99)
Glucose-Capillary: 280 mg/dL — ABNORMAL HIGH (ref 70–99)

## 2013-01-03 SURGERY — ANTERIOR LATERAL LUMBAR FUSION 1 LEVEL
Anesthesia: General | Site: Spine Lumbar | Wound class: Clean

## 2013-01-03 MED ORDER — ONDANSETRON HCL 4 MG/2ML IJ SOLN: 4.0000 mg | Freq: Four times a day (QID) | INTRAMUSCULAR | Status: DC | PRN

## 2013-01-03 MED ORDER — ALBUTEROL SULFATE (5 MG/ML) 0.5% IN NEBU
2.5000 mg | INHALATION_SOLUTION | Freq: Four times a day (QID) | RESPIRATORY_TRACT | Status: DC
  Administered 2013-01-03 – 2013-01-04 (×3): 2.5 mg via RESPIRATORY_TRACT
  Filled 2013-01-03 (×4): qty 0.5

## 2013-01-03 MED ORDER — GLYCOPYRROLATE 0.2 MG/ML IJ SOLN
INTRAMUSCULAR | Status: DC | PRN
  Administered 2013-01-03: 0.6 mg via INTRAVENOUS

## 2013-01-03 MED ORDER — BUPIVACAINE-EPINEPHRINE 0.25% -1:200000 IJ SOLN
INTRAMUSCULAR | Status: DC | PRN
  Administered 2013-01-03: 10 mL

## 2013-01-03 MED ORDER — MORPHINE SULFATE (PF) 1 MG/ML IV SOLN
INTRAVENOUS | Status: AC
  Filled 2013-01-03: qty 25

## 2013-01-03 MED ORDER — SODIUM CHLORIDE 0.9 % IV SOLN: 250.0000 mL | INTRAVENOUS | Status: DC

## 2013-01-03 MED ORDER — ACETAMINOPHEN 10 MG/ML IV SOLN
INTRAVENOUS | Status: AC
  Administered 2013-01-03: 1000 mg via INTRAVENOUS
  Filled 2013-01-03: qty 100

## 2013-01-03 MED ORDER — LEVOTHYROXINE SODIUM 75 MCG PO TABS
75.0000 ug | ORAL_TABLET | Freq: Every day | ORAL | Status: DC
  Administered 2013-01-04 – 2013-01-06 (×3): 75 ug via ORAL
  Filled 2013-01-03 (×4): qty 1

## 2013-01-03 MED ORDER — ALBUTEROL SULFATE HFA 108 (90 BASE) MCG/ACT IN AERS
2.0000 | INHALATION_SPRAY | Freq: Four times a day (QID) | RESPIRATORY_TRACT | Status: DC | PRN
  Filled 2013-01-03: qty 6.7

## 2013-01-03 MED ORDER — NEOSTIGMINE METHYLSULFATE 1 MG/ML IJ SOLN
INTRAMUSCULAR | Status: DC | PRN
  Administered 2013-01-03: 3 mg via INTRAVENOUS

## 2013-01-03 MED ORDER — TOPIRAMATE 25 MG PO TABS
50.0000 mg | ORAL_TABLET | Freq: Every day | ORAL | Status: DC
  Administered 2013-01-03 – 2013-01-06 (×4): 50 mg via ORAL
  Filled 2013-01-03 (×5): qty 2

## 2013-01-03 MED ORDER — ONDANSETRON HCL 4 MG/2ML IJ SOLN
INTRAMUSCULAR | Status: DC | PRN
  Administered 2013-01-03: 4 mg via INTRAVENOUS

## 2013-01-03 MED ORDER — SODIUM CHLORIDE 0.9 % IJ SOLN: 3.0000 mL | INTRAMUSCULAR | Status: DC | PRN

## 2013-01-03 MED ORDER — OXYCODONE HCL 5 MG PO TABS
5.0000 mg | ORAL_TABLET | Freq: Once | ORAL | Status: DC | PRN
Start: 2013-01-03 — End: 2013-01-03

## 2013-01-03 MED ORDER — NALOXONE HCL 0.4 MG/ML IJ SOLN: 0.4000 mg | INTRAMUSCULAR | Status: DC | PRN

## 2013-01-03 MED ORDER — LACTATED RINGERS IV SOLN
INTRAVENOUS | Status: DC | PRN
  Administered 2013-01-03 (×2): via INTRAVENOUS

## 2013-01-03 MED ORDER — OXYCODONE HCL 5 MG/5ML PO SOLN
5.0000 mg | Freq: Once | ORAL | Status: DC | PRN
Start: 2013-01-03 — End: 2013-01-03

## 2013-01-03 MED ORDER — EPHEDRINE SULFATE 50 MG/ML IJ SOLN
INTRAMUSCULAR | Status: DC | PRN
  Administered 2013-01-03 (×2): 10 mg via INTRAVENOUS
  Administered 2013-01-03: 15 mg via INTRAVENOUS

## 2013-01-03 MED ORDER — THROMBIN 20000 UNITS EX SOLR
CUTANEOUS | Status: DC | PRN
  Administered 2013-01-03: 18:00:00 via TOPICAL

## 2013-01-03 MED ORDER — LIDOCAINE HCL (CARDIAC) 20 MG/ML IV SOLN
INTRAVENOUS | Status: DC | PRN
  Administered 2013-01-03: 80 mg via INTRAVENOUS

## 2013-01-03 MED ORDER — THROMBIN 20000 UNITS EX SOLR
CUTANEOUS | Status: AC
  Filled 2013-01-03: qty 20000

## 2013-01-03 MED ORDER — PHENOL 1.4 % MT LIQD: 1.0000 | OROMUCOSAL | Status: DC | PRN

## 2013-01-03 MED ORDER — ONDANSETRON HCL 4 MG/2ML IJ SOLN
4.0000 mg | INTRAMUSCULAR | Status: DC | PRN
  Administered 2013-01-05: 4 mg via INTRAVENOUS
  Filled 2013-01-03: qty 2

## 2013-01-03 MED ORDER — MORPHINE SULFATE 2 MG/ML IJ SOLN: 1.0000 mg | INTRAMUSCULAR | Status: DC | PRN

## 2013-01-03 MED ORDER — HYDROMORPHONE HCL PF 1 MG/ML IJ SOLN: 0.2500 mg | INTRAMUSCULAR | Status: DC | PRN

## 2013-01-03 MED ORDER — INSULIN NPH (HUMAN) (ISOPHANE) 100 UNIT/ML ~~LOC~~ SUSP
50.0000 [IU] | Freq: Two times a day (BID) | SUBCUTANEOUS | Status: DC
  Administered 2013-01-03 – 2013-01-05 (×4): 50 [IU] via SUBCUTANEOUS
  Filled 2013-01-03 (×3): qty 10

## 2013-01-03 MED ORDER — DEXAMETHASONE SODIUM PHOSPHATE 4 MG/ML IJ SOLN: 4.0000 mg | Freq: Once | INTRAMUSCULAR | Status: DC

## 2013-01-03 MED ORDER — 0.9 % SODIUM CHLORIDE (POUR BTL) OPTIME
TOPICAL | Status: DC | PRN
  Administered 2013-01-03: 1000 mL

## 2013-01-03 MED ORDER — ACETAMINOPHEN 10 MG/ML IV SOLN
1000.0000 mg | Freq: Four times a day (QID) | INTRAVENOUS | Status: AC
  Administered 2013-01-03 – 2013-01-04 (×4): 1000 mg via INTRAVENOUS
  Filled 2013-01-03 (×4): qty 100

## 2013-01-03 MED ORDER — LACTATED RINGERS IV SOLN
INTRAVENOUS | Status: DC
  Administered 2013-01-03: 10:00:00 via INTRAVENOUS

## 2013-01-03 MED ORDER — METHOCARBAMOL 500 MG PO TABS
500.0000 mg | ORAL_TABLET | Freq: Four times a day (QID) | ORAL | Status: DC | PRN
  Administered 2013-01-04 – 2013-01-06 (×4): 500 mg via ORAL
  Filled 2013-01-03 (×4): qty 1

## 2013-01-03 MED ORDER — SODIUM CHLORIDE 0.9 % IJ SOLN: 9.0000 mL | INTRAMUSCULAR | Status: DC | PRN

## 2013-01-03 MED ORDER — MORPHINE SULFATE (PF) 1 MG/ML IV SOLN
INTRAVENOUS | Status: DC
  Administered 2013-01-03 – 2013-01-04 (×2): via INTRAVENOUS
  Filled 2013-01-03: qty 25

## 2013-01-03 MED ORDER — PHENYLEPHRINE HCL 10 MG/ML IJ SOLN
INTRAMUSCULAR | Status: DC | PRN
  Administered 2013-01-03: 120 ug via INTRAVENOUS

## 2013-01-03 MED ORDER — CEFAZOLIN SODIUM 1-5 GM-% IV SOLN
1.0000 g | Freq: Three times a day (TID) | INTRAVENOUS | Status: AC
  Administered 2013-01-03 – 2013-01-04 (×2): 1 g via INTRAVENOUS
  Filled 2013-01-03 (×2): qty 50

## 2013-01-03 MED ORDER — ZOLPIDEM TARTRATE 5 MG PO TABS: 5.0000 mg | ORAL_TABLET | Freq: Every evening | ORAL | Status: DC | PRN

## 2013-01-03 MED ORDER — SODIUM CHLORIDE 0.9 % IV SOLN
INTRAVENOUS | Status: DC | PRN
  Administered 2013-01-03: 16:00:00 via INTRAVENOUS

## 2013-01-03 MED ORDER — DIPHENHYDRAMINE HCL 12.5 MG/5ML PO ELIX: 12.5000 mg | ORAL_SOLUTION | Freq: Four times a day (QID) | ORAL | Status: DC | PRN

## 2013-01-03 MED ORDER — OXYCODONE HCL 5 MG PO TABS
10.0000 mg | ORAL_TABLET | ORAL | Status: DC | PRN
  Administered 2013-01-04 – 2013-01-06 (×5): 10 mg via ORAL
  Filled 2013-01-03 (×5): qty 2

## 2013-01-03 MED ORDER — LACTATED RINGERS IV SOLN
INTRAVENOUS | Status: DC
  Administered 2013-01-03 (×2): via INTRAVENOUS

## 2013-01-03 MED ORDER — FLUTICASONE PROPIONATE HFA 44 MCG/ACT IN AERO
1.0000 | INHALATION_SPRAY | Freq: Two times a day (BID) | RESPIRATORY_TRACT | Status: DC
  Administered 2013-01-04 – 2013-01-06 (×3): 1 via RESPIRATORY_TRACT
  Filled 2013-01-03 (×2): qty 10.6

## 2013-01-03 MED ORDER — SODIUM CHLORIDE 0.9 % IJ SOLN
3.0000 mL | Freq: Two times a day (BID) | INTRAMUSCULAR | Status: DC
  Administered 2013-01-04 – 2013-01-05 (×2): 3 mL via INTRAVENOUS

## 2013-01-03 MED ORDER — DEXAMETHASONE SODIUM PHOSPHATE 4 MG/ML IJ SOLN
INTRAMUSCULAR | Status: AC
  Administered 2013-01-03: 4 mg via INTRAVENOUS
  Filled 2013-01-03: qty 1

## 2013-01-03 MED ORDER — FENTANYL CITRATE 0.05 MG/ML IJ SOLN
INTRAMUSCULAR | Status: DC | PRN
  Administered 2013-01-03 (×3): 50 ug via INTRAVENOUS
  Administered 2013-01-03: 100 ug via INTRAVENOUS
  Administered 2013-01-03 (×2): 50 ug via INTRAVENOUS

## 2013-01-03 MED ORDER — MIDAZOLAM HCL 5 MG/5ML IJ SOLN
INTRAMUSCULAR | Status: DC | PRN
  Administered 2013-01-03: 1 mg via INTRAVENOUS

## 2013-01-03 MED ORDER — DIPHENHYDRAMINE HCL 50 MG/ML IJ SOLN: 12.5000 mg | Freq: Four times a day (QID) | INTRAMUSCULAR | Status: DC | PRN

## 2013-01-03 MED ORDER — ACETAMINOPHEN 10 MG/ML IV SOLN
1000.0000 mg | Freq: Four times a day (QID) | INTRAVENOUS | Status: DC
  Administered 2013-01-03: 1000 mg via INTRAVENOUS

## 2013-01-03 MED ORDER — PROPOFOL 10 MG/ML IV BOLUS
INTRAVENOUS | Status: DC | PRN
  Administered 2013-01-03: 160 mg via INTRAVENOUS

## 2013-01-03 MED ORDER — INSULIN ASPART 100 UNIT/ML ~~LOC~~ SOLN
0.0000 [IU] | SUBCUTANEOUS | Status: DC
  Administered 2013-01-03: 8 [IU] via SUBCUTANEOUS
  Administered 2013-01-04: 5 [IU] via SUBCUTANEOUS
  Administered 2013-01-04: 3 [IU] via SUBCUTANEOUS
  Administered 2013-01-04: 2 [IU] via SUBCUTANEOUS
  Administered 2013-01-05: 3 [IU] via SUBCUTANEOUS
  Administered 2013-01-05 (×3): 5 [IU] via SUBCUTANEOUS
  Administered 2013-01-06 (×2): 3 [IU] via SUBCUTANEOUS

## 2013-01-03 MED ORDER — DEXTROSE 5 % IV SOLN
500.0000 mg | Freq: Four times a day (QID) | INTRAVENOUS | Status: DC | PRN
  Filled 2013-01-03: qty 5

## 2013-01-03 MED ORDER — HYDROMORPHONE HCL PF 1 MG/ML IJ SOLN
INTRAMUSCULAR | Status: AC
  Filled 2013-01-03: qty 1

## 2013-01-03 MED ORDER — MENTHOL 3 MG MT LOZG: 1.0000 | LOZENGE | OROMUCOSAL | Status: DC | PRN

## 2013-01-03 SURGICAL SUPPLY — 82 items
BLADE SURG 10 STRL SS (BLADE) ×2 IMPLANT
BLADE SURG ROTATE 9660 (MISCELLANEOUS) IMPLANT
BONE MATRIX OSTEOCEL PRO MED (Bone Implant) ×4 IMPLANT
BUR EGG ELITE 4.0 (BURR) IMPLANT
CAGE COROENT 12X18X50 (Cage) ×2 IMPLANT
CLOTH BEACON ORANGE TIMEOUT ST (SAFETY) ×2 IMPLANT
CLSR STERI-STRIP ANTIMIC 1/2X4 (GAUZE/BANDAGES/DRESSINGS) ×2 IMPLANT
CORDS BIPOLAR (ELECTRODE) ×2 IMPLANT
COVER MAYO STAND STRL (DRAPES) ×4 IMPLANT
COVER SURGICAL LIGHT HANDLE (MISCELLANEOUS) ×2 IMPLANT
DRAPE C-ARM 42X72 X-RAY (DRAPES) ×2 IMPLANT
DRAPE INCISE IOBAN 66X45 STRL (DRAPES) ×2 IMPLANT
DRAPE ORTHO SPLIT 77X108 STRL (DRAPES) ×1
DRAPE POUCH INSTRU U-SHP 10X18 (DRAPES) ×2 IMPLANT
DRAPE SURG 17X23 STRL (DRAPES) ×2 IMPLANT
DRAPE SURG ORHT 6 SPLT 77X108 (DRAPES) ×1 IMPLANT
DRAPE U-SHAPE 47X51 STRL (DRAPES) ×4 IMPLANT
DRSG MEPILEX BORDER 4X12 (GAUZE/BANDAGES/DRESSINGS) ×2 IMPLANT
DRSG MEPILEX BORDER 4X8 (GAUZE/BANDAGES/DRESSINGS) ×2 IMPLANT
DURAPREP 26ML APPLICATOR (WOUND CARE) ×2 IMPLANT
ELECT BLADE 4.0 EZ CLEAN MEGAD (MISCELLANEOUS) ×2
ELECT BLADE 6.5 EXT (BLADE) IMPLANT
ELECT REM PT RETURN 9FT ADLT (ELECTROSURGICAL) ×2
ELECTRODE BLDE 4.0 EZ CLN MEGD (MISCELLANEOUS) ×1 IMPLANT
ELECTRODE REM PT RTRN 9FT ADLT (ELECTROSURGICAL) ×1 IMPLANT
GAUZE SPONGE 4X4 16PLY XRAY LF (GAUZE/BANDAGES/DRESSINGS) ×2 IMPLANT
GLOVE BIOGEL PI IND STRL 8 (GLOVE) ×1 IMPLANT
GLOVE BIOGEL PI IND STRL 8.5 (GLOVE) ×1 IMPLANT
GLOVE BIOGEL PI INDICATOR 8 (GLOVE) ×1
GLOVE BIOGEL PI INDICATOR 8.5 (GLOVE) ×1
GLOVE ECLIPSE 8.5 STRL (GLOVE) ×2 IMPLANT
GLOVE ORTHO TXT STRL SZ7.5 (GLOVE) ×2 IMPLANT
GOWN PREVENTION PLUS XXLARGE (GOWN DISPOSABLE) ×2 IMPLANT
GOWN STRL NON-REIN LRG LVL3 (GOWN DISPOSABLE) ×2 IMPLANT
GOWN STRL REIN 2XL XLG LVL4 (GOWN DISPOSABLE) ×2 IMPLANT
GOWN STRL REIN XL XLG (GOWN DISPOSABLE) ×4 IMPLANT
GUIDEWIRE NITINOL BEVEL TIP (WIRE) ×2 IMPLANT
K-WIRE MAXCESS 4 13.5 (Wire) ×6 IMPLANT
KIT BASIN OR (CUSTOM PROCEDURE TRAY) ×2 IMPLANT
KIT DILATOR XLIF 5 (KITS) ×1 IMPLANT
KIT MAXCESS (KITS) ×2 IMPLANT
KIT NEEDLE NVM5 EMG ELECT (KITS) ×1 IMPLANT
KIT NEEDLE NVM5 EMG ELECTRODE (KITS) ×1
KIT POSITION SURG JACKSON T1 (MISCELLANEOUS) ×2 IMPLANT
KIT ROOM TURNOVER OR (KITS) ×2 IMPLANT
KIT XLIF (KITS) ×1
NEEDLE 22X1 1/2 (OR ONLY) (NEEDLE) ×2 IMPLANT
NEEDLE I-PASS III (NEEDLE) ×2 IMPLANT
NEEDLE SPNL 18GX3.5 QUINCKE PK (NEEDLE) ×4 IMPLANT
NS IRRIG 1000ML POUR BTL (IV SOLUTION) ×2 IMPLANT
PACK LAMINECTOMY ORTHO (CUSTOM PROCEDURE TRAY) ×2 IMPLANT
PACK UNIVERSAL I (CUSTOM PROCEDURE TRAY) ×2 IMPLANT
PAD ARMBOARD 7.5X6 YLW CONV (MISCELLANEOUS) ×4 IMPLANT
PATTIES SURGICAL .5 X.5 (GAUZE/BANDAGES/DRESSINGS) IMPLANT
PATTIES SURGICAL .5 X1 (DISPOSABLE) ×2 IMPLANT
ROD 35MM (Rod) ×2 IMPLANT
SCREW PRECEPT 6.5X40 (Screw) ×4 IMPLANT
SCREW PRECEPT POLYAXIAL 7.5X40 (Screw) ×2 IMPLANT
SPONGE LAP 4X18 X RAY DECT (DISPOSABLE) ×4 IMPLANT
SPONGE SURGIFOAM ABS GEL 100 (HEMOSTASIS) ×2 IMPLANT
STAPLER VISISTAT 35W (STAPLE) ×2 IMPLANT
STRIP CLOSURE SKIN 1/2X4 (GAUZE/BANDAGES/DRESSINGS) IMPLANT
SURGIFLO TRUKIT (HEMOSTASIS) IMPLANT
SUT MON AB 3-0 SH 27 (SUTURE) ×2
SUT MON AB 3-0 SH27 (SUTURE) ×2 IMPLANT
SUT VIC AB 1 CT1 18XCR BRD 8 (SUTURE) ×1 IMPLANT
SUT VIC AB 1 CT1 27 (SUTURE) ×2
SUT VIC AB 1 CT1 27XBRD ANBCTR (SUTURE) ×2 IMPLANT
SUT VIC AB 1 CT1 8-18 (SUTURE) ×1
SUT VIC AB 1 CTX 18 (SUTURE) ×2 IMPLANT
SUT VIC AB 1 CTX 36 (SUTURE) ×2
SUT VIC AB 1 CTX36XBRD ANBCTR (SUTURE) ×2 IMPLANT
SUT VIC AB 2-0 CT1 18 (SUTURE) ×4 IMPLANT
SYR BULB IRRIGATION 50ML (SYRINGE) ×2 IMPLANT
SYR CONTROL 10ML LL (SYRINGE) ×2 IMPLANT
TAPE CLOTH 4X10 WHT NS (GAUZE/BANDAGES/DRESSINGS) ×4 IMPLANT
TOWEL OR 17X24 6PK STRL BLUE (TOWEL DISPOSABLE) ×2 IMPLANT
TOWEL OR 17X26 10 PK STRL BLUE (TOWEL DISPOSABLE) ×2 IMPLANT
TRAY FOLEY CATH 14FRSI W/METER (CATHETERS) ×2 IMPLANT
TRAY FOLEY CATH 16FRSI W/METER (SET/KITS/TRAYS/PACK) ×2 IMPLANT
WATER STERILE IRR 1000ML POUR (IV SOLUTION) ×2 IMPLANT
YANKAUER SUCT BULB TIP NO VENT (SUCTIONS) ×2 IMPLANT

## 2013-01-03 NOTE — Anesthesia Postprocedure Evaluation (Signed)
  Anesthesia Post-op Note  Patient: Brianna Mitchell  Procedure(s) Performed: Procedure(s): X LIF (Extreme Lateral Interbody Fusion) L3-L4,  (1 LEVEL)  (N/A) POSTERIOR SPINAL FUSION  L3-L4 (1 LEVEL) (N/A)  Patient Location: PACU  Anesthesia Type:General  Level of Consciousness: awake, alert  and oriented  Airway and Oxygen Therapy: Patient Spontanous Breathing and Patient connected to nasal cannula oxygen  Post-op Pain: mild  Post-op Assessment: Post-op Vital signs reviewed, Patient's Cardiovascular Status Stable, Respiratory Function Stable, Patent Airway and Pain level controlled  Post-op Vital Signs: stable  Complications: No apparent anesthesia complications

## 2013-01-03 NOTE — Brief Op Note (Signed)
01/03/2013  5:25 PM  PATIENT:  Brianna Mitchell  57 y.o. 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   POST-OPERATIVE DIAGNOSIS:  * No post-op diagnosis entered *  PROCEDURE:  Procedure(s): X LIF (Extreme Lateral Interbody Fusion) L3-L4,  (1 LEVEL)  (N/A) POSTERIOR SPINAL FUSION  L3-L4 (1 LEVEL) (N/A)  SURGEON:  Surgeon(s) and Role:    * Venita Lick, MD - Primary  PHYSICIAN ASSISTANT:   ASSISTANTS: Zonia Kief   ANESTHESIA:   general  EBL:  Total I/O In: 2000 [I.V.:2000] Out: 1100 [Urine:1050; Blood:50]  BLOOD ADMINISTERED:none  DRAINS: none   LOCAL MEDICATIONS USED:  MARCAINE     SPECIMEN:  No Specimen  DISPOSITION OF SPECIMEN:  N/A  COUNTS:  YES  TOURNIQUET:  * No tourniquets in log *  DICTATION: .Other Dictation: Dictation Number [id number]  PLAN OF CARE: Admit to inpatient   PATIENT DISPOSITION:  PACU - hemodynamically stable.

## 2013-01-03 NOTE — Preoperative (Signed)
Beta Blockers   Reason not to administer Beta Blockers:Not Applicable 

## 2013-01-03 NOTE — Transfer of Care (Signed)
Immediate Anesthesia Transfer of Care Note  Patient: Brianna Mitchell  Procedure(s) Performed: Procedure(s): X LIF (Extreme Lateral Interbody Fusion) L3-L4,  (1 LEVEL)  (N/A) POSTERIOR SPINAL FUSION  L3-L4 (1 LEVEL) (N/A)  Patient Location: PACU  Anesthesia Type:General  Level of Consciousness: awake, alert  and patient cooperative  Airway & Oxygen Therapy: Patient Spontanous Breathing and Patient connected to nasal cannula oxygen  Post-op Assessment: Report given to PACU RN, Post -op Vital signs reviewed and stable and Patient moving all extremities X 4  Post vital signs: Reviewed and stable  Complications: No apparent anesthesia complications

## 2013-01-03 NOTE — Anesthesia Preprocedure Evaluation (Signed)
Anesthesia Evaluation  Patient identified by MRN, date of birth, ID band Patient awake    Reviewed: Allergy & Precautions, H&P , NPO status , Patient's Chart, lab work & pertinent test results  Airway Mallampati: II  Neck ROM: full    Dental   Pulmonary shortness of breath, asthma ,          Cardiovascular hypertension,     Neuro/Psych  Headaches,  Neuromuscular disease    GI/Hepatic   Endo/Other  diabetes, Type 2Hypothyroidism obese  Renal/GU      Musculoskeletal   Abdominal   Peds  Hematology   Anesthesia Other Findings   Reproductive/Obstetrics                           Anesthesia Physical Anesthesia Plan  ASA: II  Anesthesia Plan: General   Post-op Pain Management:    Induction: Intravenous  Airway Management Planned: Oral ETT  Additional Equipment:   Intra-op Plan:   Post-operative Plan: Extubation in OR  Informed Consent: I have reviewed the patients History and Physical, chart, labs and discussed the procedure including the risks, benefits and alternatives for the proposed anesthesia with the patient or authorized representative who has indicated his/her understanding and acceptance.     Plan Discussed with: CRNA, Anesthesiologist and Surgeon  Anesthesia Plan Comments:         Anesthesia Quick Evaluation

## 2013-01-03 NOTE — H&P (Signed)
H+P reviewed No change in clinical exam

## 2013-01-03 NOTE — Anesthesia Procedure Notes (Signed)
Procedure Name: Intubation Date/Time: 01/03/2013 1:42 PM Performed by: Rogelia Boga Pre-anesthesia Checklist: Patient identified, Emergency Drugs available, Suction available, Patient being monitored and Timeout performed Patient Re-evaluated:Patient Re-evaluated prior to inductionOxygen Delivery Method: Circle system utilized Preoxygenation: Pre-oxygenation with 100% oxygen Intubation Type: IV induction Ventilation: Mask ventilation without difficulty Laryngoscope Size: Mac and 3 Grade View: Grade II Tube type: Oral Tube size: 7.5 mm Number of attempts: 3 Airway Equipment and Method: Stylet Placement Confirmation: ETT inserted through vocal cords under direct vision,  positive ETCO2 and breath sounds checked- equal and bilateral Secured at: 20 cm Tube secured with: Tape Dental Injury: Teeth and Oropharynx as per pre-operative assessment  Comments: DL x2 by Gregary Signs, Paramedic, unable to visualize cords.  DL x1 by CRNA, ETT easily placed, VSS

## 2013-01-04 ENCOUNTER — Inpatient Hospital Stay (HOSPITAL_COMMUNITY): Payer: PRIVATE HEALTH INSURANCE

## 2013-01-04 LAB — GLUCOSE, CAPILLARY
Glucose-Capillary: 105 mg/dL — ABNORMAL HIGH (ref 70–99)
Glucose-Capillary: 139 mg/dL — ABNORMAL HIGH (ref 70–99)
Glucose-Capillary: 155 mg/dL — ABNORMAL HIGH (ref 70–99)
Glucose-Capillary: 231 mg/dL — ABNORMAL HIGH (ref 70–99)
Glucose-Capillary: 48 mg/dL — ABNORMAL LOW (ref 70–99)
Glucose-Capillary: 63 mg/dL — ABNORMAL LOW (ref 70–99)
Glucose-Capillary: 65 mg/dL — ABNORMAL LOW (ref 70–99)
Glucose-Capillary: 79 mg/dL (ref 70–99)

## 2013-01-04 LAB — HEMOGLOBIN A1C
Hgb A1c MFr Bld: 11.5 % — ABNORMAL HIGH (ref <5.7)
Mean Plasma Glucose: 283 mg/dL — ABNORMAL HIGH (ref <117)

## 2013-01-04 MED ORDER — LIVING WELL WITH DIABETES BOOK
Freq: Once | Status: DC
  Filled 2013-01-04: qty 1

## 2013-01-04 MED ORDER — INFLUENZA VAC SPLIT QUAD 0.5 ML IM SUSP
0.5000 mL | INTRAMUSCULAR | Status: AC
  Administered 2013-01-05: 0.5 mL via INTRAMUSCULAR
  Filled 2013-01-04: qty 0.5

## 2013-01-04 MED ORDER — PNEUMOCOCCAL VAC POLYVALENT 25 MCG/0.5ML IJ INJ
0.5000 mL | INJECTION | INTRAMUSCULAR | Status: AC
  Administered 2013-01-05: 0.5 mL via INTRAMUSCULAR
  Filled 2013-01-04: qty 0.5

## 2013-01-04 NOTE — Evaluation (Signed)
Physical Therapy Evaluation Patient Details Name: Brianna Mitchell MRN: [id number] DOB: 11/26/1955 Today's Date: 01/04/2013 Time: 4098-1191 PT Time Calculation (min): 38 min  PT Assessment / Plan / Recommendation History of Present Illness  57 y.o. female s/p XLIF L3-L4.  PMH positive for DM, HTN, HA and neuromuscular disease.  Clinical Impression  Presents with decreased independence with mobility due to deficits listed below.  Will benefit from skilled PT in the acute setting to allow return home with assist and HHPT.    PT Assessment  Patient needs continued PT services    Follow Up Recommendations  Home health PT;Supervision/Assistance - 24 hour          Equipment Recommendations  Rolling walker with 5" wheels;Other (comment) (reports plans to get hospital bed)    Recommendations for Other Services   None  Frequency Min 5X/week    Precautions / Restrictions Precautions Precautions: Back Precaution Booklet Issued: Yes (comment) Required Braces or Orthoses: Spinal Brace Spinal Brace: Lumbar corset;Applied in sitting position   Pertinent Vitals/Pain 8/10 in back      Mobility  Bed Mobility Bed Mobility: Rolling Left;Sit to Sidelying Left;Sitting - Scoot to Edge of Bed;Left Sidelying to Sit Rolling Left: 4: Min assist;With rail Left Sidelying to Sit: 4: Min assist;HOB elevated;With rails Sitting - Scoot to Edge of Bed: 4: Min assist Transfers Transfers: Sit to Stand;Stand to Sit Sit to Stand: From bed;4: Min assist Stand to Sit: 4: Min assist;To chair/3-in-1;With upper extremity assist Details for Transfer Assistance: increased time for mobility Ambulation/Gait Ambulation/Gait Assistance: 4: Min assist;4: Min guard Ambulation Distance (Feet): 50 Feet Assistive device: Rolling walker Ambulation/Gait Assistance Details: stopped to rest multiple times due to c/o right leg giving out Gait Pattern: Step-to pattern;Decreased stride length        PT Diagnosis:  Difficulty walking;Acute pain  PT Problem List: Decreased strength;Decreased activity tolerance;Decreased balance;Decreased mobility;Pain;Decreased knowledge of precautions;Decreased knowledge of use of DME PT Treatment Interventions: DME instruction;Balance training;Gait training;Functional mobility training;Patient/family education;Therapeutic activities;Therapeutic exercise     PT Goals(Current goals can be found in the care plan section) Acute Rehab PT Goals Patient Stated Goal: To return home with help PT Goal Formulation: With patient Time For Goal Achievement: 01/11/13 Potential to Achieve Goals: Good  Visit Information  Last PT Received On: 01/04/13 Assistance Needed: +1 History of Present Illness: 58 y.o. female s/p XLIF L3-L4.  PMH positive for DM, HTN, HA and neuromuscular disease.       Prior Functioning  Home Living Family/patient expects to be discharged to:: Private residence Living Arrangements: Alone Available Help at Discharge: Friend(s);Personal care attendant;Available 24 hours/day Type of Home: Apartment Home Access: Level entry Home Layout: One level Additional Comments: States needs a walker and plans to get a hospital bed Prior Function Level of Independence: Needs assistance Gait / Transfers Assistance Needed: states could walk independent, but leg on right would give out ADL's / Homemaking Assistance Needed: states has help for bathing and dressing Communication Communication: No difficulties    Cognition  Cognition Arousal/Alertness: Awake/alert Behavior During Therapy: WFL for tasks assessed/performed Overall Cognitive Status: Within Functional Limits for tasks assessed    Extremity/Trunk Assessment Lower Extremity Assessment Lower Extremity Assessment: RLE deficits/detail;LLE deficits/detail RLE Deficits / Details: hip flexion 3-/5, knee extension 4-/5 painful and limited AROM LLE Deficits / Details: grossly [facility]   Balance Balance Balance  Assessed: Yes Static Standing Balance Static Standing - Balance Support: Bilateral upper extremity supported Static Standing - Level of Assistance: 4: Min assist  Static Standing - Comment/# of Minutes: steady assist needed during standing rest breaks due to c/o right leg giving out  End of Session PT - End of Session Equipment Utilized During Treatment: Back brace Activity Tolerance: Patient limited by fatigue Patient left: in chair;with call bell/phone within reach  GP     [facility] 01/04/2013, 10:46 AM Sheran Lawless, PT [phone] 01/04/2013

## 2013-01-04 NOTE — Op Note (Signed)
Brianna Mitchell, Brianna Mitchell NO.:  [id number]  MEDICAL RECORD NO.:  [id number]  LOCATION:  [id number]                        FACILITY:  [facility]  PHYSICIAN:  Alvy Beal, MD    DATE OF BIRTH:  23-Sep-1955  DATE OF PROCEDURE:  01/03/2013 DATE OF DISCHARGE:                              OPERATIVE REPORT   PREOPERATIVE DIAGNOSIS:  Adjacent segment degenerative spondylolisthesis, L3-4.  POSTOPERATIVE DIAGNOSIS:  Adjacent segment degenerative spondylolisthesis, L3-4.  OPERATIVE PROCEDURE:  Lateral L3-4 interbody fusion with NuVasive PEEK interbody cage, the size of the cage was the 12 lordotic x 18 x 50 packed with Osteocel with unilateral posterior pedicle screw fixation for segmental fusion L3-4, and posterolateral arthrodesis L3-4 with NuVasive pedicle screw system.  COMPLICATIONS:  None.  CONDITION:  Stable.  HISTORY:  This is a very pleasant woman who had an instrumented posterior spinal fusion and decompression several years ago.  She has done well until recently and she has had progressive debilitating pain. X-rays demonstrated a junctional spondylolisthesis with stenosis. Attempts at conservative management have failed to alleviate her symptoms, so she elected to proceed with surgery.  All appropriate risks, benefits, and alternatives were discussed with the patient and consent was obtained.  FIRST ASSISTANT:  Dorothey Baseman, MD.  OPERATIVE NOTE:  The patient was brought to the operating room and placed supine on the operating table.  After successful induction of general anesthesia and endotracheal intubation, TEDs, SCDs, and Foley were inserted.  All appropriate intraoperative needles for an EMG and SSEP monitoring were placed.  The patient was placed in a right lateral decubitus position (right side up).  She was taped and secured to the Skytron bed, so that we had an excellent visualization at both the AP and lateral planes at the L3-4 level.  I was able to  manually reduce the patient in this lateral position.  The flank and back were prepped and draped in the standard fashion.  Time- out was taken confirming patient, procedure, and all other pertinent important data.  Once this was completed, all bony prominences were well padded.  Axillary rolls were placed and she was secured and prepped and draped.  Time-out was taken confirming patient, procedure, and all other pertinent important data.  Once this was completed, I then used x-ray to identify the posterior and anterior borders of the L3-4 disk space.  I marked out this incision site and infiltrated with 0.25% Marcaine with epi.  I also infiltrated the two posterior incisions I had planned to make.  A lateral incision was made.  A sharp dissection was carried out down to the deep fascia and exposed the fascia of the external oblique.  I then made a counter incision 1 fingerbreadth posteriorly.  This was the width of my finger.  I advanced my finger down, bluntly dissecting to the posterior retroperitoneal sheath.  I then popped through the retroperitoneal fascia and then began dissecting bluntly with my finger mobilizing the retroperitoneal fat.  I palpated the iliopsoas.  I then brought my finger to the under surface of the oblique muscle.  I then bluntly dissected through the external and internal oblique until I could see my  finger coming through the lateral incision.  I then placed the trocar on top of it and brought it down to the surface of the psoas. I then stimulated the surface of the psoas to ensure that I was not on top of the lumbar plexus.  Once this was confirmed, I advanced my trocar through the psoas to the lateral aspect of the 3-4 disk space.  I secured it with the guide pin and then checked to make sure I was satisfactorily positioned in the AP and lateral planes.  Once I confirmed this, I then used sequential dilating and stimulating to confirm that the plexus was not  being traumatized.  I then placed the final retracting device over the last guide and secured it to the disk space with the trocar.  I then opened the blades and advanced them anteriorly, and so I had excellent visualization of the 3-4 disk space. I confirmed satisfactory position of my working trocar in the AP and lateral planes.  I incised the lateral aspect of the disk with a #10 blade scalpel, then used a combination of pituitary rongeurs, curettes, and Kerrison rongeurs to remove all of the disk material.  I then used a Cobb elevator to release the contralateral anulus.  Care was taken not to violate the anterior or posterior longitudinal ligaments.  Once this was complete, I then sequentially trialed spacing device which I elected to use the 12 lordotic as this provided the greatest and best fit.  I then rasped the endplates until I had bleeding subchondral bone.  I obtained the PEEK interbody cage, measured with trial, packed it with the Osteocel and malleted it to the appropriate depth.  I then made sure I had hemostasis with bipolar electrocautery, and then removed the trocar. I then closed the external oblique fascia with interrupted #1 Vicryl sutures, superficial with 2-0 Vicryl sutures and 3-0 Monocryl for the skin.  Steri-Strips and a dry dressing were applied.  I then closed this posterior incision as well.  Once both these wounds were cleaned and dried, I then restored the patient into the neutral position, and then identified the lateral aspect of the pedicle of L3.  I made an incision corresponding to this and advanced the percutaneous Jamshidi needle to the lateral aspect of the pedicle using intraoperative fluoro and running EMGs.  I advanced the Jamshidi through the pedicle to the posterior aspect of the vertebral body.  Once I confirmed satisfactory trajectory in both planes, I advanced it into the vertebral body itself.  I repeated this at L4.  I then placed the  guide pin, tapped over the guide pin, and then placed appropriate size screws at both levels.  Both screws were then stimulated and there was no electrodiagnostic evidence of breach.  I then measured and placed a 35 mm rod and torqued down the rod appropriately.  I then removed the inserting devices, irrigated the wound, placed the remaining bone graft in the posterolateral gutter, and then closed the deep fascia with interrupted #1 Vicryl sutures, 2-0 Vicryl sutures, and 3-0 Monocryl.  Steri-Strips and dry dressing were applied.  The patient was extubated, transferred to the PACU without incident.  At the end of the case, all needle and sponge counts were correct.  There was no adverse intraoperative events.     Alvy Beal, MD     DDB/MEDQ  D:  01/03/2013  T:  01/04/2013  Job:  [id number]

## 2013-01-04 NOTE — Clinical Social Work Note (Signed)
CSW received consult for possible SNF placement for pt upon medical discharge from [facility]. PT/OT recommendations are not for SNF placement. CSW signing off.  Please re consult if discharge disposition changes.   Darlyn Chamber, LCSWA Clinical Social Worker [phone]

## 2013-01-04 NOTE — Progress Notes (Signed)
Inpatient Diabetes Program Recommendations  AACE/ADA: New Consensus Statement on Inpatient Glycemic Control (2013)  Target Ranges:  Prepandial:   less than 140 mg/dL      Peak postprandial:   less than 180 mg/dL (1-2 hours)      Critically ill patients:  140 - 180 mg/dL   Pt now orderd diet.  Pt on moderate correction q 4 hrs.  Inpatient Diabetes Program Recommendations Correction (SSI): Once pt is taking po's well, will need to change corection to tidwc and the HS correction scale. Insulin - Meal Coverage: Pt may well need meala coverage as well, as her home regimen "Sliding Scale" includes correction and meal coverage HgbA1C is 11.5%  Please consider an order for OP education at [facility].  Will talk with patient regarding education and/or diabetes supply needs. Will order in-pt education per bedside RN using videos , teaching booklet and exit care notes.  Thank you, Lenor Coffin, RN, CNS, Diabetes Coordinator [phone])

## 2013-01-04 NOTE — Evaluation (Signed)
Occupational Therapy Evaluation Patient Details Name: Brianna Mitchell MRN: [id number] DOB: November 15, 1955 Today's Date: 01/04/2013 Time: 4098-1191 OT Time Calculation (min): 37 min  OT Assessment / Plan / Recommendation History of present illness 57 y.o. female s/p XLIF L3-L4.  PMH positive for DM, HTN, HA and neuromuscular disease.   Clinical Impression   Pt admitted for above.  She presents to OT with the below listed deficits and will benefit from continued OT to maximize safety and independence with BADLs. Pt. Has had 24 hour assist PTA, and will continue to have this level of care at discharge.  She will have max A with ADLs as she reports her shoulders limit her.  Focus of OT will be on education with caregiver and pt re: safety and precautions, and functional transfers.      OT Assessment  Patient needs continued OT Services    Follow Up Recommendations  No OT follow up;Supervision/Assistance - 24 hour    Barriers to Discharge      Equipment Recommendations  None recommended by OT    Recommendations for Other Services    Frequency  Min 2X/week    Precautions / Restrictions Precautions Precautions: Back Precaution Booklet Issued: Yes (comment) (PT previously provided) Required Braces or Orthoses: Spinal Brace Spinal Brace: Lumbar corset;Applied in sitting position   Pertinent Vitals/Pain     ADL  Eating/Feeding: Independent Where Assessed - Eating/Feeding: Bed level Grooming: Wash/dry hands;Wash/dry face;Min guard Where Assessed - Grooming: Supported standing Toilet Transfer: Hydrographic surveyor Method: Sit to stand;Stand Wellsite geologist: Raised toilet seat with arms (or 3-in-1 over toilet);Comfort height toilet;Grab bars Toileting - Clothing Manipulation and Hygiene: Minimal assistance Where Assessed - Glass blower/designer Manipulation and Hygiene: Standing Equipment Used: Rolling walker Transfers/Ambulation Related to ADLs: min guard assist ADL  Comments: Pt and caregiver report she has required assist with BADLs for a long while.  She has 24 hour assist, and will continue to have this level of assist at discharge.  Pt is not interested in AE as she states her shoulders and pain are what limit her and that won't change with AE use.  Bathing and dressing not assessed due to pt will have necessary level of assist at discharge (max).  Discussed back precautions and positioning with pt and caregiver. (Pt able to don/doff brace with min A and able to instruct )    OT Diagnosis: Generalized weakness;Acute pain  OT Problem List: Decreased strength;Decreased activity tolerance;Decreased knowledge of use of DME or AE;Decreased knowledge of precautions;Pain OT Treatment Interventions: Self-care/ADL training;DME and/or AE instruction;Therapeutic activities;Patient/family education   OT Goals(Current goals can be found in the care plan section) Acute Rehab OT Goals Patient Stated Goal: To return home with help OT Goal Formulation: With patient Time For Goal Achievement: 01/11/13 Potential to Achieve Goals: Good ADL Goals Pt Will Perform Grooming: with modified independence;standing Pt Will Transfer to Toilet: with modified independence;ambulating;regular height toilet;grab bars;bedside commode Pt Will Perform Toileting - Clothing Manipulation and hygiene: with modified independence;sit to/from stand Pt Will Perform Tub/Shower Transfer: Tub transfer;with min assist;ambulating;shower seat;rolling walker Additional ADL Goal #1: Pt will be independent with back precautions during all ADL activities and functional mobility   Visit Information  Last OT Received On: 01/04/13 Assistance Needed: +1 History of Present Illness: 57 y.o. female s/p XLIF L3-L4.  PMH positive for DM, HTN, HA and neuromuscular disease.       Prior Functioning     Home Living Family/patient expects to be discharged  to:: Private residence Living Arrangements:  Alone Available Help at Discharge: Friend(s);Personal care attendant;Available 24 hours/day Type of Home: Apartment Home Access: Level entry Home Layout: One level Home Equipment: Bedside commode;Shower seat Additional Comments: States needs a walker and plans to get a hospital bed Prior Function Level of Independence: Needs assistance Gait / Transfers Assistance Needed: states could walk independent, but leg on right would give out ADL's / Homemaking Assistance Needed: Pt reports she required mod - max A for UB ADLs and max A for LB ADLs.   Communication Communication: No difficulties Dominant Hand: Right         Vision/Perception     Cognition  Cognition Arousal/Alertness: Awake/alert Behavior During Therapy: WFL for tasks assessed/performed Overall Cognitive Status: Within Functional Limits for tasks assessed    Extremity/Trunk Assessment Upper Extremity Assessment Upper Extremity Assessment: RUE deficits/detail;LUE deficits/detail RUE Deficits / Details: Pt reports limitations due to long standing rotator cuff injury LUE Deficits / Details: Pt reports limitations due to long standing rotator cuff injury Lower Extremity Assessment Lower Extremity Assessment: Defer to PT evaluation Cervical / Trunk Assessment Cervical / Trunk Assessment: Normal     Mobility Bed Mobility Bed Mobility: Rolling Right;Right Sidelying to Sit;Sitting - Scoot to Edge of Bed;Sit to Sidelying Right Rolling Right: 4: Min guard;With rail Right Sidelying to Sit: 4: Min assist;With rails;HOB elevated Sitting - Scoot to Edge of Bed: 4: Min guard Sit to Sidelying Right: 4: Min assist Details for Bed Mobility Assistance: verbal cues for proper technique  Transfers Transfers: Sit to Stand;Stand to Sit Sit to Stand: 4: Min guard;With upper extremity assist;From bed;From chair/3-in-1;From toilet Stand to Sit: 4: Min guard;With upper extremity assist;To bed;To chair/3-in-1;To toilet Details for  Transfer Assistance: increased time.  Verbal cues for safety      Exercise     Balance     End of Session OT - End of Session Equipment Utilized During Treatment: Rolling walker;Back brace Activity Tolerance: Patient tolerated treatment well Patient left: in bed;with call bell/phone within reach;with nursing/sitter in room;with family/visitor present Nurse Communication: Mobility status  GO     Shatha Hooser M 01/04/2013, 7:40 PM

## 2013-01-04 NOTE — Progress Notes (Signed)
Subjective: Doing well.  C/o back pain.    Objective: Vital signs in last 24 hours: Temp:  [97 F (36.1 C)-98.1 F (36.7 C)] 97.9 F (36.6 C) (11/20 0624) Pulse Rate:  [70-96] 70 (11/20 0624) Resp:  [9-22] 10 (11/20 0624) BP: (85-134)/(50-75) 92/55 mmHg (11/20 0624) SpO2:  [98 %-100 %] 100 % (11/20 0624)  Intake/Output from previous day: 11/19 0701 - 11/20 0700 In: 2400 [I.V.:2400] Out: 1600 [Urine:1550; Blood:50] Intake/Output this shift:    No results found for this basename: HGB,  in the last 72 hours No results found for this basename: WBC, RBC, HCT, PLT,  in the last 72 hours No results found for this basename: NA, K, CL, CO2, BUN, CREATININE, GLUCOSE, CALCIUM,  in the last 72 hours No results found for this basename: LABPT, INR,  in the last 72 hours  Neurologically intact Neurovascular intact Dorsiflexion/Plantar flexion intact  Assessment/Plan: Start PT.  Brace must be on when up.  Anticipate possible d/c home Friday or Saturday is moving well.     Latecia Miler M 01/04/2013, 8:55 AM

## 2013-01-04 NOTE — Progress Notes (Signed)
UR COMPLETED  

## 2013-01-05 LAB — GLUCOSE, CAPILLARY
Glucose-Capillary: 127 mg/dL — ABNORMAL HIGH (ref 70–99)
Glucose-Capillary: 170 mg/dL — ABNORMAL HIGH (ref 70–99)
Glucose-Capillary: 217 mg/dL — ABNORMAL HIGH (ref 70–99)
Glucose-Capillary: 228 mg/dL — ABNORMAL HIGH (ref 70–99)
Glucose-Capillary: 244 mg/dL — ABNORMAL HIGH (ref 70–99)
Glucose-Capillary: 97 mg/dL (ref 70–99)

## 2013-01-05 MED ORDER — ACETAMINOPHEN 500 MG PO TABS
500.0000 mg | ORAL_TABLET | Freq: Once | ORAL | Status: AC
  Administered 2013-01-05: 500 mg via ORAL
  Filled 2013-01-05: qty 1

## 2013-01-05 MED ORDER — SODIUM CHLORIDE 0.9 % IV SOLN
INTRAVENOUS | Status: DC
  Administered 2013-01-06: 01:00:00 via INTRAVENOUS

## 2013-01-05 MED ORDER — ALBUTEROL SULFATE (5 MG/ML) 0.5% IN NEBU
2.5000 mg | INHALATION_SOLUTION | Freq: Every day | RESPIRATORY_TRACT | Status: DC
  Filled 2013-01-05: qty 0.5

## 2013-01-05 MED ORDER — METOCLOPRAMIDE HCL 5 MG/ML IJ SOLN
10.0000 mg | Freq: Four times a day (QID) | INTRAMUSCULAR | Status: DC
Start: 2013-01-06 — End: 2013-01-06
  Administered 2013-01-06 (×3): 10 mg via INTRAVENOUS
  Filled 2013-01-05 (×4): qty 2

## 2013-01-05 MED ORDER — OXYCODONE HCL ER 10 MG PO T12A
10.0000 mg | EXTENDED_RELEASE_TABLET | Freq: Two times a day (BID) | ORAL | Status: DC
  Administered 2013-01-05 – 2013-01-06 (×3): 10 mg via ORAL
  Filled 2013-01-05 (×3): qty 1

## 2013-01-05 MED ORDER — BISACODYL 10 MG RE SUPP
10.0000 mg | Freq: Every day | RECTAL | Status: DC | PRN
  Administered 2013-01-06: 10 mg via RECTAL
  Filled 2013-01-05: qty 1

## 2013-01-05 MED ORDER — FLEET ENEMA 7-19 GM/118ML RE ENEM
1.0000 | ENEMA | Freq: Once | RECTAL | Status: AC
  Administered 2013-01-05: 1 via RECTAL
  Filled 2013-01-05: qty 1

## 2013-01-05 MED ORDER — POLYETHYLENE GLYCOL 3350 17 G PO PACK: 17.0000 g | PACK | Freq: Every day | ORAL | Status: AC

## 2013-01-05 MED ORDER — MAGNESIUM CITRATE PO SOLN
1.0000 | Freq: Once | ORAL | Status: AC
  Administered 2013-01-05: 1 via ORAL
  Filled 2013-01-05: qty 296

## 2013-01-05 MED ORDER — ONDANSETRON 4 MG PO TBDP: 4.0000 mg | ORAL_TABLET | Freq: Three times a day (TID) | ORAL | Status: DC | PRN

## 2013-01-05 MED ORDER — DOCUSATE SODIUM 100 MG PO CAPS: 100.0000 mg | ORAL_CAPSULE | Freq: Two times a day (BID) | ORAL | Status: DC

## 2013-01-05 MED ORDER — ACETAMINOPHEN 325 MG PO TABS: 325.0000 mg | ORAL_TABLET | Freq: Four times a day (QID) | ORAL | Status: DC | PRN

## 2013-01-05 NOTE — Progress Notes (Signed)
Pt vomited another time. MD notified. Orders received. Will continue to monitor.

## 2013-01-05 NOTE — Progress Notes (Signed)
Talked to patient about DCP/ HHC choices, patient does not want any HHC at this time; patient lives at home with son and she stated that she will be all right; CM informed patient that if she changed her mind, HHC services can be arranged by her PCP after discharge; unable to order a rolling walker because she already have a walker at home; Abelino Derrick RN,BSN,MHA [phone]

## 2013-01-05 NOTE — Progress Notes (Signed)
Pt's temp has been increasing, it was 99.8 and now 100.6, but pt does not have any prn tylenol ordered, PA on call Shauna Hugh. notified, order received for a one time dose of tylenol po 500 mg, order will be carried out. Will continue to monitor pt.----Lynnae Ludemann, rn

## 2013-01-05 NOTE — Progress Notes (Signed)
Nutrition Consult/Brief Note  RD consulted for diet education regarding diabetes.    Lab Results  Component Value Date   HGBA1C 11.5* 01/04/2013   CBG (last 3)   Recent Labs  01/05/13 0005 01/05/13 0426 01/05/13 0827  GLUCAP 97 217* 127*    Wt Readings from Last 15 Encounters:  12/27/12 163 lb 12.8 oz (74.299 kg)  04/27/11 158 lb (71.668 kg)  04/27/11 158 lb (71.668 kg)    RD provided "Carbohydrate Counting For People With Diabetes" and "Diabetes Label Reading Tips" from Academy of [employer] Dietetics.  Patient declined education session; opted to review handouts on her own time.  RD available for follow-up should patient have questions and/or concerns.  BMI = 35.5 kg/m2.  Patient meets criteria for Obesity Class II based on current BMI.   Current diet order is Carbohydrate Modified Medium Calorie.  Labs and medications reviewed.   No further nutrition interventions warranted at this time.  Please re-consult RD as needed.   Maureen Chatters, RD, LDN Pager #: [phone] After-Hours Pager #: [phone]

## 2013-01-05 NOTE — Progress Notes (Signed)
Physical Therapy Treatment Patient Details Name: Brianna Mitchell MRN: [id number] DOB: August 17, 1955 Today's Date: 01/05/2013 Time: 4098-1191 PT Time Calculation (min): 17 min  PT Assessment / Plan / Recommendation  History of Present Illness 57 y.o. female s/p XLIF L3-L4.  PMH positive for DM, HTN, HA and neuromuscular disease.   PT Comments   Pt painful today and declining amb in hallway.  Will continue to follow.    Follow Up Recommendations  Home health PT;Supervision/Assistance - 24 hour     Does the patient have the potential to tolerate intense rehabilitation     Barriers to Discharge        Equipment Recommendations  Rolling walker with 5" wheels    Recommendations for Other Services    Frequency Min 5X/week   Progress towards PT Goals Progress towards PT goals: Progressing toward goals  Plan Current plan remains appropriate    Precautions / Restrictions Precautions Precautions: Back Precaution Comments: pt verbalized 3/3 back precautions.   Required Braces or Orthoses: Spinal Brace Spinal Brace: Lumbar corset;Applied in sitting position Restrictions Weight Bearing Restrictions: No   Pertinent Vitals/Pain 5/10.  Premedicated.      Mobility  Bed Mobility Bed Mobility: Rolling Right;Right Sidelying to Sit;Sitting - Scoot to Edge of Bed Rolling Right: 5: Supervision;With rail Right Sidelying to Sit: 4: Min assist;HOB elevated;With rails Sitting - Scoot to Edge of Bed: 5: Supervision Details for Bed Mobility Assistance: pt demos good technique.   Transfers Transfers: Sit to Stand;Stand to Sit Sit to Stand: 4: Min guard;With upper extremity assist;From bed;From chair/3-in-1 Stand to Sit: 4: Min guard;With upper extremity assist;To chair/3-in-1 Details for Transfer Assistance: increased time needed.  cues to control descent to chair.   Ambulation/Gait Ambulation/Gait Assistance: 4: Min guard Ambulation Distance (Feet): 12 Feet (x2) Assistive device: Rolling  walker Ambulation/Gait Assistance Details: pt fatigued and painful limiting mobility.  pt relies on RW.   Gait Pattern: Step-through pattern;Decreased stride length Stairs: No Wheelchair Mobility Wheelchair Mobility: No    Exercises     PT Diagnosis:    PT Problem List:   PT Treatment Interventions:     PT Goals (current goals can now be found in the care plan section) Acute Rehab PT Goals Patient Stated Goal: To return home with help Time For Goal Achievement: 01/11/13 Potential to Achieve Goals: Good  Visit Information  Last PT Received On: 01/05/13 Assistance Needed: +1 History of Present Illness: 57 y.o. female s/p XLIF L3-L4.  PMH positive for DM, HTN, HA and neuromuscular disease.    Subjective Data  Patient Stated Goal: To return home with help   Cognition  Cognition Arousal/Alertness: Awake/alert Behavior During Therapy: WFL for tasks assessed/performed Overall Cognitive Status: Within Functional Limits for tasks assessed    Balance  Balance Balance Assessed: No  End of Session PT - End of Session Equipment Utilized During Treatment: Back brace Activity Tolerance: Patient limited by fatigue;Patient limited by pain Patient left: in chair;with call bell/phone within reach Nurse Communication: Mobility status   GP     Sunny Schlein, [place] [phone] 01/05/2013, 3:12 PM

## 2013-01-05 NOTE — Progress Notes (Addendum)
Subjective: C/o some back pain.  Made good progress with therapy.    Objective: Vital signs in last 24 hours: Temp:  [97.6 F (36.4 C)-100.6 F (38.1 C)] 99.4 F (37.4 C) (11/21 0550) Pulse Rate:  [70-100] 90 (11/21 0550) Resp:  [11-20] 20 (11/21 0550) BP: (86-110)/(49-68) 95/56 mmHg (11/21 0550) SpO2:  [97 %-100 %] 100 % (11/21 0550)  Intake/Output from previous day:   Intake/Output this shift:    No results found for this basename: HGB,  in the last 72 hours No results found for this basename: WBC, RBC, HCT, PLT,  in the last 72 hours No results found for this basename: NA, K, CL, CO2, BUN, CREATININE, GLUCOSE, CALCIUM,  in the last 72 hours No results found for this basename: LABPT, INR,  in the last 72 hours  Exam:  Wound look good.  steris intact.  No drainage or signs of infection.  Calf nt, nvi.    Assessment/Plan: Anticipate d/c home today or Saturday.  Patent followed by pain management specialist.  She will resume percocet 10/325 and opana.  F/u with dr Shon Baton 2 weeks postop.    OWENS,JAMES M 01/05/2013, 8:03 AM     Agree with above Opana not on formulary.  Will substitute OxyContin for now Bowel prep for constipation Continue mobilization Xray findings noted.  Hardware is secure   Continue mobilization

## 2013-01-05 NOTE — Progress Notes (Signed)
Pt c/o constipation  Enema and mag citrate given with no result. Warm  prune juice also given  pt passing lots of gas but no BM yet . Medicated for pain   with some relief.pt decline PTand also  Refused to ambulate with nurising staff in the hall way.. Only ambulates to and fro from bed to bathroom and back to bed or chair  Po intake is poor. Pt needs lot of encouragement to get things done. PO ensure Glucerna 1 can given

## 2013-01-05 NOTE — Progress Notes (Signed)
NT informed RN that patient vomited. Large amount of yellow emesis found. Pt. Stated she had no results since Mag citrate and enema given. Pt. Also stated not passing much gas. Bowel sounds assessed. Has hypoactive bowel sounds with a distended abdomen. Pt. Stated nausea had subsided. Encouraged pt. To ambulate but stated "not right now." Will continue to encourage ambulation and monitor.

## 2013-01-05 NOTE — Progress Notes (Signed)
Inpatient Diabetes Program Recommendations  AACE/ADA: New Consensus Statement on Inpatient Glycemic Control (2013)  Target Ranges:  Prepandial:   less than 140 mg/dL      Peak postprandial:   less than 180 mg/dL (1-2 hours)      Critically ill patients:  140 - 180 mg/dL   Reason for Visit: Results for CEDRICA, BRUNE (MRN [id number]) as of 01/05/2013 13:47  Ref. Range 01/04/2013 22:20 01/05/2013 00:05 01/05/2013 04:26 01/05/2013 08:27 01/05/2013 12:29  Glucose-Capillary Latest Range: 70-99 mg/dL 65 (L) 97 657 (H) 846 (H) 170 (H)   Note patient had hypoglycemia yesterday.  Consider reducing NPH to 25 units bid while in the hospital.  Also please change Novolog correction to tid with meals and HS (instead of q 4 hours). Thanks, Beryl Meager, RN, BC-ADM Inpatient Diabetes Coordinator Pager [phone]

## 2013-01-06 ENCOUNTER — Inpatient Hospital Stay (HOSPITAL_COMMUNITY): Payer: PRIVATE HEALTH INSURANCE

## 2013-01-06 ENCOUNTER — Encounter (HOSPITAL_COMMUNITY): Payer: Self-pay

## 2013-01-06 LAB — BASIC METABOLIC PANEL
BUN: 13 mg/dL (ref 6–23)
CO2: 27 mEq/L (ref 19–32)
Calcium: 9.5 mg/dL (ref 8.4–10.5)
Chloride: 103 mEq/L (ref 96–112)
Creatinine, Ser: 0.95 mg/dL (ref 0.50–1.10)
GFR calc Af Amer: 75 mL/min — ABNORMAL LOW (ref >90)
GFR calc non Af Amer: 65 mL/min — ABNORMAL LOW (ref >90)
Glucose, Bld: 179 mg/dL — ABNORMAL HIGH (ref 70–99)
Potassium: 3.7 mEq/L (ref 3.5–5.1)
Sodium: 139 mEq/L (ref 135–145)

## 2013-01-06 LAB — GLUCOSE, CAPILLARY
Glucose-Capillary: 105 mg/dL — ABNORMAL HIGH (ref 70–99)
Glucose-Capillary: 109 mg/dL — ABNORMAL HIGH (ref 70–99)
Glucose-Capillary: 157 mg/dL — ABNORMAL HIGH (ref 70–99)
Glucose-Capillary: 172 mg/dL — ABNORMAL HIGH (ref 70–99)

## 2013-01-06 MED ORDER — DEXTROSE-NACL 5-0.9 % IV SOLN
INTRAVENOUS | Status: DC
Start: 2013-01-06 — End: 2013-01-06
  Administered 2013-01-06: 07:00:00 via INTRAVENOUS

## 2013-01-06 MED ORDER — DEXTROSE 5 % IV SOLN
INTRAVENOUS | Status: DC
  Administered 2013-01-06: 04:00:00 via INTRAVENOUS

## 2013-01-06 NOTE — Progress Notes (Signed)
Subjective: 3 Days Post-Op Procedure(s) (LRB): X LIF (Extreme Lateral Interbody Fusion) L3-L4,  (1 LEVEL)  (N/A) POSTERIOR SPINAL FUSION  L3-L4 (1 LEVEL) (N/A) Patient reports pain as 2 on 0-10 scale. Case discussed with Dr. Dairl Ponder Pa and she is ready fo DC today.   Objective: Vital signs in last 24 hours: Temp:  [98.4 F (36.9 C)-100 F (37.8 C)] 99.4 F (37.4 C) (11/22 0515) Pulse Rate:  [89-97] 93 (11/22 0515) Resp:  [18-20] 18 (11/22 0515) BP: (91-132)/(57-78) 91/57 mmHg (11/22 0515) SpO2:  [97 %-99 %] 98 % (11/22 0515)  Intake/Output from previous day: 11/21 0701 - 11/22 0700 In: 630 [P.O.:630] Out: 0  Intake/Output this shift:    No results found for this basename: HGB,  in the last 72 hours No results found for this basename: WBC, RBC, HCT, PLT,  in the last 72 hours  Recent Labs  01/05/13 0015  NA 139  K 3.7  CL 103  CO2 27  BUN 13  CREATININE 0.95  GLUCOSE 179*  CALCIUM 9.5   No results found for this basename: LABPT, INR,  in the last 72 hours  Neurologically intact  Assessment/Plan: 3 Days Post-Op Procedure(s) (LRB): X LIF (Extreme Lateral Interbody Fusion) L3-L4,  (1 LEVEL)  (N/A) POSTERIOR SPINAL FUSION  L3-L4 (1 LEVEL) (N/A) Discharge home with home health  Brianna Mitchell A 01/06/2013, 8:34 AM

## 2013-01-06 NOTE — Progress Notes (Signed)
   CARE MANAGEMENT NOTE 01/06/2013  Patient:  DODI, LEU A   Account Number:  [id number]  Date Initiated:  01/05/2013  Documentation initiated by:  Jiles Crocker  Subjective/Objective Assessment:   ADMITTED FOR BACK SURGERY     Action/Plan:   CM FOLLOWING FOR DCP   Anticipated DC Date:  01/05/2013   Anticipated DC Plan:  HOME/SELF CARE      DC Planning Services  CM consult      [facility] Choice  HOME HEALTH   Choice offered to / List presented to:  C-1 Patient        HH arranged  HH-2 PT  HH-3 OT      [facility] agency  [facility]   Status of service:  In process, will continue to follow Medicare Important Message given?  NA - LOS <3 / Initial given by admissions (If response is "NO", the following Medicare IM given date fields will be blank) Date Medicare IM given:   Date Additional Medicare IM given:    Discharge Disposition:  HOME W HOME HEALTH SERVICES  Per UR Regulation:  Reviewed for med. necessity/level of care/duration of stay  If discussed at Long Length of Stay Meetings, dates discussed:    Comments:  01/06/13 09:45 CM spoke with pt in room to offer choice and pt chooses [facility] for HHPT/OT.  Address and contact numbers verified.  Referral faxed to [facility] for HHPT/OT.  No other CM needs were communicated.  Freddy Jaksch, BSN, [place] [phone].  01/05/2013- Talked to patient about DCP/ HHC choices, patient does not want any HHC at this time; patient lives at home with son and she stated that she will be all right; CM informed patient that if she changed her mind, HHC services can be arranged by her PCP after discharge; unable to order a rolling walker because she already have a walker at home; B Millard RN,BSN,MHA [phone]

## 2013-01-06 NOTE — Progress Notes (Signed)
Pt. DC'd home via car with family.  DC instructions and prescriptions given to patient.  Assessments were stable.

## 2013-01-06 NOTE — Progress Notes (Signed)
Physical Therapy Treatment Patient Details Name: Brianna Mitchell MRN: [id number] DOB: 1955/03/03 Today's Date: 01/06/2013 Time: 4098-1191 PT Time Calculation (min): 12 min  PT Assessment / Plan / Recommendation  History of Present Illness 57 y.o. female s/p XLIF L3-L4.  PMH positive for DM, HTN, HA and neuromuscular disease.   PT Comments   Pt anticipates D/C today.  Pt much improved on ambulation today and seems more motivated.    Follow Up Recommendations  Home health PT;Supervision/Assistance - 24 hour     Does the patient have the potential to tolerate intense rehabilitation     Barriers to Discharge        Equipment Recommendations  Rolling walker with 5" wheels    Recommendations for Other Services    Frequency Min 5X/week   Progress towards PT Goals Progress towards PT goals: Progressing toward goals  Plan Current plan remains appropriate    Precautions / Restrictions Precautions Precautions: Back Precaution Comments: pt verbalized 2/3 back precautions.   Required Braces or Orthoses: Spinal Brace Spinal Brace: Lumbar corset;Applied in sitting position Restrictions Weight Bearing Restrictions: No   Pertinent Vitals/Pain Indicates premedicated and currently tolerable.      Mobility  Bed Mobility Bed Mobility: Not assessed Transfers Transfers: Sit to Stand;Stand to Sit Sit to Stand: 5: Supervision;With upper extremity assist;From bed Stand to Sit: 5: Supervision;With upper extremity assist;To chair/3-in-1 Details for Transfer Assistance: cues for UE use and controlling descent to chair.   Ambulation/Gait Ambulation/Gait Assistance: 4: Min guard Ambulation Distance (Feet): 100 Feet Assistive device: Rolling walker Ambulation/Gait Assistance Details: cues for upright posture and no twisting with turns.   Gait Pattern: Step-through pattern;Decreased stride length Stairs: No Wheelchair Mobility Wheelchair Mobility: No    Exercises     PT Diagnosis:    PT  Problem List:   PT Treatment Interventions:     PT Goals (current goals can now be found in the care plan section) Acute Rehab PT Goals Patient Stated Goal: To return home with help Time For Goal Achievement: 01/11/13 Potential to Achieve Goals: Good  Visit Information  Last PT Received On: 01/06/13 Assistance Needed: +1 History of Present Illness: 57 y.o. female s/p XLIF L3-L4.  PMH positive for DM, HTN, HA and neuromuscular disease.    Subjective Data  Patient Stated Goal: To return home with help   Cognition  Cognition Arousal/Alertness: Awake/alert Behavior During Therapy: WFL for tasks assessed/performed Overall Cognitive Status: Within Functional Limits for tasks assessed    Balance  Balance Balance Assessed: No  End of Session PT - End of Session Equipment Utilized During Treatment: Gait belt;Back brace Activity Tolerance: Patient tolerated treatment well Patient left: in chair;with call bell/phone within reach Nurse Communication: Mobility status   GP     Sunny Schlein,  [phone] 01/06/2013, 2:48 PM

## 2013-01-15 ENCOUNTER — Encounter (HOSPITAL_COMMUNITY): Payer: Self-pay | Admitting: Orthopedic Surgery

## 2013-01-22 NOTE — Discharge Summary (Signed)
Patient ID: AISSA LISOWSKI MRN: [id number] DOB/AGE: 1955-08-21 57 y.o.  Admit date: 01/03/2013 Discharge date: 01/22/2013  Admission Diagnoses:  Active Problems:   * No active hospital problems. *   Discharge Diagnoses:  Active Problems:   * No active hospital problems. *  status post Procedure(s): X LIF (Extreme Lateral Interbody Fusion) L3-L4,  (1 LEVEL)  POSTERIOR SPINAL FUSION  L3-L4 (1 LEVEL)  Past Medical History  Diagnosis Date  . Diabetes mellitus   . Asthma   . Thyroid disease   . Ganglion cyst   . Fibroid tumor   . Carpal tunnel syndrome   . Back pain   . Shoulder pain   . Tennis elbow   . Hypothyroidism   . Headache(784.0)   . Shortness of breath   . Hypertension     dr Jacinto Halim       Surgeries: Procedure(s): X LIF (Extreme Lateral Interbody Fusion) L3-L4,  (1 LEVEL)  POSTERIOR SPINAL FUSION  L3-L4 (1 LEVEL) on 01/03/2013   Consultants:    Discharged Condition: Improved  Hospital Course: SHALIMAR MCCLAIN is an 57 y.o. female who was admitted 01/03/2013 for operative treatment of <principal problem not specified>. Patient failed conservative treatments (please see the history and physical for the specifics) and had severe unremitting pain that affects sleep, daily activities and work/hobbies. After pre-op clearance, the patient was taken to the operating room on 01/03/2013 and underwent  Procedure(s): X LIF (Extreme Lateral Interbody Fusion) L3-L4,  (1 LEVEL)  POSTERIOR SPINAL FUSION  L3-L4 (1 LEVEL).    Patient was given perioperative antibiotics:  Anti-infectives   Start     Dose/Rate Route Frequency Ordered Stop   01/03/13 1945  ceFAZolin (ANCEF) IVPB 1 g/50 mL premix     1 g 100 mL/hr over 30 Minutes Intravenous Every 8 hours 01/03/13 1944 01/04/13 0438   01/02/13 1454  ceFAZolin (ANCEF) IVPB 2 g/50 mL premix     2 g 100 mL/hr over 30 Minutes Intravenous 30 min pre-op 01/02/13 1454 01/03/13 1350       Patient was given sequential compression  devices and early ambulation to prevent DVT.   Patient benefited maximally from hospital stay and there were no complications. At the time of discharge, the patient was urinating/moving their bowels without difficulty, tolerating a regular diet, pain is controlled with oral pain medications and they have been cleared by PT/OT.   Recent vital signs: No data found.    Recent laboratory studies: No results found for this basename: WBC, HGB, HCT, PLT, NA, K, CL, CO2, BUN, CREATININE, GLUCOSE, PT, INR, CALCIUM, 2,  in the last 72 hours   Discharge Medications:     Medication List    STOP taking these medications       ALEVE 220 MG tablet  Generic drug:  naproxen sodium      TAKE these medications       albuterol 108 (90 BASE) MCG/ACT inhaler  Commonly known as:  PROVENTIL HFA;VENTOLIN HFA  Inhale 2 puffs into the lungs every 6 (six) hours as needed. For shortness of breath     albuterol (2.5 MG/3ML) 0.083% nebulizer solution  Commonly known as:  PROVENTIL  Take 2.5 mg by nebulization every 6 (six) hours as needed. For shortness of breath     beclomethasone 80 MCG/ACT inhaler  Commonly known as:  QVAR  Inhale 1 puff into the lungs daily with lunch.     cyclobenzaprine 10 MG tablet  Commonly known as:  FLEXERIL  Take 10 mg by mouth 3 (three) times daily as needed. For muscle spasms     DEXILANT 60 MG capsule  Generic drug:  dexlansoprazole  Take 60 mg by mouth daily.     docusate sodium 100 MG capsule  Commonly known as:  COLACE  Take 1 capsule (100 mg total) by mouth 2 (two) times daily.     insulin lispro 100 UNIT/ML injection  Commonly known as:  HUMALOG  Inject 0-35 Units into the skin 2 (two) times daily. On sliding scale:  125: nothing, 200: 17 units, 400: 35 units     insulin NPH 100 UNIT/ML injection  Commonly known as:  HUMULIN N,NOVOLIN N  Inject 50 Units into the skin 2 (two) times daily.     levothyroxine 75 MCG tablet  Commonly known as:  SYNTHROID,  LEVOTHROID  Take 75 mcg by mouth daily.     olmesartan 20 MG tablet  Commonly known as:  BENICAR  Take 20 mg by mouth daily.     ondansetron 4 MG disintegrating tablet  Commonly known as:  ZOFRAN-ODT  Take 1 tablet (4 mg total) by mouth every 8 (eight) hours as needed for nausea or vomiting.     oxyCODONE-acetaminophen 10-325 MG per tablet  Commonly known as:  PERCOCET  Take 1 tablet by mouth every 8 (eight) hours as needed for pain. For pain     oxymorphone 40 MG 12 hr tablet  Commonly known as:  OPANA ER  Take 80 mg by mouth every 12 (twelve) hours.     polyethylene glycol packet  Commonly known as:  MIRALAX / GLYCOLAX  Take 17 g by mouth daily.     rosuvastatin 40 MG tablet  Commonly known as:  CRESTOR  Take 40 mg by mouth daily.     topiramate 25 MG capsule  Commonly known as:  TOPAMAX  Take 50 mg by mouth daily.        Diagnostic Studies: Dg Lumbar Spine 2-3 Views  01/04/2013   CLINICAL DATA:  Postop pain  EXAM: LUMBAR SPINE - 2-3 VIEW  COMPARISON:  MR lumbar spine 04/30/2010  FINDINGS: There are 5 nonrib bearing lumbar-type vertebral bodies. The vertebral body heights are maintained. The alignment is anatomic. There is no spondylolysis. There is no static listhesis. There is a unilateral right posterior spinal fusion at L3-4 without hardware failure. There is a vertically-oriented fracture of the anterior aspect of the L3 vertebral body traversing the superior and inferior endplates, immediately anterior to the intervertebral spacer device. There is an intervertebral spacer device towards the left side of the disc space in unchanged position compared with intra op plain film done 01/03/2013. There is degenerative disc disease at the adjacent L4-5 disc level.  The SI joints are unremarkable.  IMPRESSION: There is a vertically-oriented fracture of the anterior aspect of the L3 vertebral body traversing the superior and inferior endplates, immediately anterior to the  intervertebral spacer device.   Electronically Signed   By: Elige Ko   On: 01/04/2013 08:13   Dg Lumbar Spine 2-3 Views  01/03/2013   CLINICAL DATA:  Back pain.  EXAM: LUMBAR SPINE - 2-3 VIEW; DG C-ARM GT 120 MIN  COMPARISON:  MRI 04/30/2010  FINDINGS: L3-L4 extreme lateral interbody fusion has been performed. Unilateral pedicle screw construct on the right. No gross adverse features.  IMPRESSION: As above.   Electronically Signed   By: Davonna Belling M.D.   On: 01/03/2013 17:59   Dg  Abd Portable 1v  01/06/2013   CLINICAL DATA:  Constipation, nausea and emesis  EXAM: PORTABLE ABDOMEN - 1 VIEW  COMPARISON:  04/28/2009  FINDINGS: There is moderate gaseous distension of the large bowel loops. In the early postoperative. These findings may reflect postoperative ileus. Lumbar spine hardware is identified compatible with recent L3-4 lateral interbody fusion.  IMPRESSION: 1. Gaseous distension of bowel loops which is suspicious for ileus.   Electronically Signed   By: Signa Kell M.D.   On: 01/06/2013 07:53   Dg C-arm Gt 120 Min  01/03/2013   CLINICAL DATA:  Back pain.  EXAM: LUMBAR SPINE - 2-3 VIEW; DG C-ARM GT 120 MIN  COMPARISON:  MRI 04/30/2010  FINDINGS: L3-L4 extreme lateral interbody fusion has been performed. Unilateral pedicle screw construct on the right. No gross adverse features.  IMPRESSION: As above.   Electronically Signed   By: Davonna Belling M.D.   On: 01/03/2013 17:59        Discharge Orders   Future Orders Complete By Expires   Call MD / Call 911  As directed    Comments:     If you experience chest pain or shortness of breath, CALL 911 and be transported to the hospital emergency room.  If you develope a fever above 101 F, pus (white drainage) or increased drainage or redness at the wound, or calf pain, call your surgeon's office.   Constipation Prevention  As directed    Comments:     Drink plenty of fluids.  Prune juice may be helpful.  You may use a stool softener, such as  Colace (over the counter) 100 mg twice a day.  Use MiraLax (over the counter) for constipation as needed.   Diet - low sodium heart healthy  As directed    Discharge instructions  As directed    Comments:     Ok to shower 5 days postop.  Do not apply any creams or ointments to incision.  Do not remove steri-strips.  Can use 4x4 gauze and tape for dressing changes.  No aggressive activity.  No bending, squatting or prolonged sitting.  Mostly be in reclined position or lying down.  Must wear brace.   Driving restrictions  As directed    Comments:     No driving until further notice.   Increase activity slowly as tolerated  As directed    Lifting restrictions  As directed    Comments:     No lifting until further notice.      Follow-up Information   Call Alvy Beal, MD. (need return office visit 2 weeks postop.  sooner if needed. )    Specialty:  Orthopedic Surgery   Contact information:   [address] [place] [address] [phone]       Follow up with [facility]. (for home health physical therapy and occupational therapy)    Contact information:   [address] [place] [address] [phone]       Discharge Plan:  discharge to home  Disposition: stable    Signed: Venita Lick D for Dr. Venita Lick [facility] [phone] 01/22/2013, 12:13 PM

## 2013-05-29 ENCOUNTER — Other Ambulatory Visit: Payer: Self-pay | Admitting: Gastroenterology

## 2013-05-29 DIAGNOSIS — R112 Nausea with vomiting, unspecified: Secondary | ICD-10-CM

## 2013-06-08 ENCOUNTER — Encounter (HOSPITAL_COMMUNITY)
Admission: RE | Admit: 2013-06-08 | Discharge: 2013-06-08 | Disposition: A | Discharge status: Home / Self Care | Payer: PRIVATE HEALTH INSURANCE | Source: Ambulatory Visit | Attending: Gastroenterology | Admitting: Gastroenterology

## 2013-06-08 DIAGNOSIS — R112 Nausea with vomiting, unspecified: Secondary | ICD-10-CM

## 2013-06-08 MED ORDER — TECHNETIUM TC 99M SULFUR COLLOID
2.0000 | Freq: Once | INTRAVENOUS | Status: AC | PRN
  Administered 2013-06-08: 2 via ORAL

## 2013-09-10 ENCOUNTER — Emergency Department (INDEPENDENT_AMBULATORY_CARE_PROVIDER_SITE_OTHER)
Admission: EM | Admit: 2013-09-10 | Discharge: 2013-09-10 | Disposition: A | Discharge status: Home / Self Care | Payer: PRIVATE HEALTH INSURANCE | Source: Home / Self Care | Attending: Emergency Medicine | Admitting: Emergency Medicine

## 2013-09-10 ENCOUNTER — Encounter (HOSPITAL_COMMUNITY): Payer: Self-pay | Admitting: Emergency Medicine

## 2013-09-10 DIAGNOSIS — M545 Low back pain, unspecified: Secondary | ICD-10-CM

## 2013-09-10 DIAGNOSIS — W010XXA Fall on same level from slipping, tripping and stumbling without subsequent striking against object, initial encounter: Secondary | ICD-10-CM

## 2013-09-10 DIAGNOSIS — M549 Dorsalgia, unspecified: Secondary | ICD-10-CM

## 2013-09-10 DIAGNOSIS — Y92009 Unspecified place in unspecified non-institutional (private) residence as the place of occurrence of the external cause: Secondary | ICD-10-CM

## 2013-09-10 DIAGNOSIS — M25519 Pain in unspecified shoulder: Secondary | ICD-10-CM

## 2013-09-10 DIAGNOSIS — M25559 Pain in unspecified hip: Secondary | ICD-10-CM

## 2013-09-10 DIAGNOSIS — W19XXXA Unspecified fall, initial encounter: Secondary | ICD-10-CM

## 2013-09-10 NOTE — ED Provider Notes (Signed)
Medical screening examination/treatment/procedure(s) were performed by resident physician or non-physician practitioner and as supervising physician I was immediately available for consultation/collaboration.  Maryruth Eve, MD     Melony Overly, MD 09/10/13 [phone]

## 2013-09-10 NOTE — ED Notes (Signed)
States 7-20 she fell up 3 steps when her leg gave out on her. C/o swelling in her right thigh and lump on her back . Has bee up, walking about as per usual, but c/o a lot of pain. Using oxycodone and opana for pain

## 2013-09-10 NOTE — ED Provider Notes (Signed)
CSN: [id number]     Arrival date & time 09/10/13  8416 History   First MD Initiated Contact with Patient 09/10/13 0913     Chief Complaint  Patient presents with  . Fall   (Consider location/radiation/quality/duration/timing/severity/associated sxs/prior Treatment) HPI Comments: Patient states she stumbled and fell up 3 steps to her apartment 1 week ago. Reports landing on her right side and hip area. Presents with complaints of pain at both forearms, both shoulders, her mid and lower back, her right hip, and right thigh. No head trauma or LOC as a result of fall. No neck pain or facial injury.  Reports she did contact her orthopedist (Dr. Rolena Infante) last week for evaluation, but skipped her appointment.  She presents here stating that she is is pain and has run out of her medications used for chronic pain (opana and percocet) and requests that we refill these medications for her.   Patient is a 58 y.o. female presenting with fall. The history is provided by the patient.  Fall This is a new problem.    Past Medical History  Diagnosis Date  . Diabetes mellitus   . Asthma   . Thyroid disease   . Ganglion cyst   . Fibroid tumor   . Carpal tunnel syndrome   . Back pain   . Shoulder pain   . Tennis elbow   . Hypothyroidism   . Headache(784.0)   . Shortness of breath   . Hypertension     dr Einar Gip      Past Surgical History  Procedure Laterality Date  . Tubal ligation    . Abdominal hysterectomy    . Pelvic bone removed    . Rotator cuff repair    . Cholecystectomy    . Back surgery      x5  . Thyroid surgery      LUMP REMOVED  . Anterior lat lumbar fusion N/A 01/03/2013    Procedure: X LIF (Extreme Lateral Interbody Fusion) L3-L4,  (1 LEVEL) ;  Surgeon: Melina Schools, MD;  Location: [facility];  Service: Orthopedics;  Laterality: N/A;   History reviewed. No pertinent family history. History  Substance Use Topics  . Smoking status: Never Smoker   . Smokeless tobacco: Not on file   . Alcohol Use: No   OB History   Grav Para Term Preterm Abortions TAB SAB Ect Mult Living                 Review of Systems  All other systems reviewed and are negative.   Allergies  Aspirin; Sulfa antibiotics; and Demerol  Home Medications   Prior to Admission medications   Medication Sig Start Date End Date Taking? Authorizing Provider  albuterol (PROVENTIL HFA;VENTOLIN HFA) 108 (90 BASE) MCG/ACT inhaler Inhale 2 puffs into the lungs every 6 (six) hours as needed. For shortness of breath    Historical Provider, MD  albuterol (PROVENTIL) (2.5 MG/3ML) 0.083% nebulizer solution Take 2.5 mg by nebulization every 6 (six) hours as needed. For shortness of breath    Historical Provider, MD  beclomethasone (QVAR) 80 MCG/ACT inhaler Inhale 1 puff into the lungs daily with lunch.    Historical Provider, MD  cyclobenzaprine (FLEXERIL) 10 MG tablet Take 10 mg by mouth 3 (three) times daily as needed. For muscle spasms    Historical Provider, MD  dexlansoprazole (DEXILANT) 60 MG capsule Take 60 mg by mouth daily.    Historical Provider, MD  docusate sodium (COLACE) 100 MG capsule Take 1  capsule (100 mg total) by mouth 2 (two) times daily. 01/05/13   Benjiman Core, PA-C  insulin lispro (HUMALOG) 100 UNIT/ML injection Inject 0-35 Units into the skin 2 (two) times daily. On sliding scale:  125: nothing, 200: 17 units, 400: 35 units    Historical Provider, MD  insulin NPH (HUMULIN N,NOVOLIN N) 100 UNIT/ML injection Inject 50 Units into the skin 2 (two) times daily.     Historical Provider, MD  levothyroxine (SYNTHROID, LEVOTHROID) 75 MCG tablet Take 75 mcg by mouth daily.    Historical Provider, MD  olmesartan (BENICAR) 20 MG tablet Take 20 mg by mouth daily.    Historical Provider, MD  ondansetron (ZOFRAN-ODT) 4 MG disintegrating tablet Take 1 tablet (4 mg total) by mouth every 8 (eight) hours as needed for nausea or vomiting. 01/05/13   Benjiman Core, PA-C  oxyCODONE-acetaminophen (PERCOCET) 10-325 MG  per tablet Take 1 tablet by mouth every 8 (eight) hours as needed for pain. For pain    Historical Provider, MD  oxymorphone (OPANA ER) 40 MG 12 hr tablet Take 80 mg by mouth every 12 (twelve) hours.     Historical Provider, MD  polyethylene glycol (MIRALAX / GLYCOLAX) packet Take 17 g by mouth daily. 01/05/13   Benjiman Core, PA-C  rosuvastatin (CRESTOR) 40 MG tablet Take 40 mg by mouth daily.    Historical Provider, MD  topiramate (TOPAMAX) 25 MG capsule Take 50 mg by mouth daily.     Historical Provider, MD   BP 97/64  Pulse 75  Temp(Src) 98.1 F (36.7 C) (Oral)  Resp 18  Ht 4\' 9"  (1.448 m)  Wt 149 lb (67.586 kg)  BMI 32.23 kg/m2  SpO2 97% Physical Exam  Nursing note and vitals reviewed. Constitutional: She is oriented to person, place, and time. She appears well-developed and well-nourished. No distress.  +ambulatory without assistance  HENT:  Head: Normocephalic and atraumatic.  Right Ear: Hearing and external ear normal.  Left Ear: Hearing and external ear normal.  Nose: Nose normal.  Eyes: Conjunctivae are normal. No scleral icterus.  Neck: Normal range of motion. Neck supple.  Cardiovascular: Normal rate, regular rhythm and normal heart sounds.   Pulmonary/Chest: Effort normal and breath sounds normal. No respiratory distress. She has no wheezes.  Abdominal: Soft. Bowel sounds are normal. She exhibits no distension. There is no tenderness.  Musculoskeletal:       Right shoulder: She exhibits tenderness. She exhibits normal range of motion, no bony tenderness, no swelling, no effusion, no crepitus, no deformity, no laceration, normal pulse and normal strength.       Left shoulder: She exhibits tenderness. She exhibits normal range of motion, no bony tenderness, no swelling, no effusion, no crepitus, no deformity, no laceration, normal pulse and normal strength.       Right hip: She exhibits tenderness. She exhibits normal range of motion, normal strength, no bony tenderness, no  swelling, no crepitus, no deformity and no laceration.       Left hip: She exhibits tenderness. She exhibits normal range of motion, normal strength, no bony tenderness, no swelling, no crepitus, no deformity and no laceration.       Right knee: Normal.       Left knee: Normal.       Thoracic back: She exhibits tenderness. She exhibits normal range of motion, no bony tenderness, no swelling, no edema, no deformity and no laceration.       Lumbar back: She exhibits tenderness. She exhibits normal range  of motion, no bony tenderness, no swelling, no edema, no deformity and no laceration.       Right forearm: Normal.       Left forearm: Normal.       Right upper leg: Normal.       Left upper leg: Normal.  Neurological: She is alert and oriented to person, place, and time.  Skin: Skin is warm and dry. No rash noted. No erythema.  +intact  Psychiatric: She has a normal mood and affect. Her behavior is normal.    ED Course  Procedures (including critical care time) Labs Review Labs Reviewed - No data to display  Imaging Review No results found.   MDM   1. Fall at home, initial encounter    I advised patient that we did not have radiology technician at our clinic today and that her exam did not suggest the need for urgent radiographs, however, I did advise her that if she had particular areas of concern, that I would be glad to send her by shuttle to [facility] for radiographs. She declined this service stating she would prefer to follow up with her orthopedist instead. Lastly, I explained to her that she would need to speak with her pain management provider for additional Rxs for Opana and Percocet and recommended the use of extra strength tylenol as directed on packaging for her pain until she was able to discuss pain management with her usual provider.    [place], [place] 09/10/13 [phone]

## 2013-09-10 NOTE — Discharge Instructions (Signed)

## 2013-10-18 ENCOUNTER — Other Ambulatory Visit: Payer: Self-pay | Admitting: Gastroenterology

## 2013-10-18 DIAGNOSIS — R1033 Periumbilical pain: Secondary | ICD-10-CM

## 2013-10-24 ENCOUNTER — Ambulatory Visit
Admission: RE | Admit: 2013-10-24 | Discharge: 2013-10-24 | Disposition: A | Discharge status: Home / Self Care | Payer: PRIVATE HEALTH INSURANCE | Source: Ambulatory Visit | Attending: Gastroenterology | Admitting: Gastroenterology

## 2013-10-24 DIAGNOSIS — R1033 Periumbilical pain: Secondary | ICD-10-CM

## 2013-10-24 MED ORDER — IOHEXOL 300 MG/ML  SOLN
100.0000 mL | Freq: Once | INTRAMUSCULAR | Status: AC | PRN
  Administered 2013-10-24: 100 mL via INTRAVENOUS

## 2014-01-24 ENCOUNTER — Encounter (HOSPITAL_COMMUNITY): Payer: Self-pay | Admitting: Cardiology

## 2014-02-19 DIAGNOSIS — J45998 Other asthma: Secondary | ICD-10-CM | POA: Diagnosis not present

## 2014-02-19 DIAGNOSIS — M25561 Pain in right knee: Secondary | ICD-10-CM | POA: Diagnosis not present

## 2014-02-26 ENCOUNTER — Ambulatory Visit: Payer: Medicaid Other | Admitting: Diagnostic Neuroimaging

## 2014-02-27 DIAGNOSIS — G890 Central pain syndrome: Secondary | ICD-10-CM | POA: Diagnosis not present

## 2014-02-27 DIAGNOSIS — M545 Low back pain: Secondary | ICD-10-CM | POA: Diagnosis not present

## 2014-02-27 DIAGNOSIS — R202 Paresthesia of skin: Secondary | ICD-10-CM | POA: Diagnosis not present

## 2014-02-27 DIAGNOSIS — M961 Postlaminectomy syndrome, not elsewhere classified: Secondary | ICD-10-CM | POA: Diagnosis not present

## 2014-03-06 ENCOUNTER — Ambulatory Visit: Payer: Medicaid Other | Admitting: Diagnostic Neuroimaging

## 2014-03-12 ENCOUNTER — Encounter: Payer: Self-pay | Admitting: Diagnostic Neuroimaging

## 2014-03-12 ENCOUNTER — Ambulatory Visit (INDEPENDENT_AMBULATORY_CARE_PROVIDER_SITE_OTHER): Payer: 59 | Admitting: Diagnostic Neuroimaging

## 2014-03-12 ENCOUNTER — Telehealth: Payer: Self-pay

## 2014-03-12 VITALS — BP 128/80 | HR 78 | Temp 97.8°F | Ht <= 58 in | Wt 162.8 lb

## 2014-03-12 DIAGNOSIS — G43019 Migraine without aura, intractable, without status migrainosus: Secondary | ICD-10-CM | POA: Diagnosis not present

## 2014-03-12 DIAGNOSIS — I10 Essential (primary) hypertension: Secondary | ICD-10-CM

## 2014-03-12 DIAGNOSIS — R5382 Chronic fatigue, unspecified: Secondary | ICD-10-CM

## 2014-03-12 DIAGNOSIS — E669 Obesity, unspecified: Secondary | ICD-10-CM

## 2014-03-12 DIAGNOSIS — R0683 Snoring: Secondary | ICD-10-CM

## 2014-03-12 DIAGNOSIS — G4700 Insomnia, unspecified: Secondary | ICD-10-CM

## 2014-03-12 MED ORDER — TOPIRAMATE 100 MG PO TABS: 100.0000 mg | ORAL_TABLET | Freq: Two times a day (BID) | ORAL | Status: DC

## 2014-03-12 MED ORDER — RIZATRIPTAN BENZOATE 10 MG PO TBDP: 10.0000 mg | ORAL_TABLET | ORAL | Status: DC | PRN

## 2014-03-12 MED ORDER — TOPIRAMATE 50 MG PO TABS: 100.0000 mg | ORAL_TABLET | Freq: Two times a day (BID) | ORAL | Status: DC

## 2014-03-12 NOTE — Telephone Encounter (Signed)
Rx's have been resent to [facility] per patient request.

## 2014-03-12 NOTE — Patient Instructions (Addendum)
Increase topiramate to 100mg  twice a day.  Use rizatriptan as needed for migraine. Max 2 per day and 8 per month.  I will check sleep study.

## 2014-03-12 NOTE — Progress Notes (Signed)
[facility]  PATIENT: Brianna Mitchell DOB: Nov 20, 1955  REFERRING CLINICIAN: Odem HISTORY FROM: patient  REASON FOR VISIT: new consult    HISTORICAL  CHIEF COMPLAINT:  Chief Complaint  Patient presents with  . New Evaluation    HISTORY OF PRESENT ILLNESS:   59 year old ambidextrous female here for evaluation of headaches.  Since age 10 years old patient has had intermittent global, severe throbbing headaches, with blurred vision, irritability, nausea and photophobia and phonophobia. Around 2008 patient was evaluated by [facility], dx'd with migraine, and prescribed topiramate 25 mg twice a day. Patient then followed up with PCP who increased this to 50 kg twice a day for past 2-3 yrs.   Patient continues to have multiple low-grade headaches per day. She has 3 severe migraine headaches per week. She typically tries to calm herself, relax, sometimes takes half tablet of generic Excedrin Migraine tablet. She reports allergy to aspirin and this medication has aspirin in it. She reports some itching sensation when she takes it.  Patient reports remote sleep study testing more than 10 years ago but does not know the results. She is not on CPAP therapy. She has significant insomnia which she feels is related to her chronic back pain. She has snoring, obesity, HTN  and daytime sleepiness.  No specific other triggering factors for headache.  REVIEW OF SYSTEMS: Full 14 system review of systems performed and notable only for weight loss blurred vision double vision eye pain shortness of breath cough wheezing snoring feeling hot feeling cold increased thirst joint pain joint swelling cramps aching muscles allergies constipation rash itching not in the sleep change in appetite dizziness sleepiness snoring restless legs headache numbness weakness.  ALLERGIES: Allergies  Allergen Reactions  . Aspirin Swelling  . Sulfa Antibiotics Swelling  . Demerol Rash     HOME MEDICATIONS: Outpatient Prescriptions Prior to Visit  Medication Sig Dispense Refill  . albuterol (PROVENTIL HFA;VENTOLIN HFA) 108 (90 BASE) MCG/ACT inhaler Inhale 2 puffs into the lungs every 6 (six) hours as needed. For shortness of breath    . albuterol (PROVENTIL) (2.5 MG/3ML) 0.083% nebulizer solution Take 2.5 mg by nebulization every 6 (six) hours as needed. For shortness of breath    . beclomethasone (QVAR) 80 MCG/ACT inhaler Inhale 1 puff into the lungs daily with lunch.    . dexlansoprazole (DEXILANT) 60 MG capsule Take 60 mg by mouth daily.    Marland Kitchen levothyroxine (SYNTHROID, LEVOTHROID) 75 MCG tablet Take 75 mcg by mouth daily.    . ondansetron (ZOFRAN-ODT) 4 MG disintegrating tablet Take 1 tablet (4 mg total) by mouth every 8 (eight) hours as needed for nausea or vomiting. 50 tablet 0  . polyethylene glycol (MIRALAX / GLYCOLAX) packet Take 17 g by mouth daily. 24 each 0  . rosuvastatin (CRESTOR) 40 MG tablet Take 40 mg by mouth daily.    . cyclobenzaprine (FLEXERIL) 10 MG tablet Take 10 mg by mouth 3 (three) times daily as needed. For muscle spasms    . docusate sodium (COLACE) 100 MG capsule Take 1 capsule (100 mg total) by mouth 2 (two) times daily. 60 capsule 0  . insulin lispro (HUMALOG) 100 UNIT/ML injection Inject 0-35 Units into the skin 2 (two) times daily. On sliding scale:  125: nothing, 200: 17 units, 400: 35 units    . insulin NPH (HUMULIN N,NOVOLIN N) 100 UNIT/ML injection Inject 50 Units into the skin 2 (two) times daily.     Marland Kitchen olmesartan (BENICAR) 20  MG tablet Take 20 mg by mouth daily.    Marland Kitchen oxyCODONE-acetaminophen (PERCOCET) 10-325 MG per tablet Take 1 tablet by mouth every 8 (eight) hours as needed for pain. For pain    . oxymorphone (OPANA ER) 40 MG 12 hr tablet Take 80 mg by mouth every 12 (twelve) hours.     . topiramate (TOPAMAX) 25 MG capsule Take 50 mg by mouth daily.      No facility-administered medications prior to visit.    PAST MEDICAL  HISTORY: Past Medical History  Diagnosis Date  . Diabetes mellitus   . Asthma   . Thyroid disease   . Ganglion cyst   . Fibroid tumor   . Carpal tunnel syndrome   . Back pain   . Shoulder pain   . Tennis elbow   . Hypothyroidism   . Headache(784.0)   . Shortness of breath   . Hypertension     dr Einar Gip       PAST SURGICAL HISTORY: Past Surgical History  Procedure Laterality Date  . Tubal ligation    . Abdominal hysterectomy    . Pelvic bone removed    . Rotator cuff repair    . Cholecystectomy    . Back surgery      x5  . Thyroid surgery      LUMP REMOVED  . Anterior lat lumbar fusion N/A 01/03/2013    Procedure: X LIF (Extreme Lateral Interbody Fusion) L3-L4,  (1 LEVEL) ;  Surgeon: Melina Schools, MD;  Location: [facility];  Service: Orthopedics;  Laterality: N/A;  . Left heart catheterization with coronary angiogram N/A 04/27/2011    Procedure: LEFT HEART CATHETERIZATION WITH CORONARY ANGIOGRAM;  Surgeon: Laverda Page, MD;  Location: [facility] CATH LAB;  Service: Cardiovascular;  Laterality: N/A;    FAMILY HISTORY: Family History  Problem Relation Age of Onset  . Heart disease Mother   . Heart disease Father   . Breast cancer Sister   . Heart disease Sister   . Heart disease Brother     SOCIAL HISTORY:  History   Social History  . Marital Status: Divorced    Spouse Name: N/A    Number of Children: N/A  . Years of Education: N/A   Occupational History  . Not on file.   Social History Main Topics  . Smoking status: Never Smoker   . Smokeless tobacco: Not on file  . Alcohol Use: No  . Drug Use: No  . Sexual Activity: Not on file   Other Topics Concern  . Not on file   Social History Narrative     PHYSICAL EXAM  Filed Vitals:   03/12/14 0916  BP: 128/80  Pulse: 78  Temp: 97.8 F (36.6 C)  TempSrc: Oral  Height: 4\' 9"  (1.448 m)  Weight: 162 lb 12.8 oz (73.846 kg)    Body mass index is 35.22 kg/(m^2).   Visual Acuity Screening   Right eye  Left eye Both eyes  Without correction: 20/70 20/70   With correction:       No flowsheet data found.  GENERAL EXAM: Patient is in no distress; well developed, nourished and groomed; neck is supple  CARDIOVASCULAR: Regular rate and rhythm, no murmurs, no carotid bruits  NEUROLOGIC: MENTAL STATUS: awake, alert, oriented to person, place and time, recent and remote memory intact, normal attention and concentration, language fluent, comprehension intact, naming intact, fund of knowledge appropriate CRANIAL NERVE: no papilledema on fundoscopic exam, pupils equal and reactive to light, visual  fields full to confrontation, extraocular muscles intact, no nystagmus, facial sensation and strength symmetric, hearing intact, palate elevates symmetrically, uvula midline, shoulder shrug symmetric, tongue midline. MOTOR: normal bulk and tone, full strength in the BUE, BLE SENSORY: normal and symmetric to light touch, pinprick, temperature, vibration  COORDINATION: finger-nose-finger, fine finger movements normal REFLEXES: deep tendon reflexes present and symmetric GAIT/STATION: narrow based gait; able to walk tandem; romberg is negative    DIAGNOSTIC DATA (LABS, IMAGING, TESTING) - I reviewed patient records, labs, notes, testing and imaging myself where available.  Lab Results  Component Value Date   WBC 6.9 12/27/2012   HGB 10.4* 12/27/2012   HCT 32.8* 12/27/2012   MCV 87.9 12/27/2012   PLT 255 12/27/2012      Component Value Date/Time   NA 139 01/05/2013 0015   K 3.7 01/05/2013 0015   CL 103 01/05/2013 0015   CO2 27 01/05/2013 0015   GLUCOSE 179* 01/05/2013 0015   BUN 13 01/05/2013 0015   CREATININE 0.95 01/05/2013 0015   CALCIUM 9.5 01/05/2013 0015   PROT 7.1 12/02/2009 1756   ALBUMIN 3.8 12/02/2009 1756   AST 31 12/02/2009 1756   ALT 38* 12/02/2009 1756   ALKPHOS 82 12/02/2009 1756   BILITOT 0.6 12/02/2009 1756   GFRNONAA 65* 01/05/2013 0015   GFRAA 75* 01/05/2013 0015    No results found for: CHOL, HDL, LDLCALC, LDLDIRECT, TRIG, CHOLHDL Lab Results  Component Value Date   HGBA1C 11.5* 01/04/2013   No results found for: VITAMINB12 Lab Results  Component Value Date   TSH 1.577 12/02/2009   I reviewed images myself and agree with interpretation. -VRP  03/12/14 CT head - Negative for bleed or other acute intracranial process.    ASSESSMENT AND PLAN  59 y.o. year old female here with migraine without aura since age 41 years old.   PLAN: - increase TPX to 100mg  BID - try rizatriptan as needed for breakthrough migraine (patient denies h/o stroke or MI) - stop generic Excedrin migraine (due patient's aspirin allergy); use tylenol prn instead - will check sleep study (obesity, hypertension, snoring, daytime fatigue, insomnia)  Meds ordered this encounter  Medications  . topiramate (TOPAMAX) 50 MG tablet    Sig: Take 2 tablets (100 mg total) by mouth 2 (two) times daily.    Dispense:  60 tablet    Refill:  12  . rizatriptan (MAXALT-MLT) 10 MG disintegrating tablet    Sig: Take 1 tablet (10 mg total) by mouth as needed for migraine. May repeat in 2 hours if needed    Dispense:  9 tablet    Refill:  11   Return in about 3 months (around 06/11/2014).    Penni Bombard, MD 3/82/5053, 97:67 AM Certified in Neurology, Neurophysiology and Neuroimaging  [facility] [address] [place], [place] [address] [phone]

## 2014-03-12 NOTE — Telephone Encounter (Signed)
Please make sure Patients RX goes to this Pharm [id number] - [place], [place] - [place]

## 2014-03-13 DIAGNOSIS — M79605 Pain in left leg: Secondary | ICD-10-CM | POA: Diagnosis not present

## 2014-03-13 DIAGNOSIS — M79604 Pain in right leg: Secondary | ICD-10-CM | POA: Diagnosis not present

## 2014-03-13 DIAGNOSIS — G8929 Other chronic pain: Secondary | ICD-10-CM | POA: Diagnosis not present

## 2014-03-13 DIAGNOSIS — M545 Low back pain: Secondary | ICD-10-CM | POA: Diagnosis not present

## 2014-03-15 DIAGNOSIS — J45998 Other asthma: Secondary | ICD-10-CM | POA: Diagnosis not present

## 2014-03-19 ENCOUNTER — Telehealth: Payer: Self-pay | Admitting: Neurology

## 2014-03-19 DIAGNOSIS — M25561 Pain in right knee: Secondary | ICD-10-CM | POA: Diagnosis not present

## 2014-03-19 DIAGNOSIS — R519 Headache, unspecified: Secondary | ICD-10-CM

## 2014-03-19 DIAGNOSIS — R0683 Snoring: Secondary | ICD-10-CM

## 2014-03-19 DIAGNOSIS — R51 Headache: Secondary | ICD-10-CM

## 2014-03-19 DIAGNOSIS — G2581 Restless legs syndrome: Secondary | ICD-10-CM

## 2014-03-19 DIAGNOSIS — G4719 Other hypersomnia: Secondary | ICD-10-CM

## 2014-03-19 DIAGNOSIS — E669 Obesity, unspecified: Secondary | ICD-10-CM

## 2014-03-19 NOTE — Telephone Encounter (Signed)
Dr. Leta Baptist, refers patient for attended sleep study.  Height: 4'9"  Weight: 162 lb 12.8 oz   BMI: 35.22  Past Medical History:  Diabetes mellitus    . Asthma   . Thyroid disease   . Ganglion cyst   . Fibroid tumor   . Carpal tunnel syndrome   . Back pain   . Shoulder pain   . Tennis elbow   . Hypothyroidism   . Headache(784.0)   . Shortness of breath   . Hypertension     dr Einar Gip      Sleep Symptoms: Patient reports remote sleep study testing more than 10 years ago but does not know the results. She is not on CPAP therapy. She has significant insomnia which she feels is related to her chronic back pain. She has snoring, obesity, HTN and daytime sleepiness, restless legs.   Epworth Score: Unable to reach the patient   Medication:  Albuterol Sulfate (Aero Soln) PROVENTIL HFA;VENTOLIN HFA 108 (90 BASE) MCG/ACT Inhale 2 puffs into the lungs every 6 (six) hours as needed. For shortness of breath       Albuterol Sulfate (Nebu Soln) PROVENTIL (2.5 MG/3ML) 0.083% Take 2.5 mg by nebulization every 6 (six) hours as needed. For shortness of breath      Beclomethasone Dipropionate (Aero Soln) QVAR 80 MCG/ACT Inhale 1 puff into the lungs daily with lunch.      Dexlansoprazole (Capsule Delayed Release) DEXILANT 60 MG Take 60 mg by mouth daily.      Glucose Blood (Strip) ONE TOUCH ULTRA TEST        Insulin Lispro (Human) (Solution Pen-injector) HUMALOG KWIKPEN 100 UNIT/ML       Levothyroxine Sodium (Tab) SYNTHROID, LEVOTHROID 75 MCG Take 75 mcg by mouth daily.      Naloxone HCl (Solution Auto-injector) EVZIO 0.4 MG/0.4ML       Ondansetron (Tablet Dispersible) ZOFRAN-ODT 4 MG Take 1 tablet (4 mg total) by mouth every 8 (eight) hours as needed for nausea or vomiting.      OxyCODONE HCl (Tab) Oxycodone HCl 20 MG       Oxymorphone HCl (Tablet Extended Release 12 hour Abuse-Deterrent) OPANA ER (CRUSH RESISTANT) 40 MG  Take 1 tablet by mouth every 8 (eight) hours.      Polyethylene Glycol 3350 (Pack) MIRALAX / GLYCOLAX  Take 17 g by mouth daily.      Rizatriptan Benzoate (Tablet Dispersible) MAXALT-MLT 10 MG Take 1 tablet (10 mg total) by mouth as needed for migraine. May repeat in 2 hours if needed      Rosuvastatin Calcium (Tab) CRESTOR 40 MG Take 40 mg by mouth daily.      Topiramate (Tab) TOPAMAX 100 MG Take 1 tablet (100 mg total) by mouth 2 (two) times daily.       Ins: [employer]/Medicaid   Assessment & Plan: 59 y.o. year old female here with migraine without aura since age 59 years old.   PLAN: - increase TPX to 100mg  BID - try rizatriptan as needed for breakthrough migraine (patient denies h/o stroke or MI) - stop generic Excedrin migraine (due patient's aspirin allergy); use tylenol prn instead - will check sleep study (obesity, hypertension, snoring, daytime fatigue, insomnia)  Meds ordered this encounter  Medications  . topiramate (TOPAMAX) 50 MG tablet    Sig: Take 2 tablets (100 mg total) by mouth 2 (two) times daily.    Dispense: 60 tablet    Refill: 12  . rizatriptan (MAXALT-MLT) 10 MG disintegrating tablet  Sig: Take 1 tablet (10 mg total) by mouth as needed for migraine. May repeat in 2 hours if needed    Dispense: 9 tablet    Refill: 11   Return in about 3 months (around 06/11/2014).    Please review patient information and submit instructions for scheduling and orders for sleep technologist. Thank you.

## 2014-03-20 NOTE — Telephone Encounter (Signed)
In House Sleep study request review: This patient has an underlying medical history of asthma, thyroid disease, chronic back pain, hypothyroidism, migraine headaches, hypertension, and obesity, and is referred by Dr. Leta Baptist for an attended sleep study due to a report of snoring, recurrent headaches, excessive daytime somnolence and difficulty maintaining sleep as well as restless leg symptoms. I will order a split-night sleep study and see the patient in sleep medicine consultation afterwards as appropriate. Please print this note and attach to sleep study chart/package.   Sleep Acquisition Technologist instructions: Please score at 4% and split if 2 hour estimated AHI >20/h, unless mandated otherwise by the insurance carrier.    Star Age, MD, PhD [facility])

## 2014-03-29 DIAGNOSIS — G894 Chronic pain syndrome: Secondary | ICD-10-CM | POA: Diagnosis not present

## 2014-03-29 DIAGNOSIS — N182 Chronic kidney disease, stage 2 (mild): Secondary | ICD-10-CM | POA: Diagnosis not present

## 2014-03-29 DIAGNOSIS — M25561 Pain in right knee: Secondary | ICD-10-CM | POA: Diagnosis not present

## 2014-03-29 DIAGNOSIS — Z79899 Other long term (current) drug therapy: Secondary | ICD-10-CM | POA: Diagnosis not present

## 2014-03-29 DIAGNOSIS — N183 Chronic kidney disease, stage 3 (moderate): Secondary | ICD-10-CM | POA: Diagnosis not present

## 2014-03-29 DIAGNOSIS — E1165 Type 2 diabetes mellitus with hyperglycemia: Secondary | ICD-10-CM | POA: Diagnosis not present

## 2014-03-29 DIAGNOSIS — I129 Hypertensive chronic kidney disease with stage 1 through stage 4 chronic kidney disease, or unspecified chronic kidney disease: Secondary | ICD-10-CM | POA: Diagnosis not present

## 2014-04-09 DIAGNOSIS — J45998 Other asthma: Secondary | ICD-10-CM | POA: Diagnosis not present

## 2014-04-11 ENCOUNTER — Telehealth: Payer: Self-pay | Admitting: Diagnostic Neuroimaging

## 2014-04-11 NOTE — Telephone Encounter (Signed)
This patient was scheduled for a sleep study but has decided to cancel this appointment until after she has undergone her surgery and therapy to follow.  She will call the office and reschedule at a later date.

## 2014-04-16 HISTORY — PX: KNEE ARTHROSCOPY: SUR90

## 2014-04-24 DIAGNOSIS — M23331 Other meniscus derangements, other medial meniscus, right knee: Secondary | ICD-10-CM | POA: Diagnosis not present

## 2014-04-24 DIAGNOSIS — S82014A Nondisplaced osteochondral fracture of right patella, initial encounter for closed fracture: Secondary | ICD-10-CM | POA: Diagnosis not present

## 2014-04-24 DIAGNOSIS — M958 Other specified acquired deformities of musculoskeletal system: Secondary | ICD-10-CM | POA: Diagnosis not present

## 2014-04-24 DIAGNOSIS — M23361 Other meniscus derangements, other lateral meniscus, right knee: Secondary | ICD-10-CM | POA: Diagnosis not present

## 2014-04-24 DIAGNOSIS — M94261 Chondromalacia, right knee: Secondary | ICD-10-CM | POA: Diagnosis not present

## 2014-04-24 DIAGNOSIS — M23341 Other meniscus derangements, anterior horn of lateral meniscus, right knee: Secondary | ICD-10-CM | POA: Diagnosis not present

## 2014-04-24 DIAGNOSIS — M23321 Other meniscus derangements, posterior horn of medial meniscus, right knee: Secondary | ICD-10-CM | POA: Diagnosis not present

## 2014-05-09 DIAGNOSIS — J45998 Other asthma: Secondary | ICD-10-CM | POA: Diagnosis not present

## 2014-05-14 ENCOUNTER — Telehealth: Payer: Self-pay | Admitting: *Deleted

## 2014-05-14 NOTE — Telephone Encounter (Signed)
Says Dr. Dietrich Pates referred her to our clinic, pt is asking for a status update on her referral

## 2014-05-22 NOTE — Telephone Encounter (Signed)
Referral was rcvd on 05-03-14 and is in the review process. This process usually takes approximately 4 weeks.

## 2014-05-23 NOTE — Telephone Encounter (Signed)
Called Miss Riling and let her know that the referral to be seen in our clinic is in process and she should be hearing about the decision soon

## 2014-06-04 DIAGNOSIS — J45998 Other asthma: Secondary | ICD-10-CM | POA: Diagnosis not present

## 2014-06-11 ENCOUNTER — Ambulatory Visit: Payer: 59 | Admitting: Diagnostic Neuroimaging

## 2014-06-20 DIAGNOSIS — E1165 Type 2 diabetes mellitus with hyperglycemia: Secondary | ICD-10-CM | POA: Diagnosis not present

## 2014-06-20 DIAGNOSIS — I10 Essential (primary) hypertension: Secondary | ICD-10-CM | POA: Diagnosis not present

## 2014-06-20 DIAGNOSIS — E780 Pure hypercholesterolemia: Secondary | ICD-10-CM | POA: Diagnosis not present

## 2014-06-20 DIAGNOSIS — E039 Hypothyroidism, unspecified: Secondary | ICD-10-CM | POA: Diagnosis not present

## 2014-07-23 DIAGNOSIS — J45998 Other asthma: Secondary | ICD-10-CM | POA: Diagnosis not present

## 2014-07-25 ENCOUNTER — Ambulatory Visit: Payer: Medicaid Other | Admitting: *Deleted

## 2014-07-31 DIAGNOSIS — G8929 Other chronic pain: Secondary | ICD-10-CM | POA: Diagnosis not present

## 2014-08-07 DIAGNOSIS — S83241D Other tear of medial meniscus, current injury, right knee, subsequent encounter: Secondary | ICD-10-CM | POA: Diagnosis not present

## 2014-08-07 DIAGNOSIS — Z4789 Encounter for other orthopedic aftercare: Secondary | ICD-10-CM | POA: Diagnosis not present

## 2014-08-12 DIAGNOSIS — I129 Hypertensive chronic kidney disease with stage 1 through stage 4 chronic kidney disease, or unspecified chronic kidney disease: Secondary | ICD-10-CM | POA: Diagnosis not present

## 2014-08-12 DIAGNOSIS — Z794 Long term (current) use of insulin: Secondary | ICD-10-CM | POA: Diagnosis not present

## 2014-08-12 DIAGNOSIS — R300 Dysuria: Secondary | ICD-10-CM | POA: Diagnosis not present

## 2014-08-12 DIAGNOSIS — E1165 Type 2 diabetes mellitus with hyperglycemia: Secondary | ICD-10-CM | POA: Diagnosis not present

## 2014-08-21 DIAGNOSIS — J45998 Other asthma: Secondary | ICD-10-CM | POA: Diagnosis not present

## 2014-08-27 ENCOUNTER — Encounter: Payer: Medicaid Other | Attending: Endocrinology | Admitting: *Deleted

## 2014-08-27 DIAGNOSIS — E1165 Type 2 diabetes mellitus with hyperglycemia: Secondary | ICD-10-CM

## 2014-08-27 DIAGNOSIS — IMO0002 Reserved for concepts with insufficient information to code with codable children: Secondary | ICD-10-CM

## 2014-08-28 NOTE — Progress Notes (Signed)
Professional Dexcom Set Up on 08/27/14 Time arrived:1400  Time left: 1430 Patient here for placement of Dexcom Continuous Glucose Monitor   Explained to patient purpose of wearing the Dexcom per MD orders. They expressed verbal understanding.  Sensor inserted into skin at least 2 inches from any insulin injection sites. Patient instructed to check BG 2 times each day to calibrate. Explained to patient how to input onto [facility] all BG, food intake, exercise and any diabetes medications.  Patient instructed to return on Monday, 09/02/14 for removal of sensor and Dexcom  Dexcom transmitter attached to sensor and taped down.  Patient informed that when Dexcom and sensor are connected, the system is waterproof and that they are to wear it consistently until they return to this office.  Patient to call this office if any questions or concerns prior to appointment to return the [facility] for downloading.

## 2014-09-16 DIAGNOSIS — J45998 Other asthma: Secondary | ICD-10-CM | POA: Diagnosis not present

## 2014-09-19 DIAGNOSIS — I158 Other secondary hypertension: Secondary | ICD-10-CM | POA: Diagnosis not present

## 2014-09-19 DIAGNOSIS — J4520 Mild intermittent asthma, uncomplicated: Secondary | ICD-10-CM | POA: Diagnosis not present

## 2014-09-19 DIAGNOSIS — Z79891 Long term (current) use of opiate analgesic: Secondary | ICD-10-CM | POA: Diagnosis not present

## 2014-09-19 DIAGNOSIS — M4326 Fusion of spine, lumbar region: Secondary | ICD-10-CM | POA: Diagnosis not present

## 2014-10-18 DIAGNOSIS — I158 Other secondary hypertension: Secondary | ICD-10-CM | POA: Diagnosis not present

## 2014-10-18 DIAGNOSIS — M4326 Fusion of spine, lumbar region: Secondary | ICD-10-CM | POA: Diagnosis not present

## 2014-10-18 DIAGNOSIS — J4520 Mild intermittent asthma, uncomplicated: Secondary | ICD-10-CM | POA: Diagnosis not present

## 2014-11-14 ENCOUNTER — Telehealth: Payer: Self-pay | Admitting: Diagnostic Neuroimaging

## 2014-11-14 DIAGNOSIS — M4326 Fusion of spine, lumbar region: Secondary | ICD-10-CM | POA: Diagnosis not present

## 2014-11-14 DIAGNOSIS — I158 Other secondary hypertension: Secondary | ICD-10-CM | POA: Diagnosis not present

## 2014-11-14 DIAGNOSIS — J4520 Mild intermittent asthma, uncomplicated: Secondary | ICD-10-CM | POA: Diagnosis not present

## 2014-11-14 NOTE — Telephone Encounter (Signed)
Clement with [facility] called stating [employer] will not pay for rizatriptan (MAXALT-MLT) 10 MG disintegrating tablet  But will pay for plain tablet. Please call with new RX- [phone]

## 2014-11-14 NOTE — Telephone Encounter (Signed)
Per Dr Leta Baptist, spoke with [facility] and informed him patient may have plain tablet of Rizatriptan. He verbalized understanding.  Called patient to inform of med approval and scheduled her for FU. She had cancelled in April never rescheduled. She is scheduled for 11/20/14.  She verbalized agreement with FU and understanding.

## 2014-11-14 NOTE — Telephone Encounter (Signed)
Yes ok to change. -VRP

## 2014-11-15 DIAGNOSIS — J45998 Other asthma: Secondary | ICD-10-CM | POA: Diagnosis not present

## 2014-11-20 ENCOUNTER — Ambulatory Visit: Payer: Self-pay | Admitting: Diagnostic Neuroimaging

## 2014-11-24 IMAGING — CR DG CHEST 2V
2 series · 2 of 2 positions shown · non-contrast
Comparison: Chest x-ray 11/21/2011

CLINICAL DATA: Shortness of breath, cough, left-sided chest pain

EXAM:
CHEST  2 VIEW

[w chest pa]
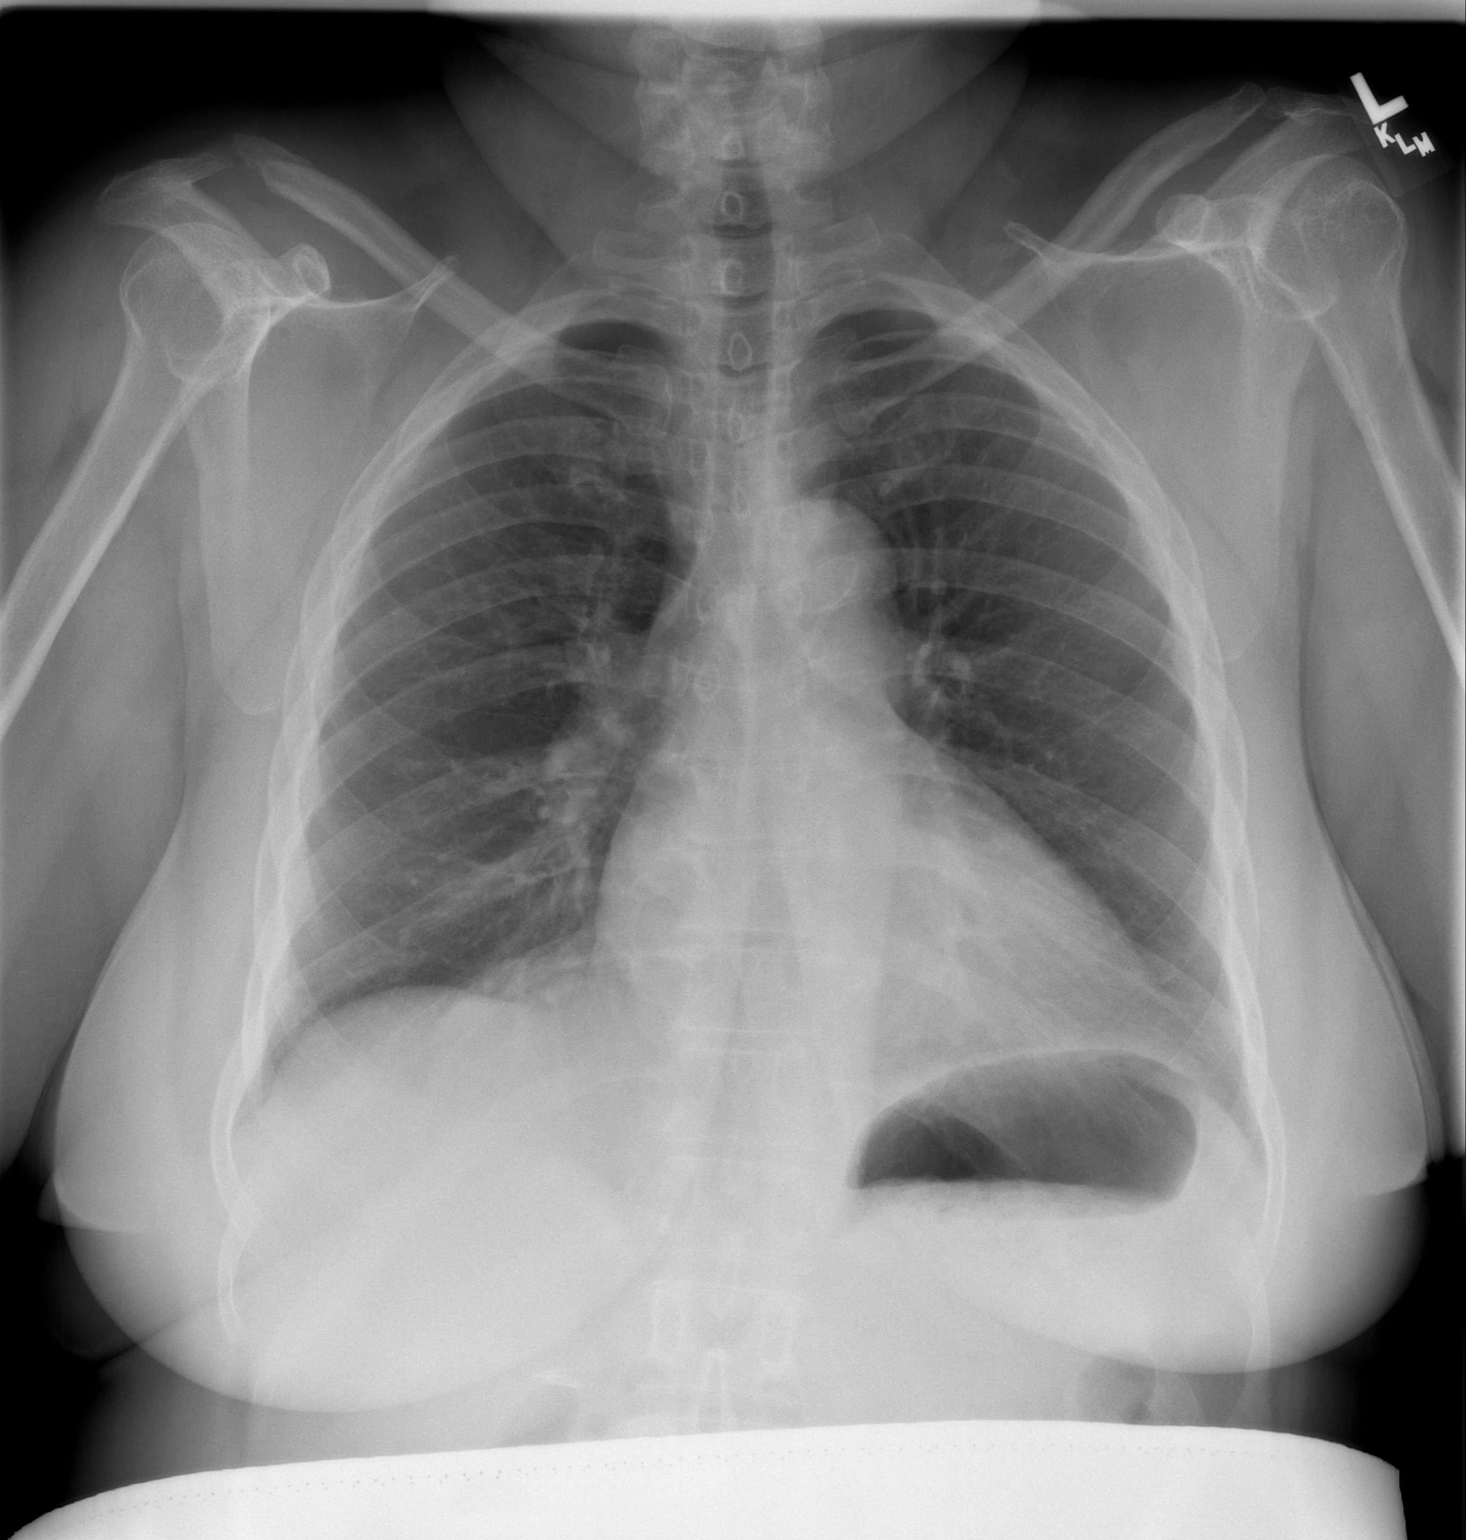

[w chest lat]
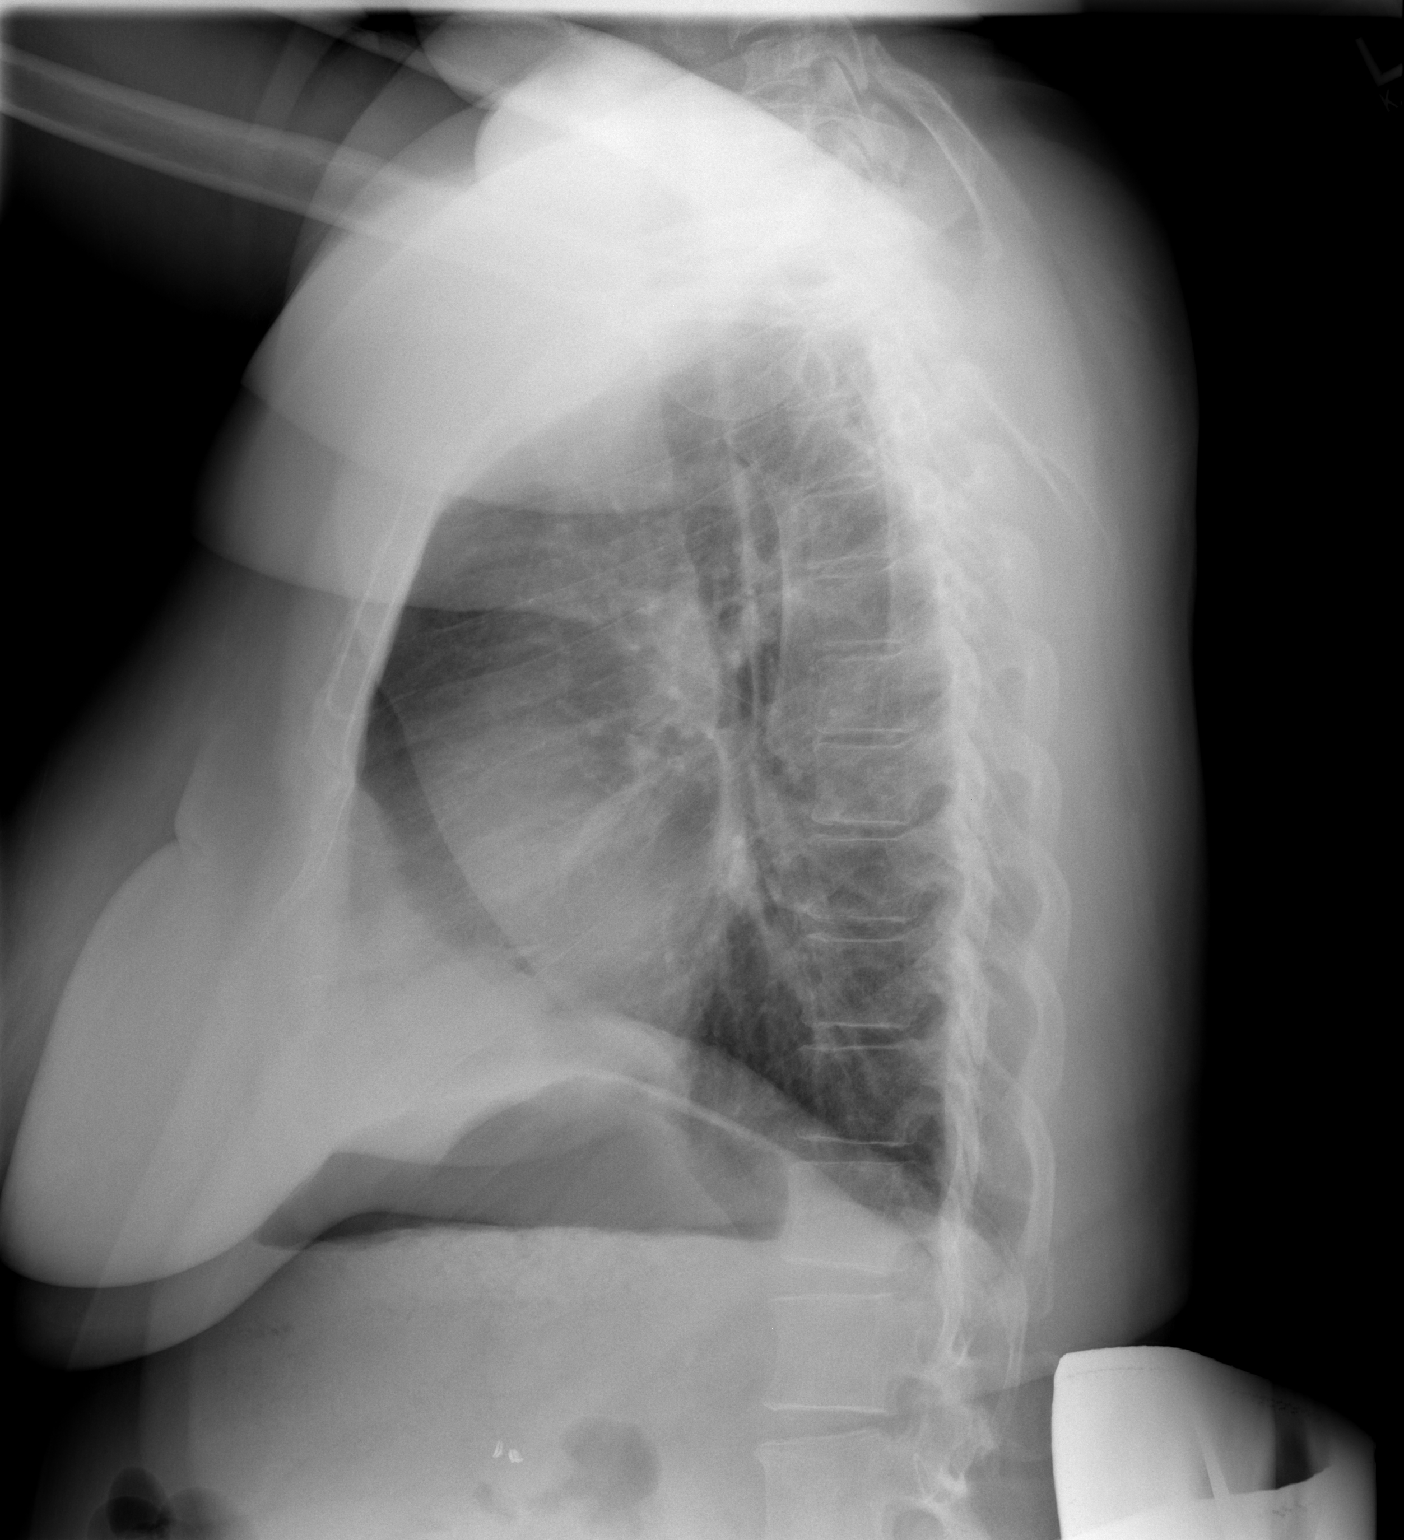

[2 of 2 positions shown; findings below may reference images not displayed]

FINDINGS: No active infiltrate or effusion is seen. Mediastinal contours are
stable. The heart is borderline enlarged and stable. No bony
abnormality is seen. Surgical clips are present within the right
upper quadrant from prior cholecystectomy.
IMPRESSION: No active lung disease. Stable borderline cardiomegaly.

## 2014-12-09 DIAGNOSIS — H5710 Ocular pain, unspecified eye: Secondary | ICD-10-CM | POA: Diagnosis not present

## 2014-12-09 DIAGNOSIS — R52 Pain, unspecified: Secondary | ICD-10-CM | POA: Diagnosis not present

## 2014-12-09 DIAGNOSIS — R51 Headache: Secondary | ICD-10-CM | POA: Diagnosis not present

## 2014-12-09 DIAGNOSIS — M25579 Pain in unspecified ankle and joints of unspecified foot: Secondary | ICD-10-CM | POA: Diagnosis not present

## 2014-12-10 DIAGNOSIS — S83241D Other tear of medial meniscus, current injury, right knee, subsequent encounter: Secondary | ICD-10-CM | POA: Diagnosis not present

## 2014-12-10 DIAGNOSIS — M25561 Pain in right knee: Secondary | ICD-10-CM | POA: Diagnosis not present

## 2014-12-10 DIAGNOSIS — J45998 Other asthma: Secondary | ICD-10-CM | POA: Diagnosis not present

## 2014-12-17 ENCOUNTER — Ambulatory Visit: Payer: Self-pay | Admitting: Diagnostic Neuroimaging

## 2014-12-18 ENCOUNTER — Encounter: Payer: Self-pay | Admitting: Diagnostic Neuroimaging

## 2014-12-30 ENCOUNTER — Ambulatory Visit: Payer: Self-pay | Admitting: Diagnostic Neuroimaging

## 2015-01-01 ENCOUNTER — Encounter: Payer: Self-pay | Admitting: Diagnostic Neuroimaging

## 2015-01-01 ENCOUNTER — Ambulatory Visit (INDEPENDENT_AMBULATORY_CARE_PROVIDER_SITE_OTHER): Payer: Medicare Other | Admitting: Diagnostic Neuroimaging

## 2015-01-01 ENCOUNTER — Encounter (INDEPENDENT_AMBULATORY_CARE_PROVIDER_SITE_OTHER): Payer: Self-pay

## 2015-01-01 VITALS — BP 115/72 | HR 82 | Ht <= 58 in | Wt 174.0 lb

## 2015-01-01 DIAGNOSIS — G43709 Chronic migraine without aura, not intractable, without status migrainosus: Secondary | ICD-10-CM

## 2015-01-01 MED ORDER — AMITRIPTYLINE HCL 25 MG PO TABS
25.0000 mg | ORAL_TABLET | Freq: Every day | ORAL | Status: DC
Start: 2015-01-01 — End: 2015-07-29

## 2015-01-01 MED ORDER — TOPIRAMATE 100 MG PO TABS: 100.0000 mg | ORAL_TABLET | Freq: Two times a day (BID) | ORAL | Status: DC

## 2015-01-01 NOTE — Patient Instructions (Signed)
Thank you for coming to see [place] at [facility]. I hope we have been able to provide you high quality care today.  You may receive a patient satisfaction survey over the next few weeks. We would appreciate your feedback and comments so that we may continue to improve ourselves and the health of our patients.  - continue topiramate 100mg  twice a day. - start amitriptyline 25mg  at bedtime   ~~~~~~~~~~~~~~~~~~~~~~~~~~~~~~~~~~~~~~~~~~~~~~~~~~~~~~~~~~~~~~~~~  DR. Marajade Lei'S GUIDE TO HAPPY AND HEALTHY LIVING These are some of my general health and wellness recommendations. Some of them may apply to you better than others. Please use common sense as you try these suggestions and feel free to ask me any questions.   ACTIVITY/FITNESS Mental, social, emotional and physical stimulation are very important for brain and body health. Try learning a new activity (arts, music, language, sports, games).  Keep moving your body to the best of your abilities. You can do this at home, inside or outside, the park, community center, gym or anywhere you like. Consider a physical therapist or personal trainer to get started. Consider the app Sworkit. Fitness trackers such as smart-watches, smart-phones or Fitbits can help as well.   NUTRITION Eat more plants: colorful vegetables, nuts, seeds and berries.  Eat less sugar, salt, preservatives and processed foods.  Avoid toxins such as cigarettes and alcohol.  Drink water when you are thirsty. Warm water with a slice of lemon is an excellent morning drink to start the day.  Consider these websites for more information The Nutrition Source (https://www.henry-hernandez.biz/) Precision Nutrition (WindowBlog.ch)   RELAXATION Consider practicing mindfulness meditation or other relaxation techniques such as deep breathing, prayer, yoga, tai chi, massage. See website mindful.org or the apps Headspace or  Calm to help get started.   SLEEP Try to get at least 7-8+ hours sleep per day. Regular exercise and reduced caffeine will help you sleep better. Practice good sleep hygeine techniques. See website sleep.org for more information.   PLANNING Prepare estate planning, living will, healthcare POA documents. Sometimes this is best planned with the help of an attorney. Theconversationproject.org and agingwithdignity.org are excellent resources.

## 2015-01-01 NOTE — Progress Notes (Signed)
[facility]  PATIENT: Brianna Mitchell DOB: 1955-03-26  REFERRING CLINICIAN: Odem HISTORY FROM: patient  REASON FOR VISIT: follow up   HISTORICAL  CHIEF COMPLAINT:  Chief Complaint  Patient presents with  . Migraine    rm 7  . Follow-up    10 months    HISTORY OF PRESENT ILLNESS:   UPDATE 01/01/15: Since last visit, still with daily HA. Also with 5 severe migraines per week.  PRIOR HPI (03/12/14): 59 year old ambidextrous female here for evaluation of headaches. Since age 64 years old patient has had intermittent global, severe throbbing headaches, with blurred vision, irritability, nausea and photophobia and phonophobia. Around 2008 patient was evaluated by headache and wellness center, dx'd with migraine, and prescribed topiramate 25 mg twice a day. Patient then followed up with PCP who increased this to 50 mg twice a day for past 2-3 yrs. Patient continues to have multiple low-grade headaches per day. She has 3 severe migraine headaches per week. She typically tries to calm herself, relax, sometimes takes half tablet of generic Excedrin Migraine tablet. She reports allergy to aspirin and this medication has aspirin in it. She reports some itching sensation when she takes it. Patient reports remote sleep study testing more than 10 years ago but does not know the results. She is not on CPAP therapy. She has significant insomnia which she feels is related to her chronic back pain. She has snoring, obesity, HTN  and daytime sleepiness. No specific other triggering factors for headache.   REVIEW OF SYSTEMS: Full 14 system review of systems performed and notable only for light sens blurred vision excess thirst joint pain weakness headache numbness restless legs daytime sleepiness.   ALLERGIES: Allergies  Allergen Reactions  . Aspirin Swelling  . Sulfa Antibiotics Swelling  . Demerol Rash    HOME MEDICATIONS: Outpatient Prescriptions Prior to Visit  Medication  Sig Dispense Refill  . albuterol (PROVENTIL HFA;VENTOLIN HFA) 108 (90 BASE) MCG/ACT inhaler Inhale 2 puffs into the lungs every 6 (six) hours as needed. For shortness of breath    . albuterol (PROVENTIL) (2.5 MG/3ML) 0.083% nebulizer solution Take 2.5 mg by nebulization every 6 (six) hours as needed. For shortness of breath    . beclomethasone (QVAR) 80 MCG/ACT inhaler Inhale 1 puff into the lungs daily with lunch.    . dexlansoprazole (DEXILANT) 60 MG capsule Take 60 mg by mouth daily.    Marland Kitchen EVZIO 0.4 MG/0.4ML SOAJ   0  . HUMALOG KWIKPEN 100 UNIT/ML KiwkPen     . levothyroxine (SYNTHROID, LEVOTHROID) 75 MCG tablet Take 75 mcg by mouth daily.    . ondansetron (ZOFRAN-ODT) 4 MG disintegrating tablet Take 1 tablet (4 mg total) by mouth every 8 (eight) hours as needed for nausea or vomiting. 50 tablet 0  . ONE TOUCH ULTRA TEST test strip     . OPANA ER, CRUSH RESISTANT, 40 MG T12A Take 1 tablet by mouth every 8 (eight) hours.  0  . polyethylene glycol (MIRALAX / GLYCOLAX) packet Take 17 g by mouth daily. 24 each 0  . rizatriptan (MAXALT-MLT) 10 MG disintegrating tablet Take 1 tablet (10 mg total) by mouth as needed for migraine. May repeat in 2 hours if needed 9 tablet 11  . rosuvastatin (CRESTOR) 40 MG tablet Take 40 mg by mouth daily.    Marland Kitchen topiramate (TOPAMAX) 100 MG tablet Take 1 tablet (100 mg total) by mouth 2 (two) times daily. 60 tablet 12  . Oxycodone HCl 20 MG TABS  0   No facility-administered medications prior to visit.    PAST MEDICAL HISTORY: Past Medical History  Diagnosis Date  . Diabetes mellitus   . Asthma   . Thyroid disease   . Ganglion cyst   . Fibroid tumor   . Carpal tunnel syndrome   . Back pain   . Shoulder pain   . Tennis elbow   . Hypothyroidism   . Headache(784.0)   . Shortness of breath   . Hypertension     dr Einar Gip       PAST SURGICAL HISTORY: Past Surgical History  Procedure Laterality Date  . Tubal ligation    . Abdominal hysterectomy    .  Pelvic bone removed    . Rotator cuff repair    . Cholecystectomy    . Back surgery      x5  . Thyroid surgery      LUMP REMOVED  . Anterior lat lumbar fusion N/A 01/03/2013    Procedure: X LIF (Extreme Lateral Interbody Fusion) L3-L4,  (1 LEVEL) ;  Surgeon: Melina Schools, MD;  Location: [facility];  Service: Orthopedics;  Laterality: N/A;  . Left heart catheterization with coronary angiogram N/A 04/27/2011    Procedure: LEFT HEART CATHETERIZATION WITH CORONARY ANGIOGRAM;  Surgeon: Laverda Page, MD;  Location: [facility] CATH LAB;  Service: Cardiovascular;  Laterality: N/A;  . Knee arthroscopy Right 04/2014    FAMILY HISTORY: Family History  Problem Relation Age of Onset  . Heart disease Mother   . Heart disease Father   . Breast cancer Sister   . Heart disease Sister   . Heart disease Brother     SOCIAL HISTORY:  Social History   Social History  . Marital Status: Divorced    Spouse Name: N/A  . Number of Children: N/A  . Years of Education: N/A   Occupational History  . Not on file.   Social History Main Topics  . Smoking status: Never Smoker   . Smokeless tobacco: Not on file  . Alcohol Use: No  . Drug Use: No  . Sexual Activity: Not on file   Other Topics Concern  . Not on file   Social History Narrative     PHYSICAL EXAM  Filed Vitals:   01/01/15 1403  BP: 115/72  Pulse: 82  Height: 4\' 9"  (1.448 m)  Weight: 174 lb (78.926 kg)    Body mass index is 37.64 kg/(m^2).  No exam data present  No flowsheet data found.  GENERAL EXAM: Patient is in no distress; well developed, nourished and groomed; neck is supple  CARDIOVASCULAR: Regular rate and rhythm, no murmurs, no carotid bruits  NEUROLOGIC: MENTAL STATUS: awake, alert, language fluent, comprehension intact, naming intact, fund of knowledge appropriate CRANIAL NERVE: pupils equal and reactive to light, visual fields full to confrontation, extraocular muscles intact, no nystagmus, facial sensation and  strength symmetric, hearing intact, palate elevates symmetrically, uvula midline, shoulder shrug symmetric, tongue midline. MOTOR: normal bulk and tone, full strength in the BUE, BLE SENSORY: normal and symmetric to light touch, temperature, vibration  COORDINATION: finger-nose-finger, fine finger movements normal REFLEXES: deep tendon reflexes present and symmetric GAIT/STATION: narrow based gait; able to walk tandem; romberg is negative    DIAGNOSTIC DATA (LABS, IMAGING, TESTING) - I reviewed patient records, labs, notes, testing and imaging myself where available.  Lab Results  Component Value Date   WBC 6.9 12/27/2012   HGB 10.4* 12/27/2012   HCT 32.8* 12/27/2012   MCV 87.9 12/27/2012  PLT 255 12/27/2012      Component Value Date/Time   NA 139 01/05/2013 0015   K 3.7 01/05/2013 0015   CL 103 01/05/2013 0015   CO2 27 01/05/2013 0015   GLUCOSE 179* 01/05/2013 0015   BUN 13 01/05/2013 0015   CREATININE 0.95 01/05/2013 0015   CALCIUM 9.5 01/05/2013 0015   PROT 7.1 12/02/2009 1756   ALBUMIN 3.8 12/02/2009 1756   AST 31 12/02/2009 1756   ALT 38* 12/02/2009 1756   ALKPHOS 82 12/02/2009 1756   BILITOT 0.6 12/02/2009 1756   GFRNONAA 65* 01/05/2013 0015   GFRAA 75* 01/05/2013 0015   No results found for: CHOL, HDL, LDLCALC, LDLDIRECT, TRIG, CHOLHDL Lab Results  Component Value Date   HGBA1C 11.5* 01/04/2013   No results found for: VITAMINB12 Lab Results  Component Value Date   TSH 1.577 12/02/2009    03/12/14 CT head - Negative for bleed or other acute intracranial process.    ASSESSMENT AND PLAN  59 y.o. year old female here with migraine without aura since age 64 years old.   PLAN: - continue TPX 100mg  BID + tylenol prn + rizatriptan prn  - add amitriptyline 25mg  at bedtime - re-schedule sleep study (obesity, hypertension, snoring, daytime fatigue, insomnia)  Meds ordered this encounter  Medications  . amitriptyline (ELAVIL) 25 MG tablet    Sig: Take 1  tablet (25 mg total) by mouth at bedtime.    Dispense:  30 tablet    Refill:  6  . topiramate (TOPAMAX) 100 MG tablet    Sig: Take 1 tablet (100 mg total) by mouth 2 (two) times daily.    Dispense:  60 tablet    Refill:  12   Return in about 6 months (around 07/01/2015).    Penni Bombard, MD [id number], [id number] PM Certified in Neurology, Neurophysiology and Neuroimaging  [facility] [address], [place], [place] [address] [phone]

## 2015-01-06 DIAGNOSIS — H5710 Ocular pain, unspecified eye: Secondary | ICD-10-CM | POA: Diagnosis not present

## 2015-01-06 DIAGNOSIS — R51 Headache: Secondary | ICD-10-CM | POA: Diagnosis not present

## 2015-01-06 DIAGNOSIS — R52 Pain, unspecified: Secondary | ICD-10-CM | POA: Diagnosis not present

## 2015-01-06 DIAGNOSIS — M25579 Pain in unspecified ankle and joints of unspecified foot: Secondary | ICD-10-CM | POA: Diagnosis not present

## 2015-01-22 DIAGNOSIS — H2513 Age-related nuclear cataract, bilateral: Secondary | ICD-10-CM | POA: Diagnosis not present

## 2015-01-22 DIAGNOSIS — E113293 Type 2 diabetes mellitus with mild nonproliferative diabetic retinopathy without macular edema, bilateral: Secondary | ICD-10-CM | POA: Diagnosis not present

## 2015-01-22 DIAGNOSIS — H5213 Myopia, bilateral: Secondary | ICD-10-CM | POA: Diagnosis not present

## 2015-01-24 DIAGNOSIS — J45998 Other asthma: Secondary | ICD-10-CM | POA: Diagnosis not present

## 2015-02-04 DIAGNOSIS — R51 Headache: Secondary | ICD-10-CM | POA: Diagnosis not present

## 2015-02-04 DIAGNOSIS — M25579 Pain in unspecified ankle and joints of unspecified foot: Secondary | ICD-10-CM | POA: Diagnosis not present

## 2015-02-04 DIAGNOSIS — H5710 Ocular pain, unspecified eye: Secondary | ICD-10-CM | POA: Diagnosis not present

## 2015-02-27 DIAGNOSIS — K219 Gastro-esophageal reflux disease without esophagitis: Secondary | ICD-10-CM | POA: Diagnosis not present

## 2015-02-27 DIAGNOSIS — R109 Unspecified abdominal pain: Secondary | ICD-10-CM | POA: Diagnosis not present

## 2015-02-27 DIAGNOSIS — Z1211 Encounter for screening for malignant neoplasm of colon: Secondary | ICD-10-CM | POA: Diagnosis not present

## 2015-03-04 DIAGNOSIS — J4520 Mild intermittent asthma, uncomplicated: Secondary | ICD-10-CM | POA: Diagnosis not present

## 2015-03-04 DIAGNOSIS — I158 Other secondary hypertension: Secondary | ICD-10-CM | POA: Diagnosis not present

## 2015-03-04 DIAGNOSIS — M4326 Fusion of spine, lumbar region: Secondary | ICD-10-CM | POA: Diagnosis not present

## 2015-03-11 DIAGNOSIS — H2513 Age-related nuclear cataract, bilateral: Secondary | ICD-10-CM | POA: Diagnosis not present

## 2015-03-11 DIAGNOSIS — E113293 Type 2 diabetes mellitus with mild nonproliferative diabetic retinopathy without macular edema, bilateral: Secondary | ICD-10-CM | POA: Diagnosis not present

## 2015-03-12 DIAGNOSIS — J45998 Other asthma: Secondary | ICD-10-CM | POA: Diagnosis not present

## 2015-03-21 DIAGNOSIS — Z4789 Encounter for other orthopedic aftercare: Secondary | ICD-10-CM | POA: Diagnosis not present

## 2015-04-02 DIAGNOSIS — J4520 Mild intermittent asthma, uncomplicated: Secondary | ICD-10-CM | POA: Diagnosis not present

## 2015-04-02 DIAGNOSIS — I158 Other secondary hypertension: Secondary | ICD-10-CM | POA: Diagnosis not present

## 2015-04-02 DIAGNOSIS — M4326 Fusion of spine, lumbar region: Secondary | ICD-10-CM | POA: Diagnosis not present

## 2015-04-07 DIAGNOSIS — R1013 Epigastric pain: Secondary | ICD-10-CM | POA: Diagnosis not present

## 2015-04-07 DIAGNOSIS — R112 Nausea with vomiting, unspecified: Secondary | ICD-10-CM | POA: Diagnosis not present

## 2015-04-07 DIAGNOSIS — Z1211 Encounter for screening for malignant neoplasm of colon: Secondary | ICD-10-CM | POA: Diagnosis not present

## 2015-04-07 DIAGNOSIS — K5900 Constipation, unspecified: Secondary | ICD-10-CM | POA: Diagnosis not present

## 2015-04-14 DIAGNOSIS — M961 Postlaminectomy syndrome, not elsewhere classified: Secondary | ICD-10-CM | POA: Diagnosis not present

## 2015-04-14 DIAGNOSIS — M47816 Spondylosis without myelopathy or radiculopathy, lumbar region: Secondary | ICD-10-CM | POA: Diagnosis not present

## 2015-04-23 DIAGNOSIS — M961 Postlaminectomy syndrome, not elsewhere classified: Secondary | ICD-10-CM | POA: Diagnosis not present

## 2015-04-28 DIAGNOSIS — J45998 Other asthma: Secondary | ICD-10-CM | POA: Diagnosis not present

## 2015-04-29 DIAGNOSIS — M4326 Fusion of spine, lumbar region: Secondary | ICD-10-CM | POA: Diagnosis not present

## 2015-04-29 DIAGNOSIS — J4520 Mild intermittent asthma, uncomplicated: Secondary | ICD-10-CM | POA: Diagnosis not present

## 2015-04-29 DIAGNOSIS — I158 Other secondary hypertension: Secondary | ICD-10-CM | POA: Diagnosis not present

## 2015-04-30 DIAGNOSIS — M47816 Spondylosis without myelopathy or radiculopathy, lumbar region: Secondary | ICD-10-CM | POA: Diagnosis not present

## 2015-05-09 DIAGNOSIS — E161 Other hypoglycemia: Secondary | ICD-10-CM | POA: Diagnosis not present

## 2015-05-09 DIAGNOSIS — E162 Hypoglycemia, unspecified: Secondary | ICD-10-CM | POA: Diagnosis not present

## 2015-05-12 DIAGNOSIS — M47816 Spondylosis without myelopathy or radiculopathy, lumbar region: Secondary | ICD-10-CM | POA: Diagnosis not present

## 2015-05-12 DIAGNOSIS — M961 Postlaminectomy syndrome, not elsewhere classified: Secondary | ICD-10-CM | POA: Diagnosis not present

## 2015-05-12 DIAGNOSIS — G894 Chronic pain syndrome: Secondary | ICD-10-CM | POA: Diagnosis not present

## 2015-05-16 DIAGNOSIS — I10 Essential (primary) hypertension: Secondary | ICD-10-CM | POA: Diagnosis not present

## 2015-05-16 DIAGNOSIS — E7800 Pure hypercholesterolemia, unspecified: Secondary | ICD-10-CM | POA: Diagnosis not present

## 2015-05-16 DIAGNOSIS — E1165 Type 2 diabetes mellitus with hyperglycemia: Secondary | ICD-10-CM | POA: Diagnosis not present

## 2015-05-16 DIAGNOSIS — E039 Hypothyroidism, unspecified: Secondary | ICD-10-CM | POA: Diagnosis not present

## 2015-05-27 ENCOUNTER — Encounter: Payer: Self-pay | Admitting: Physical Medicine & Rehabilitation

## 2015-05-27 DIAGNOSIS — J45998 Other asthma: Secondary | ICD-10-CM | POA: Diagnosis not present

## 2015-05-28 DIAGNOSIS — I158 Other secondary hypertension: Secondary | ICD-10-CM | POA: Diagnosis not present

## 2015-05-28 DIAGNOSIS — J4520 Mild intermittent asthma, uncomplicated: Secondary | ICD-10-CM | POA: Diagnosis not present

## 2015-05-28 DIAGNOSIS — M4326 Fusion of spine, lumbar region: Secondary | ICD-10-CM | POA: Diagnosis not present

## 2015-06-10 DIAGNOSIS — M47816 Spondylosis without myelopathy or radiculopathy, lumbar region: Secondary | ICD-10-CM | POA: Diagnosis not present

## 2015-06-25 DIAGNOSIS — M4326 Fusion of spine, lumbar region: Secondary | ICD-10-CM | POA: Diagnosis not present

## 2015-06-25 DIAGNOSIS — J4520 Mild intermittent asthma, uncomplicated: Secondary | ICD-10-CM | POA: Diagnosis not present

## 2015-06-25 DIAGNOSIS — I158 Other secondary hypertension: Secondary | ICD-10-CM | POA: Diagnosis not present

## 2015-06-27 DIAGNOSIS — M47816 Spondylosis without myelopathy or radiculopathy, lumbar region: Secondary | ICD-10-CM | POA: Diagnosis not present

## 2015-06-27 DIAGNOSIS — G894 Chronic pain syndrome: Secondary | ICD-10-CM | POA: Diagnosis not present

## 2015-06-27 DIAGNOSIS — M961 Postlaminectomy syndrome, not elsewhere classified: Secondary | ICD-10-CM | POA: Diagnosis not present

## 2015-07-01 ENCOUNTER — Ambulatory Visit: Payer: Medicare Other | Admitting: Diagnostic Neuroimaging

## 2015-07-03 DIAGNOSIS — J45998 Other asthma: Secondary | ICD-10-CM | POA: Diagnosis not present

## 2015-07-15 ENCOUNTER — Other Ambulatory Visit: Payer: Self-pay | Admitting: Diagnostic Neuroimaging

## 2015-07-23 DIAGNOSIS — I158 Other secondary hypertension: Secondary | ICD-10-CM | POA: Diagnosis not present

## 2015-07-23 DIAGNOSIS — J4520 Mild intermittent asthma, uncomplicated: Secondary | ICD-10-CM | POA: Diagnosis not present

## 2015-07-23 DIAGNOSIS — M4326 Fusion of spine, lumbar region: Secondary | ICD-10-CM | POA: Diagnosis not present

## 2015-07-29 ENCOUNTER — Ambulatory Visit (INDEPENDENT_AMBULATORY_CARE_PROVIDER_SITE_OTHER): Payer: Medicare Other | Admitting: Diagnostic Neuroimaging

## 2015-07-29 ENCOUNTER — Encounter: Payer: Self-pay | Admitting: Diagnostic Neuroimaging

## 2015-07-29 VITALS — BP 113/82 | HR 82 | Ht <= 58 in | Wt 171.8 lb

## 2015-07-29 DIAGNOSIS — G43019 Migraine without aura, intractable, without status migrainosus: Secondary | ICD-10-CM

## 2015-07-29 DIAGNOSIS — G43709 Chronic migraine without aura, not intractable, without status migrainosus: Secondary | ICD-10-CM

## 2015-07-29 MED ORDER — TOPIRAMATE 100 MG PO TABS: 100.0000 mg | ORAL_TABLET | Freq: Two times a day (BID) | ORAL | Status: DC

## 2015-07-29 MED ORDER — RIZATRIPTAN BENZOATE 10 MG PO TBDP
10.0000 mg | ORAL_TABLET | ORAL | Status: DC | PRN
Start: 2015-07-29 — End: 2016-07-05

## 2015-07-29 NOTE — Progress Notes (Signed)
[facility]  PATIENT: Brianna Mitchell DOB: January 11, 1956  REFERRING CLINICIAN: Odem HISTORY FROM: patient  REASON FOR VISIT: follow up   HISTORICAL  CHIEF COMPLAINT:  Chief Complaint  Patient presents with  . Migraine    rm 6, "migraines stay with me; I can't sleep at all; when I'm sleeping I still have the headache;  rizatriptan doesn't work; I don't elavil-makes me sleep longer than I want to sleep"  . Follow-up    6 month    HISTORY OF PRESENT ILLNESS:   UPDATE 61/13/17: Since last visit, symptoms stable. Daily HA + intermittent headaches. Patient had to cancel sleep study due to family issues.   UPDATE 01/01/15: Since last visit, still with daily HA. Also with 5 severe migraines per week.  PRIOR HPI (03/12/14): 60 year old ambidextrous female here for evaluation of headaches. Since age 35 years old patient has had intermittent global, severe throbbing headaches, with blurred vision, irritability, nausea and photophobia and phonophobia. Around 2008 patient was evaluated by headache and wellness center, dx'd with migraine, and prescribed topiramate 25 mg twice a day. Patient then followed up with PCP who increased this to 50 mg twice a day for past 2-3 yrs. Patient continues to have multiple low-grade headaches per day. She has 3 severe migraine headaches per week. She typically tries to calm herself, relax, sometimes takes half tablet of generic Excedrin Migraine tablet. She reports allergy to aspirin and this medication has aspirin in it. She reports some itching sensation when she takes it. Patient reports remote sleep study testing more than 10 years ago but does not know the results. She is not on CPAP therapy. She has significant insomnia which she feels is related to her chronic back pain. She has snoring, obesity, HTN  and daytime sleepiness. No specific other triggering factors for headache.   REVIEW OF SYSTEMS: Full 14 system review of systems performed and  negative except joint pain and back pain and headaches.   ALLERGIES: Allergies  Allergen Reactions  . Aspirin Swelling  . Sulfa Antibiotics Swelling  . Demerol Rash    HOME MEDICATIONS: Outpatient Prescriptions Prior to Visit  Medication Sig Dispense Refill  . albuterol (PROVENTIL HFA;VENTOLIN HFA) 108 (90 BASE) MCG/ACT inhaler Inhale 2 puffs into the lungs every 6 (six) hours as needed. For shortness of breath    . albuterol (PROVENTIL) (2.5 MG/3ML) 0.083% nebulizer solution Take 2.5 mg by nebulization every 6 (six) hours as needed. For shortness of breath    . amitriptyline (ELAVIL) 25 MG tablet Take 1 tablet (25 mg total) by mouth at bedtime. 30 tablet 6  . beclomethasone (QVAR) 80 MCG/ACT inhaler Inhale 1 puff into the lungs daily with lunch.    . dexlansoprazole (DEXILANT) 60 MG capsule Take 60 mg by mouth daily.    Marland Kitchen EVZIO 0.4 MG/0.4ML SOAJ   0  . HUMALOG KWIKPEN 100 UNIT/ML KiwkPen     . levothyroxine (SYNTHROID, LEVOTHROID) 75 MCG tablet Take 75 mcg by mouth daily.    . ondansetron (ZOFRAN-ODT) 4 MG disintegrating tablet Take 1 tablet (4 mg total) by mouth every 8 (eight) hours as needed for nausea or vomiting. 50 tablet 0  . ONE TOUCH ULTRA TEST test strip     . OPANA ER, CRUSH RESISTANT, 40 MG T12A Take 1 tablet by mouth every 8 (eight) hours.  0  . oxyCODONE-acetaminophen (PERCOCET/ROXICET) 5-325 MG tablet Take 1 tablet by mouth 3 (three) times daily as needed. for pain  0  .  polyethylene glycol (MIRALAX / GLYCOLAX) packet Take 17 g by mouth daily. 24 each 0  . rizatriptan (MAXALT-MLT) 10 MG disintegrating tablet Take 1 tablet (10 mg total) by mouth as needed for migraine. May repeat in 2 hours if needed 9 tablet 11  . rosuvastatin (CRESTOR) 40 MG tablet Take 40 mg by mouth daily.    Marland Kitchen topiramate (TOPAMAX) 100 MG tablet Take 1 tablet (100 mg total) by mouth 2 (two) times daily. 60 tablet 12   No facility-administered medications prior to visit.    PAST MEDICAL  HISTORY: Past Medical History  Diagnosis Date  . Diabetes mellitus   . Asthma   . Thyroid disease   . Ganglion cyst   . Fibroid tumor   . Carpal tunnel syndrome   . Back pain   . Shoulder pain   . Tennis elbow   . Hypothyroidism   . Headache(784.0)   . Shortness of breath   . Hypertension     dr Einar Gip       PAST SURGICAL HISTORY: Past Surgical History  Procedure Laterality Date  . Tubal ligation    . Abdominal hysterectomy    . Pelvic bone removed    . Rotator cuff repair    . Cholecystectomy    . Back surgery      x5  . Thyroid surgery      LUMP REMOVED  . Anterior lat lumbar fusion N/A 01/03/2013    Procedure: X LIF (Extreme Lateral Interbody Fusion) L3-L4,  (1 LEVEL) ;  Surgeon: Melina Schools, MD;  Location: [facility];  Service: Orthopedics;  Laterality: N/A;  . Left heart catheterization with coronary angiogram N/A 04/27/2011    Procedure: LEFT HEART CATHETERIZATION WITH CORONARY ANGIOGRAM;  Surgeon: Laverda Page, MD;  Location: [facility] CATH LAB;  Service: Cardiovascular;  Laterality: N/A;  . Knee arthroscopy Right 04/2014    FAMILY HISTORY: Family History  Problem Relation Age of Onset  . Heart disease Mother   . Heart disease Father   . Breast cancer Sister   . Heart disease Sister   . Heart disease Brother     SOCIAL HISTORY:  Social History   Social History  . Marital Status: Divorced    Spouse Name: N/A  . Number of Children: N/A  . Years of Education: N/A   Occupational History  . Not on file.   Social History Main Topics  . Smoking status: Never Smoker   . Smokeless tobacco: Not on file  . Alcohol Use: No  . Drug Use: No  . Sexual Activity: Not on file   Other Topics Concern  . Not on file   Social History Narrative     PHYSICAL EXAM  Filed Vitals:   07/29/15 1421  BP: 113/82  Pulse: 82  Height: 4\' 9"  (1.448 m)  Weight: 171 lb 12.8 oz (77.928 kg)    Body mass index is 37.17 kg/(m^2).  No exam data present  No flowsheet data  found.  GENERAL EXAM: Patient is in no distress; well developed, nourished and groomed; neck is supple  CARDIOVASCULAR: Regular rate and rhythm, no murmurs, no carotid bruits  NEUROLOGIC: MENTAL STATUS: awake, alert, language fluent, comprehension intact, naming intact, fund of knowledge appropriate CRANIAL NERVE: pupils equal and reactive to light, visual fields full to confrontation, extraocular muscles intact, no nystagmus, facial sensation and strength symmetric, hearing intact, palate elevates symmetrically, uvula midline, shoulder shrug symmetric, tongue midline. MOTOR: normal bulk and tone, full strength in the BUE,  BLE SENSORY: normal and symmetric to light touch, temperature, vibration  COORDINATION: finger-nose-finger, fine finger movements normal REFLEXES: deep tendon reflexes present and symmetric GAIT/STATION: narrow based gait; able to walk tandem; romberg is negative    DIAGNOSTIC DATA (LABS, IMAGING, TESTING) - I reviewed patient records, labs, notes, testing and imaging myself where available.  Lab Results  Component Value Date   WBC 6.9 12/27/2012   HGB 10.4* 12/27/2012   HCT 32.8* 12/27/2012   MCV 87.9 12/27/2012   PLT 255 12/27/2012      Component Value Date/Time   NA 139 01/05/2013 0015   K 3.7 01/05/2013 0015   CL 103 01/05/2013 0015   CO2 27 01/05/2013 0015   GLUCOSE 179* 01/05/2013 0015   BUN 13 01/05/2013 0015   CREATININE 0.95 01/05/2013 0015   CALCIUM 9.5 01/05/2013 0015   PROT 7.1 12/02/2009 1756   ALBUMIN 3.8 12/02/2009 1756   AST 31 12/02/2009 1756   ALT 38* 12/02/2009 1756   ALKPHOS 82 12/02/2009 1756   BILITOT 0.6 12/02/2009 1756   GFRNONAA 65* 01/05/2013 0015   GFRAA 75* 01/05/2013 0015   No results found for: CHOL, HDL, LDLCALC, LDLDIRECT, TRIG, CHOLHDL Lab Results  Component Value Date   HGBA1C 11.5* 01/04/2013   No results found for: VITAMINB12 Lab Results  Component Value Date   TSH 1.577 12/02/2009    03/12/14 CT head -  Negative for bleed or other acute intracranial process.    ASSESSMENT AND PLAN  60 y.o. year old female here with migraine without aura since age 2 years old.   Dx:  Chronic migraine without aura without status migrainosus, not intractable  Intractable migraine without aura and without status migrainosus    PLAN: - continue TPX 100mg  BID + tylenol prn + rizatriptan prn  - follow up sleep study; patient had cancelled; encouraged patient to re-scheduled (obesity, hypertension, snoring, daytime fatigue, insomnia)  Meds ordered this encounter  Medications  . topiramate (TOPAMAX) 100 MG tablet    Sig: Take 1 tablet (100 mg total) by mouth 2 (two) times daily.    Dispense:  60 tablet    Refill:  12  . rizatriptan (MAXALT-MLT) 10 MG disintegrating tablet    Sig: Take 1 tablet (10 mg total) by mouth as needed for migraine. May repeat in 2 hours if needed    Dispense:  9 tablet    Refill:  11   Return in about 1 year (around 07/28/2016).    Penni Bombard, MD [id number], [id number] PM Certified in Neurology, Neurophysiology and Neuroimaging  [facility] [address], [place], [place] [address] [phone]

## 2015-08-12 DIAGNOSIS — S83241D Other tear of medial meniscus, current injury, right knee, subsequent encounter: Secondary | ICD-10-CM | POA: Diagnosis not present

## 2015-08-21 DIAGNOSIS — I158 Other secondary hypertension: Secondary | ICD-10-CM | POA: Diagnosis not present

## 2015-08-21 DIAGNOSIS — M4326 Fusion of spine, lumbar region: Secondary | ICD-10-CM | POA: Diagnosis not present

## 2015-08-21 DIAGNOSIS — J4520 Mild intermittent asthma, uncomplicated: Secondary | ICD-10-CM | POA: Diagnosis not present

## 2015-09-10 DIAGNOSIS — J45998 Other asthma: Secondary | ICD-10-CM | POA: Diagnosis not present

## 2015-09-17 DIAGNOSIS — M4326 Fusion of spine, lumbar region: Secondary | ICD-10-CM | POA: Diagnosis not present

## 2015-09-17 DIAGNOSIS — E7800 Pure hypercholesterolemia, unspecified: Secondary | ICD-10-CM | POA: Diagnosis not present

## 2015-09-17 DIAGNOSIS — I158 Other secondary hypertension: Secondary | ICD-10-CM | POA: Diagnosis not present

## 2015-09-17 DIAGNOSIS — J4520 Mild intermittent asthma, uncomplicated: Secondary | ICD-10-CM | POA: Diagnosis not present

## 2015-10-08 DIAGNOSIS — R21 Rash and other nonspecific skin eruption: Secondary | ICD-10-CM | POA: Diagnosis not present

## 2015-10-08 DIAGNOSIS — Z79899 Other long term (current) drug therapy: Secondary | ICD-10-CM | POA: Diagnosis not present

## 2015-10-08 DIAGNOSIS — E1165 Type 2 diabetes mellitus with hyperglycemia: Secondary | ICD-10-CM | POA: Diagnosis not present

## 2015-10-08 DIAGNOSIS — R52 Pain, unspecified: Secondary | ICD-10-CM | POA: Diagnosis not present

## 2015-10-22 DIAGNOSIS — M25512 Pain in left shoulder: Secondary | ICD-10-CM | POA: Diagnosis not present

## 2015-10-22 DIAGNOSIS — M25521 Pain in right elbow: Secondary | ICD-10-CM | POA: Diagnosis not present

## 2015-10-25 ENCOUNTER — Other Ambulatory Visit: Payer: Self-pay | Admitting: Orthopedic Surgery

## 2015-10-25 DIAGNOSIS — M25512 Pain in left shoulder: Secondary | ICD-10-CM

## 2015-10-30 ENCOUNTER — Telehealth: Payer: Self-pay | Admitting: *Deleted

## 2015-10-30 NOTE — Telephone Encounter (Signed)
[place] and advised pharmacist that per [facility], PA is not required for topiramate. Pharmacist stated to disregard, prescription was just filled. He stated he was not sure why PA request was generated.

## 2015-10-30 NOTE — Telephone Encounter (Addendum)
Paper PA for topiramate started, faxed to [facility] [phone]. Unable to complete electronically on cover my meds.  4 pm, Received fax from [place], PA not required at this time.

## 2015-11-03 DIAGNOSIS — J45998 Other asthma: Secondary | ICD-10-CM | POA: Diagnosis not present

## 2015-11-04 ENCOUNTER — Ambulatory Visit
Admission: RE | Admit: 2015-11-04 | Discharge: 2015-11-04 | Disposition: A | Discharge status: Home / Self Care | Payer: Medicare Other | Source: Ambulatory Visit | Attending: Orthopedic Surgery | Admitting: Orthopedic Surgery

## 2015-11-04 DIAGNOSIS — M25512 Pain in left shoulder: Secondary | ICD-10-CM

## 2015-11-04 DIAGNOSIS — S46012A Strain of muscle(s) and tendon(s) of the rotator cuff of left shoulder, initial encounter: Secondary | ICD-10-CM | POA: Diagnosis not present

## 2015-11-10 DIAGNOSIS — E7800 Pure hypercholesterolemia, unspecified: Secondary | ICD-10-CM | POA: Diagnosis not present

## 2015-11-10 DIAGNOSIS — M4326 Fusion of spine, lumbar region: Secondary | ICD-10-CM | POA: Diagnosis not present

## 2015-11-10 DIAGNOSIS — J4520 Mild intermittent asthma, uncomplicated: Secondary | ICD-10-CM | POA: Diagnosis not present

## 2015-11-10 DIAGNOSIS — I158 Other secondary hypertension: Secondary | ICD-10-CM | POA: Diagnosis not present

## 2015-11-11 ENCOUNTER — Other Ambulatory Visit: Payer: Self-pay | Admitting: Endocrinology

## 2015-11-11 DIAGNOSIS — E041 Nontoxic single thyroid nodule: Secondary | ICD-10-CM

## 2015-11-11 DIAGNOSIS — E7800 Pure hypercholesterolemia, unspecified: Secondary | ICD-10-CM | POA: Diagnosis not present

## 2015-11-11 DIAGNOSIS — I10 Essential (primary) hypertension: Secondary | ICD-10-CM | POA: Diagnosis not present

## 2015-11-11 DIAGNOSIS — E039 Hypothyroidism, unspecified: Secondary | ICD-10-CM | POA: Diagnosis not present

## 2015-11-11 DIAGNOSIS — E1165 Type 2 diabetes mellitus with hyperglycemia: Secondary | ICD-10-CM | POA: Diagnosis not present

## 2015-11-12 DIAGNOSIS — M25512 Pain in left shoulder: Secondary | ICD-10-CM | POA: Diagnosis not present

## 2015-11-14 DIAGNOSIS — E1165 Type 2 diabetes mellitus with hyperglycemia: Secondary | ICD-10-CM | POA: Diagnosis not present

## 2015-11-19 ENCOUNTER — Ambulatory Visit
Admission: RE | Admit: 2015-11-19 | Discharge: 2015-11-19 | Disposition: A | Discharge status: Home / Self Care | Payer: Medicare Other | Source: Ambulatory Visit | Attending: Endocrinology | Admitting: Endocrinology

## 2015-11-19 DIAGNOSIS — E041 Nontoxic single thyroid nodule: Secondary | ICD-10-CM | POA: Diagnosis not present

## 2015-11-21 ENCOUNTER — Other Ambulatory Visit: Payer: Self-pay | Admitting: Orthopedic Surgery

## 2015-11-27 DIAGNOSIS — E113293 Type 2 diabetes mellitus with mild nonproliferative diabetic retinopathy without macular edema, bilateral: Secondary | ICD-10-CM | POA: Diagnosis not present

## 2015-11-27 DIAGNOSIS — H2513 Age-related nuclear cataract, bilateral: Secondary | ICD-10-CM | POA: Diagnosis not present

## 2015-12-05 ENCOUNTER — Encounter (HOSPITAL_COMMUNITY): Payer: Self-pay

## 2015-12-05 ENCOUNTER — Encounter (HOSPITAL_COMMUNITY)
Admission: RE | Admit: 2015-12-05 | Discharge: 2015-12-05 | Disposition: A | Discharge status: Home / Self Care | Payer: Medicare Other | Source: Ambulatory Visit | Attending: Orthopedic Surgery | Admitting: Orthopedic Surgery

## 2015-12-05 DIAGNOSIS — I158 Other secondary hypertension: Secondary | ICD-10-CM | POA: Diagnosis not present

## 2015-12-05 DIAGNOSIS — Z01818 Encounter for other preprocedural examination: Secondary | ICD-10-CM | POA: Insufficient documentation

## 2015-12-05 DIAGNOSIS — J4520 Mild intermittent asthma, uncomplicated: Secondary | ICD-10-CM | POA: Diagnosis not present

## 2015-12-05 DIAGNOSIS — M4326 Fusion of spine, lumbar region: Secondary | ICD-10-CM | POA: Diagnosis not present

## 2015-12-05 DIAGNOSIS — E7800 Pure hypercholesterolemia, unspecified: Secondary | ICD-10-CM | POA: Diagnosis not present

## 2015-12-05 DIAGNOSIS — I10 Essential (primary) hypertension: Secondary | ICD-10-CM | POA: Insufficient documentation

## 2015-12-05 HISTORY — DX: Enthesopathy, unspecified: M77.9

## 2015-12-05 HISTORY — DX: Anemia, unspecified: D64.9

## 2015-12-05 HISTORY — DX: Gastro-esophageal reflux disease without esophagitis: K21.9

## 2015-12-05 LAB — BASIC METABOLIC PANEL
Anion gap: 8 (ref 5–15)
BUN: 17 mg/dL (ref 6–20)
CO2: 22 mmol/L (ref 22–32)
Calcium: 9.8 mg/dL (ref 8.9–10.3)
Chloride: 108 mmol/L (ref 101–111)
Creatinine, Ser: 1.17 mg/dL — ABNORMAL HIGH (ref 0.44–1.00)
GFR calc Af Amer: 57 mL/min — ABNORMAL LOW (ref >60)
GFR calc non Af Amer: 49 mL/min — ABNORMAL LOW (ref >60)
Glucose, Bld: 105 mg/dL — ABNORMAL HIGH (ref 65–99)
Potassium: 3.5 mmol/L (ref 3.5–5.1)
Sodium: 138 mmol/L (ref 135–145)

## 2015-12-05 LAB — CBC
HCT: 36.2 % (ref 36.0–46.0)
Hemoglobin: 11.6 g/dL — ABNORMAL LOW (ref 12.0–15.0)
MCH: 27.3 pg (ref 26.0–34.0)
MCHC: 32 g/dL (ref 30.0–36.0)
MCV: 85.2 fL (ref 78.0–100.0)
Platelets: 325 10*3/uL (ref 150–400)
RBC: 4.25 MIL/uL (ref 3.87–5.11)
RDW: 14.6 % (ref 11.5–15.5)
WBC: 9.8 10*3/uL (ref 4.0–10.5)

## 2015-12-05 LAB — GLUCOSE, CAPILLARY: Glucose-Capillary: 138 mg/dL — ABNORMAL HIGH (ref 65–99)

## 2015-12-05 NOTE — Progress Notes (Signed)
Pt denies any acute cardiopulmonary issues. Pt is under the care of Dr. Einar Gip, Cardiology and Dr. Chalmers Cater, Endocrinology. Pt stated that her "  last A1c one month ago was 12." Records were requested from Dr. Chalmers Cater. Pt stated that her fasting blood glucose " is anywhere from 50 -400's."  Pt is currently under the care of Dr. Leta Baptist, Neurology for chronic migraines. Pt denies having a stress test. Pt denies having a chest x ray within the last year. Pt denies having any recent labs. Pt chart forwarded to anesthesia for review.

## 2015-12-08 NOTE — Progress Notes (Signed)
Anesthesia Chart Review:  Patient is a 60year old female scheduled for left shoulder diagnostic arthroscopy, subacromial decompression, biceps tenodesis, possible rotator cuff repair on 12/09/2015 by Dr. Marlou Sa.  History includes obesity, non-smoker, DM2, asthma, left thyroidectomy, hypothyroidism, fibroids, headaches, HTN, hysterectomy, lateral L3-4 fusion '14, cholecystectomy.    PCP is listed as Dr. Glendale Chard.  Endocrinologist is Dr. Chalmers Cater.  Neurologist is Dr. Leta Baptist.  Meds include albuterol, Qvar, Dexilant, Evzio (naloxone), Flector patch, Humulin N, Humalog, Opana, Percocet, Maxalt-MLT, Topamax, Crestor, levothyroxine, Zofran.   BP 117/67   Pulse 94   Temp 36.9 C   Resp 20   Ht 4\' 9"  (1.448 m)   Wt 165 lb 9.6 oz (75.1 kg)   SpO2 96%   BMI 35.84 kg/m   EKG 12/05/15: NSR.   She saw Dr. Einar Gip in 04/2011 for chest pain and had a cardiac cath on 04/27/11 that snowed normal coronaries, right dominant circulation, EF 55--60%, no wall motion abnormality. (Report found under Encounters tab.) She is no longer being actively followed by a cardiologist.   Echo 12/03/09: - Left ventricle: The cavity size was normal. Wall thickness was increased in a pattern of mild LVH. The estimated ejection fraction was 65%. Wall motion was normal; there were no regional wall motion abnormalities. Left ventricular diastolic function parameters were normal. - Aortic valve: Sclerosis without stenosis.    CXR on 11/01/12 showed no active lung disease, stable borderline cardiomegaly.  Preoperative labs noted. H/H 11.6/36.2. Cr 1.17. Glucose was 105. A1c is requested from Dr. Almetta Lovely office (re-requested at 10:34 AM). Patient told her PAT RN that home fasting CBGs run anywhere from 50-400's and that her last A1c about a month ago was "12.". I called Kim at Dr. Randel Pigg office around 11:30 this morning to report what patient had stated about her DM control while at PAT. She will review with Dr. Marlou Sa to inquire  about delaying surgery until DM is better controlled. In the interim, I just received records from Dr. Chalmers Cater. Patient was seen on 11/11/15. Her note stated that patient's fasting CBGs had been running 176-200 with postprandial 226-250; however, A1c results did come back at 12.6%, consistent with average glucose of 315. Her serum glucose then was low at 48. The records indicated that she was on Humulin N 40 units BID and Humalog 10-15 Units TID. (Per our med record, she reported taking Humalog 5-14 Units TID and Humulin N 30 Units Q AM and 40 Units Q PM.) I called and left a voice message with Maudie Mercury confirming patient's recent A1c was 12.6% and would be at high risk for cancellation if fasting CBG was > 200 on the day of surgery.   George Hugh [facility] Short Stay Center/Anesthesiology Phone [phone] 12/08/2015 12:34 PM

## 2015-12-10 DIAGNOSIS — J45998 Other asthma: Secondary | ICD-10-CM | POA: Diagnosis not present

## 2015-12-11 ENCOUNTER — Telehealth (INDEPENDENT_AMBULATORY_CARE_PROVIDER_SITE_OTHER): Payer: Self-pay | Admitting: *Deleted

## 2015-12-11 NOTE — Telephone Encounter (Signed)
IC pt and advised no, she does not need to keep this appt, and I will cancel it for her. LMVM

## 2015-12-11 NOTE — Telephone Encounter (Signed)
Pt. Called stating she did not have surgery on 10/23 and didn't know if she should keep her appt on 10/30. Pt. Requesting call back. [phone].

## 2015-12-15 ENCOUNTER — Inpatient Hospital Stay (INDEPENDENT_AMBULATORY_CARE_PROVIDER_SITE_OTHER): Payer: Medicare Other | Admitting: Orthopedic Surgery

## 2016-01-01 DIAGNOSIS — J4520 Mild intermittent asthma, uncomplicated: Secondary | ICD-10-CM | POA: Diagnosis not present

## 2016-01-01 DIAGNOSIS — I158 Other secondary hypertension: Secondary | ICD-10-CM | POA: Diagnosis not present

## 2016-01-01 DIAGNOSIS — M4326 Fusion of spine, lumbar region: Secondary | ICD-10-CM | POA: Diagnosis not present

## 2016-01-16 ENCOUNTER — Other Ambulatory Visit (INDEPENDENT_AMBULATORY_CARE_PROVIDER_SITE_OTHER): Payer: Self-pay | Admitting: Orthopedic Surgery

## 2016-01-19 ENCOUNTER — Other Ambulatory Visit (INDEPENDENT_AMBULATORY_CARE_PROVIDER_SITE_OTHER): Payer: Self-pay | Admitting: Orthopedic Surgery

## 2016-01-19 NOTE — Telephone Encounter (Signed)
Rx request 

## 2016-01-22 DIAGNOSIS — E1165 Type 2 diabetes mellitus with hyperglycemia: Secondary | ICD-10-CM | POA: Diagnosis not present

## 2016-01-26 ENCOUNTER — Other Ambulatory Visit (INDEPENDENT_AMBULATORY_CARE_PROVIDER_SITE_OTHER): Payer: Self-pay | Admitting: Orthopedic Surgery

## 2016-01-26 ENCOUNTER — Telehealth (INDEPENDENT_AMBULATORY_CARE_PROVIDER_SITE_OTHER): Payer: Self-pay | Admitting: Orthopedic Surgery

## 2016-01-26 DIAGNOSIS — M7522 Bicipital tendinitis, left shoulder: Secondary | ICD-10-CM

## 2016-01-26 NOTE — Telephone Encounter (Signed)
Put new orders in today.

## 2016-01-26 NOTE — Telephone Encounter (Signed)
Brianna Mitchell with [facility] called needing orders.  Patient was to have surgery back in October but was cancelled because of patient's uncontrolled diabetes. She states there is very little information available.  [phone]

## 2016-01-27 ENCOUNTER — Encounter (HOSPITAL_COMMUNITY): Payer: Self-pay

## 2016-01-27 ENCOUNTER — Encounter (HOSPITAL_COMMUNITY)
Admission: RE | Admit: 2016-01-27 | Discharge: 2016-01-27 | Disposition: A | Discharge status: Home / Self Care | Payer: Medicare Other | Source: Ambulatory Visit | Attending: Internal Medicine | Admitting: Internal Medicine

## 2016-01-27 DIAGNOSIS — I158 Other secondary hypertension: Secondary | ICD-10-CM | POA: Diagnosis not present

## 2016-01-27 DIAGNOSIS — Z01818 Encounter for other preprocedural examination: Secondary | ICD-10-CM | POA: Diagnosis not present

## 2016-01-27 DIAGNOSIS — M19012 Primary osteoarthritis, left shoulder: Secondary | ICD-10-CM | POA: Insufficient documentation

## 2016-01-27 DIAGNOSIS — J4520 Mild intermittent asthma, uncomplicated: Secondary | ICD-10-CM | POA: Diagnosis not present

## 2016-01-27 DIAGNOSIS — M4326 Fusion of spine, lumbar region: Secondary | ICD-10-CM | POA: Diagnosis not present

## 2016-01-27 HISTORY — DX: Cardiac murmur, unspecified: R01.1

## 2016-01-27 LAB — BASIC METABOLIC PANEL
Anion gap: 8 (ref 5–15)
BUN: 13 mg/dL (ref 6–20)
CO2: 23 mmol/L (ref 22–32)
Calcium: 9.6 mg/dL (ref 8.9–10.3)
Chloride: 110 mmol/L (ref 101–111)
Creatinine, Ser: 1.16 mg/dL — ABNORMAL HIGH (ref 0.44–1.00)
GFR calc Af Amer: 58 mL/min — ABNORMAL LOW (ref >60)
GFR calc non Af Amer: 50 mL/min — ABNORMAL LOW (ref >60)
Glucose, Bld: 34 mg/dL — CL (ref 65–99)
Potassium: 3.2 mmol/L — ABNORMAL LOW (ref 3.5–5.1)
Sodium: 141 mmol/L (ref 135–145)

## 2016-01-27 LAB — GLUCOSE, CAPILLARY: Glucose-Capillary: 76 mg/dL (ref 65–99)

## 2016-01-27 LAB — CBC
HCT: 35.7 % — ABNORMAL LOW (ref 36.0–46.0)
Hemoglobin: 11.4 g/dL — ABNORMAL LOW (ref 12.0–15.0)
MCH: 27.4 pg (ref 26.0–34.0)
MCHC: 31.9 g/dL (ref 30.0–36.0)
MCV: 85.8 fL (ref 78.0–100.0)
Platelets: 318 10*3/uL (ref 150–400)
RBC: 4.16 MIL/uL (ref 3.87–5.11)
RDW: 14.4 % (ref 11.5–15.5)
WBC: 9.7 10*3/uL (ref 4.0–10.5)

## 2016-01-27 NOTE — Progress Notes (Signed)
RECEIVED CRITICAL GLUCOSE FROM BETH SLADE IN LAB. GLUCOSE WAS 34.  CBG AT PAT VISIT WAS 76.  PATIENT STATED SHE FELT FINE.  ATTEMPTED TO CALL PATIENT X2 AFTER CRITICAL GLUCOSE WAS CALLED.  NO ANSWER.

## 2016-01-27 NOTE — Progress Notes (Signed)
CALLED AND SPOKE WITH PATIENT RE; LOW BLOOD GLUCOSE 34, SHE STATED SHE TOOK GLUCOSE GEL AND BLOOD SUGAR CAME UP. PATIENT STATES SHE FEELS FINE.

## 2016-01-27 NOTE — Progress Notes (Signed)
Patient states she only sees Dr. Einar Gip on as needed basis.    PCP -  Dr. Bryon Lions  [facility]

## 2016-01-28 LAB — HEMOGLOBIN A1C
Hgb A1c MFr Bld: 8.8 % — ABNORMAL HIGH (ref 4.8–5.6)
Mean Plasma Glucose: 206 mg/dL

## 2016-01-28 NOTE — Progress Notes (Signed)
Anesthesia Chart Review:  Pt is a 60 year old female scheduled for L shoulder arthroscopy, subacromial decompression, debridement, biceps tenodesis, possible rotator cuff repair, distal clavicle resection on 02/03/2016 with Meredith Pel, MD  Surgery was originally scheduled for 12/09/2015 but was cancelled due to uncontrolled DM.   - PCP is Glendale Chard, MD.   PMH includes:  DM, asthma, anemia, left thyroidectomy, hypothyroidism, HTN. Never smoker. BMI 37. S/p XLIF 01/03/13.   Medications include: albuterol, qvar, dexilant, humalog, humulin, levothyroxine, crestor  Preoperative labs reviewed.  HgbA1c 8.8 (down from 12.6 11/11/15). Glucose 34.  PAT RN called pt about hypoglycemia; at home pt took glucose gel, glucose then 76, she reports feeling fine.   EKG 12/05/15: NSR.   She saw Dr. Einar Gip in 04/2011 for chest pain and had a cardiac cath on 04/27/11 that snowed normal coronaries, right dominant circulation, EF 55--60%, no wall motion abnormality. (Report found under Encounters tab.) She is no longer being actively followed by a cardiologist.   Echo 12/03/09: - Left ventricle: The cavity size was normal. Wall thickness was increased in a pattern of mild LVH. The estimated ejection fraction was 65%. Wall motion was normal; there were no regional wall motion abnormalities. Left ventricular diastolic function parameters were normal. - Aortic valve: Sclerosis without stenosis.   If no changes, I anticipate pt can proceed with surgery as scheduled.   Willeen Cass, FNP-BC [facility]/Anesthesiology Phone: [phone] 01/28/2016 3:17 PM

## 2016-02-02 NOTE — H&P (Signed)
Brianna Mitchell is an 60 y.o. female.   Chief Complaint: left shoulder pain [id number] is a 60 year old female with left shoulder pain.  This is been going on for several years.  MRI scan from September suggested possible rotator cuff tear which was small versus postsurgical changes.  There is also tendinosis of the biceps tendon and some before meals joint arthropathy.  Examination of the shoulder and clinic demonstrated before meals joint tenderness along with pain.  She had a good result on the right shoulder with prior surgery which did include distal clavicle excision.  She has diabetes and that has been under improving control but it is not ideal.  Past Medical History:  Diagnosis Date  . Anemia   . Asthma   . Back pain   . Carpal tunnel syndrome   . Diabetes mellitus   . Fibroid tumor   . Ganglion cyst   . GERD (gastroesophageal reflux disease)   . Headache(784.0)   . Heart murmur    saw dr. Einar Gip (only sees if needed, not seen since 2011)  . Hypertension    dr Einar Gip     . Hypothyroidism   . Shortness of breath   . Shoulder pain   . Tendonitis    left shoulder biceps tendonitis, acromioclavicular osteoarthritis  . Tennis elbow   . Thyroid disease     Past Surgical History:  Procedure Laterality Date  . ABDOMINAL HYSTERECTOMY    . ANTERIOR LAT LUMBAR FUSION N/A 01/03/2013   Procedure: X LIF (Extreme Lateral Interbody Fusion) L3-L4,  (1 LEVEL) ;  Surgeon: Melina Schools, MD;  Location: [facility];  Service: Orthopedics;  Laterality: N/A;  . BACK SURGERY     x5  . CHOLECYSTECTOMY    . KNEE ARTHROSCOPY Right 04/2014  . LEFT HEART CATHETERIZATION WITH CORONARY ANGIOGRAM N/A 04/27/2011   Procedure: LEFT HEART CATHETERIZATION WITH CORONARY ANGIOGRAM;  Surgeon: Laverda Page, MD;  Location: [facility] C[facility] LAB;  Service: Cardiovascular;  Laterality: N/A;  . pelvic bone removed    . ROTATOR CUFF REPAIR    . THYROID SURGERY     LUMP REMOVED  . TUBAL LIGATION      Family History  Problem  Relation Age of Onset  . Heart disease Mother   . Heart disease Father   . Breast cancer Sister   . Heart disease Sister   . Heart disease Brother    Social History:  reports that she has never smoked. She has never used smokeless tobacco. She reports that she does not drink alcohol or use drugs.  Allergies:  Allergies  Allergen Reactions  . Aspirin Swelling  . Sulfa Antibiotics Swelling  . Demerol Rash    No prescriptions prior to admission.    No results found for this or any previous visit (from the past 48 hour(s)). No results found.  ROSall systems reviewed are negative as they relate to the history of present illness.  There have been no fevers or chills.  She does report left shoulder pain.  Her right shoulder is doing well from prior surgery  There were no vitals taken for this visit. Physical Exam  Patient is well-developed well-nourished in no acute distress Extraocular movements intact Neck range of motion normal Respiratory effort normal Heart rate normal Skin normal Patient is alert Mood and affect normal Examination the left shoulder demonstrates no restriction of external rotation at 15 of abduction.  Before meals joint tenderness is present to palpation.  Impingement signs are positive.  Rotator cuff strength is generally symmetric to infraspinatus supraspinatus and subscap testing.  Negative O'Brien's testing on the left but positive speed's testing on the left.  No other masses lymph adenopathy or skin changes noted in the shoulder girdle region Assessment/Plan Impression is left shoulder pain with failure of nonoperative management including subacromial and distal clavicle acromio clavicular joint injections.  These injections have helped temporarily but her symptoms persist.  She is failed a course of rehabilitation.  She presents now for operative arthroscopy with inspection of the rotator cuff likely tenodesis of the biceps tendon which the MRI scan shows  have significant tendinosis and likely arthroscopic distal clavicle excision.  Risks and benefits discussed including but not limited to infection nerve and vessel damage shoulder stiffness and incomplete pain relief.  She has had a very positive result from her right shoulder surgery and desires the same for the left  Anderson Malta, MD 02/02/2016, 9:25 PM

## 2016-02-03 ENCOUNTER — Ambulatory Visit (HOSPITAL_COMMUNITY): Payer: Medicare Other | Admitting: Anesthesiology

## 2016-02-03 ENCOUNTER — Observation Stay (HOSPITAL_COMMUNITY)
Admission: RE | Admit: 2016-02-03 | Discharge: 2016-02-05 | Disposition: A | Discharge status: Home / Self Care | Payer: Medicare Other | Source: Ambulatory Visit | Attending: Orthopedic Surgery | Admitting: Orthopedic Surgery

## 2016-02-03 ENCOUNTER — Encounter (HOSPITAL_COMMUNITY): Payer: Self-pay | Admitting: *Deleted

## 2016-02-03 ENCOUNTER — Ambulatory Visit (HOSPITAL_COMMUNITY): Payer: Medicare Other | Admitting: Vascular Surgery

## 2016-02-03 ENCOUNTER — Encounter (HOSPITAL_COMMUNITY)
Admission: RE | Disposition: A | Discharge status: Home / Self Care | Payer: Self-pay | Source: Ambulatory Visit | Attending: Orthopedic Surgery

## 2016-02-03 DIAGNOSIS — E1165 Type 2 diabetes mellitus with hyperglycemia: Secondary | ICD-10-CM | POA: Diagnosis not present

## 2016-02-03 DIAGNOSIS — Z886 Allergy status to analgesic agent status: Secondary | ICD-10-CM | POA: Diagnosis not present

## 2016-02-03 DIAGNOSIS — Z6837 Body mass index (BMI) 37.0-37.9, adult: Secondary | ICD-10-CM | POA: Diagnosis not present

## 2016-02-03 DIAGNOSIS — M7522 Bicipital tendinitis, left shoulder: Secondary | ICD-10-CM | POA: Insufficient documentation

## 2016-02-03 DIAGNOSIS — I10 Essential (primary) hypertension: Secondary | ICD-10-CM | POA: Insufficient documentation

## 2016-02-03 DIAGNOSIS — Z794 Long term (current) use of insulin: Secondary | ICD-10-CM | POA: Insufficient documentation

## 2016-02-03 DIAGNOSIS — K219 Gastro-esophageal reflux disease without esophagitis: Secondary | ICD-10-CM | POA: Insufficient documentation

## 2016-02-03 DIAGNOSIS — M7552 Bursitis of left shoulder: Secondary | ICD-10-CM | POA: Insufficient documentation

## 2016-02-03 DIAGNOSIS — G8918 Other acute postprocedural pain: Secondary | ICD-10-CM | POA: Diagnosis not present

## 2016-02-03 DIAGNOSIS — Z882 Allergy status to sulfonamides status: Secondary | ICD-10-CM | POA: Insufficient documentation

## 2016-02-03 DIAGNOSIS — M19012 Primary osteoarthritis, left shoulder: Secondary | ICD-10-CM | POA: Diagnosis not present

## 2016-02-03 DIAGNOSIS — D649 Anemia, unspecified: Secondary | ICD-10-CM | POA: Diagnosis not present

## 2016-02-03 DIAGNOSIS — E669 Obesity, unspecified: Secondary | ICD-10-CM | POA: Insufficient documentation

## 2016-02-03 DIAGNOSIS — R011 Cardiac murmur, unspecified: Secondary | ICD-10-CM | POA: Insufficient documentation

## 2016-02-03 DIAGNOSIS — Z23 Encounter for immunization: Secondary | ICD-10-CM | POA: Insufficient documentation

## 2016-02-03 DIAGNOSIS — M19019 Primary osteoarthritis, unspecified shoulder: Secondary | ICD-10-CM | POA: Diagnosis present

## 2016-02-03 DIAGNOSIS — Z885 Allergy status to narcotic agent status: Secondary | ICD-10-CM | POA: Insufficient documentation

## 2016-02-03 DIAGNOSIS — Z90710 Acquired absence of both cervix and uterus: Secondary | ICD-10-CM | POA: Insufficient documentation

## 2016-02-03 DIAGNOSIS — E039 Hypothyroidism, unspecified: Secondary | ICD-10-CM | POA: Insufficient documentation

## 2016-02-03 DIAGNOSIS — J45909 Unspecified asthma, uncomplicated: Secondary | ICD-10-CM | POA: Diagnosis not present

## 2016-02-03 DIAGNOSIS — Z981 Arthrodesis status: Secondary | ICD-10-CM | POA: Diagnosis not present

## 2016-02-03 HISTORY — PX: SHOULDER ARTHROSCOPY WITH SUBACROMIAL DECOMPRESSION, ROTATOR CUFF REPAIR AND BICEP TENDON REPAIR: SHX5687

## 2016-02-03 LAB — GLUCOSE, CAPILLARY
Glucose-Capillary: 180 mg/dL — ABNORMAL HIGH (ref 65–99)
Glucose-Capillary: 143 mg/dL — ABNORMAL HIGH (ref 65–99)
Glucose-Capillary: 134 mg/dL — ABNORMAL HIGH (ref 65–99)
Glucose-Capillary: 150 mg/dL — ABNORMAL HIGH (ref 65–99)
Glucose-Capillary: 171 mg/dL — ABNORMAL HIGH (ref 65–99)

## 2016-02-03 SURGERY — SHOULDER ARTHROSCOPY WITH SUBACROMIAL DECOMPRESSION, ROTATOR CUFF REPAIR AND BICEP TENDON REPAIR
Anesthesia: Regional | Site: Shoulder | Laterality: Left

## 2016-02-03 MED ORDER — LACTATED RINGERS IV SOLN
INTRAVENOUS | Status: DC
Start: 2016-02-03 — End: 2016-02-03

## 2016-02-03 MED ORDER — LEVOTHYROXINE SODIUM 75 MCG PO TABS
75.0000 ug | ORAL_TABLET | Freq: Every day | ORAL | Status: DC
  Administered 2016-02-04 – 2016-02-05 (×2): 75 ug via ORAL
  Filled 2016-02-03 (×2): qty 1

## 2016-02-03 MED ORDER — FENTANYL CITRATE (PF) 100 MCG/2ML IJ SOLN
INTRAMUSCULAR | Status: AC
  Filled 2016-02-03: qty 2

## 2016-02-03 MED ORDER — PHENYLEPHRINE HCL 10 MG/ML IJ SOLN
INTRAMUSCULAR | Status: DC | PRN
  Administered 2016-02-03 (×3): 80 ug via INTRAVENOUS

## 2016-02-03 MED ORDER — SUMATRIPTAN SUCCINATE 100 MG PO TABS
100.0000 mg | ORAL_TABLET | ORAL | Status: DC | PRN
  Administered 2016-02-03: 100 mg via ORAL
  Filled 2016-02-03 (×3): qty 1

## 2016-02-03 MED ORDER — PANTOPRAZOLE SODIUM 40 MG PO TBEC
40.0000 mg | DELAYED_RELEASE_TABLET | Freq: Every day | ORAL | Status: DC
  Administered 2016-02-04 – 2016-02-05 (×2): 40 mg via ORAL
  Filled 2016-02-03 (×2): qty 1

## 2016-02-03 MED ORDER — CHLORHEXIDINE GLUCONATE 4 % EX LIQD: 60.0000 mL | Freq: Once | CUTANEOUS | Status: DC

## 2016-02-03 MED ORDER — CEFAZOLIN SODIUM-DEXTROSE 2-4 GM/100ML-% IV SOLN
2.0000 g | INTRAVENOUS | Status: AC
  Administered 2016-02-03: 2 g via INTRAVENOUS

## 2016-02-03 MED ORDER — ALBUTEROL SULFATE (2.5 MG/3ML) 0.083% IN NEBU: 2.5000 mg | INHALATION_SOLUTION | Freq: Four times a day (QID) | RESPIRATORY_TRACT | Status: DC | PRN

## 2016-02-03 MED ORDER — SODIUM CHLORIDE 0.9 % IR SOLN
Status: DC | PRN
  Administered 2016-02-03: 3000 mL

## 2016-02-03 MED ORDER — METHOCARBAMOL 1000 MG/10ML IJ SOLN
500.0000 mg | Freq: Four times a day (QID) | INTRAVENOUS | Status: DC | PRN
  Filled 2016-02-03: qty 5

## 2016-02-03 MED ORDER — FENTANYL CITRATE (PF) 100 MCG/2ML IJ SOLN
INTRAMUSCULAR | Status: AC
  Administered 2016-02-03: 100 ug
  Filled 2016-02-03: qty 2

## 2016-02-03 MED ORDER — TOPIRAMATE 100 MG PO TABS
100.0000 mg | ORAL_TABLET | Freq: Two times a day (BID) | ORAL | Status: DC
  Administered 2016-02-03 – 2016-02-05 (×4): 100 mg via ORAL
  Filled 2016-02-03 (×4): qty 1

## 2016-02-03 MED ORDER — MIDAZOLAM HCL 2 MG/2ML IJ SOLN
INTRAMUSCULAR | Status: AC
  Filled 2016-02-03: qty 2

## 2016-02-03 MED ORDER — PHENYLEPHRINE HCL 10 MG/ML IJ SOLN
INTRAVENOUS | Status: DC | PRN
  Administered 2016-02-03: 50 ug/min via INTRAVENOUS

## 2016-02-03 MED ORDER — INFLUENZA VAC SPLIT QUAD 0.5 ML IM SUSY
0.5000 mL | PREFILLED_SYRINGE | INTRAMUSCULAR | Status: AC
  Administered 2016-02-04: 0.5 mL via INTRAMUSCULAR
  Filled 2016-02-03: qty 0.5

## 2016-02-03 MED ORDER — MEPERIDINE HCL 25 MG/ML IJ SOLN: 6.2500 mg | INTRAMUSCULAR | Status: DC | PRN

## 2016-02-03 MED ORDER — METHOCARBAMOL 500 MG PO TABS
500.0000 mg | ORAL_TABLET | Freq: Four times a day (QID) | ORAL | Status: DC | PRN
  Administered 2016-02-03: 500 mg via ORAL
  Filled 2016-02-03: qty 1

## 2016-02-03 MED ORDER — CEFAZOLIN SODIUM-DEXTROSE 2-4 GM/100ML-% IV SOLN
2.0000 g | Freq: Four times a day (QID) | INTRAVENOUS | Status: AC
  Administered 2016-02-03 – 2016-02-04 (×3): 2 g via INTRAVENOUS
  Filled 2016-02-03 (×3): qty 100

## 2016-02-03 MED ORDER — SUGAMMADEX SODIUM 200 MG/2ML IV SOLN
INTRAVENOUS | Status: DC | PRN
  Administered 2016-02-03: 50 mg via INTRAVENOUS
  Administered 2016-02-03: 350 mg via INTRAVENOUS

## 2016-02-03 MED ORDER — BUPIVACAINE HCL (PF) 0.25 % IJ SOLN
INTRAMUSCULAR | Status: AC
  Filled 2016-02-03: qty 30

## 2016-02-03 MED ORDER — CEFAZOLIN SODIUM-DEXTROSE 2-4 GM/100ML-% IV SOLN
INTRAVENOUS | Status: AC
  Filled 2016-02-03: qty 100

## 2016-02-03 MED ORDER — PROPOFOL 10 MG/ML IV BOLUS
INTRAVENOUS | Status: AC
  Filled 2016-02-03: qty 20

## 2016-02-03 MED ORDER — BUDESONIDE 0.25 MG/2ML IN SUSP
0.2500 mg | Freq: Two times a day (BID) | RESPIRATORY_TRACT | Status: DC
  Administered 2016-02-04 (×2): 0.25 mg via RESPIRATORY_TRACT
  Filled 2016-02-03 (×3): qty 2

## 2016-02-03 MED ORDER — EPHEDRINE 5 MG/ML INJ
INTRAVENOUS | Status: AC
  Filled 2016-02-03: qty 10

## 2016-02-03 MED ORDER — METOCLOPRAMIDE HCL 5 MG/ML IJ SOLN: 5.0000 mg | Freq: Three times a day (TID) | INTRAMUSCULAR | Status: DC | PRN

## 2016-02-03 MED ORDER — HYDROMORPHONE HCL 1 MG/ML IJ SOLN: 0.2500 mg | INTRAMUSCULAR | Status: DC | PRN

## 2016-02-03 MED ORDER — PHENYLEPHRINE 40 MCG/ML (10ML) SYRINGE FOR IV PUSH (FOR BLOOD PRESSURE SUPPORT)
PREFILLED_SYRINGE | INTRAVENOUS | Status: AC
Start: 2016-02-03 — End: 2016-02-03
  Filled 2016-02-03: qty 10

## 2016-02-03 MED ORDER — PROMETHAZINE HCL 25 MG/ML IJ SOLN: 6.2500 mg | INTRAMUSCULAR | Status: DC | PRN

## 2016-02-03 MED ORDER — ACETAMINOPHEN 325 MG PO TABS
650.0000 mg | ORAL_TABLET | Freq: Four times a day (QID) | ORAL | Status: DC | PRN
Start: 2016-02-03 — End: 2016-02-05
  Administered 2016-02-04 – 2016-02-05 (×2): 650 mg via ORAL
  Filled 2016-02-03 (×2): qty 2

## 2016-02-03 MED ORDER — ACETAMINOPHEN 650 MG RE SUPP: 650.0000 mg | Freq: Four times a day (QID) | RECTAL | Status: DC | PRN

## 2016-02-03 MED ORDER — POLYETHYLENE GLYCOL 3350 17 G PO PACK
17.0000 g | PACK | Freq: Every day | ORAL | Status: DC
  Administered 2016-02-04 – 2016-02-05 (×2): 17 g via ORAL
  Filled 2016-02-03 (×2): qty 1

## 2016-02-03 MED ORDER — ONDANSETRON HCL 4 MG/2ML IJ SOLN
INTRAMUSCULAR | Status: DC | PRN
  Administered 2016-02-03: 4 mg via INTRAVENOUS

## 2016-02-03 MED ORDER — INSULIN ISOPHANE HUMAN 100 UNIT/ML KWIKPEN: 30.0000 [IU] | PEN_INJECTOR | Freq: Two times a day (BID) | SUBCUTANEOUS | Status: DC

## 2016-02-03 MED ORDER — LIDOCAINE HCL (CARDIAC) 20 MG/ML IV SOLN
INTRAVENOUS | Status: DC | PRN
  Administered 2016-02-03: 100 mg via INTRAVENOUS

## 2016-02-03 MED ORDER — 0.9 % SODIUM CHLORIDE (POUR BTL) OPTIME
TOPICAL | Status: DC | PRN
  Administered 2016-02-03: 1000 mL

## 2016-02-03 MED ORDER — METOCLOPRAMIDE HCL 5 MG PO TABS: 5.0000 mg | ORAL_TABLET | Freq: Three times a day (TID) | ORAL | Status: DC | PRN

## 2016-02-03 MED ORDER — OXYCODONE HCL 5 MG PO TABS
5.0000 mg | ORAL_TABLET | ORAL | Status: DC | PRN
  Administered 2016-02-03 – 2016-02-05 (×3): 10 mg via ORAL
  Filled 2016-02-03 (×4): qty 2

## 2016-02-03 MED ORDER — LACTATED RINGERS IV SOLN
INTRAVENOUS | Status: DC | PRN
  Administered 2016-02-03 (×2): via INTRAVENOUS

## 2016-02-03 MED ORDER — PHENOL 1.4 % MT LIQD: 1.0000 | OROMUCOSAL | Status: DC | PRN

## 2016-02-03 MED ORDER — EPINEPHRINE PF 1 MG/ML IJ SOLN
INTRAMUSCULAR | Status: DC | PRN
  Administered 2016-02-03: .1 mL via INTRAMUSCULAR

## 2016-02-03 MED ORDER — SUCCINYLCHOLINE CHLORIDE 20 MG/ML IJ SOLN
INTRAMUSCULAR | Status: DC | PRN
  Administered 2016-02-03: 100 mg via INTRAVENOUS

## 2016-02-03 MED ORDER — MENTHOL 3 MG MT LOZG: 1.0000 | LOZENGE | OROMUCOSAL | Status: DC | PRN

## 2016-02-03 MED ORDER — ALBUTEROL SULFATE HFA 108 (90 BASE) MCG/ACT IN AERS: 2.0000 | INHALATION_SPRAY | Freq: Four times a day (QID) | RESPIRATORY_TRACT | Status: DC | PRN

## 2016-02-03 MED ORDER — SODIUM CHLORIDE 0.9 % IJ SOLN
INTRAMUSCULAR | Status: DC | PRN
  Administered 2016-02-03: 50 mL

## 2016-02-03 MED ORDER — OXYMORPHONE HCL ER 10 MG PO T12A
40.0000 mg | EXTENDED_RELEASE_TABLET | Freq: Three times a day (TID) | ORAL | Status: DC
  Administered 2016-02-03 – 2016-02-04 (×2): 40 mg via ORAL
  Filled 2016-02-03 (×2): qty 4

## 2016-02-03 MED ORDER — INSULIN NPH (HUMAN) (ISOPHANE) 100 UNIT/ML ~~LOC~~ SUSP
30.0000 [IU] | Freq: Every day | SUBCUTANEOUS | Status: DC
  Administered 2016-02-04: 30 [IU] via SUBCUTANEOUS
  Filled 2016-02-03: qty 10

## 2016-02-03 MED ORDER — ONDANSETRON HCL 4 MG/2ML IJ SOLN: 4.0000 mg | Freq: Four times a day (QID) | INTRAMUSCULAR | Status: DC | PRN

## 2016-02-03 MED ORDER — ONDANSETRON 4 MG PO TBDP: 4.0000 mg | ORAL_TABLET | Freq: Three times a day (TID) | ORAL | Status: DC | PRN

## 2016-02-03 MED ORDER — INSULIN LISPRO 100 UNIT/ML (KWIKPEN)
5.0000 [IU] | PEN_INJECTOR | Freq: Three times a day (TID) | SUBCUTANEOUS | Status: DC
  Filled 2016-02-03: qty 3

## 2016-02-03 MED ORDER — PROPOFOL 10 MG/ML IV BOLUS
INTRAVENOUS | Status: DC | PRN
  Administered 2016-02-03: 160 mg via INTRAVENOUS

## 2016-02-03 MED ORDER — EPINEPHRINE PF 1 MG/ML IJ SOLN
INTRAMUSCULAR | Status: AC
Start: 2016-02-03 — End: 2016-02-03
  Filled 2016-02-03: qty 1

## 2016-02-03 MED ORDER — EPHEDRINE SULFATE 50 MG/ML IJ SOLN
INTRAMUSCULAR | Status: DC | PRN
  Administered 2016-02-03: 5 mg via INTRAVENOUS
  Administered 2016-02-03: 10 mg via INTRAVENOUS

## 2016-02-03 MED ORDER — ROCURONIUM BROMIDE 100 MG/10ML IV SOLN
INTRAVENOUS | Status: DC | PRN
  Administered 2016-02-03: 50 mg via INTRAVENOUS

## 2016-02-03 MED ORDER — INSULIN ASPART 100 UNIT/ML ~~LOC~~ SOLN
5.0000 [IU] | Freq: Three times a day (TID) | SUBCUTANEOUS | Status: DC
  Administered 2016-02-04: 6 [IU] via SUBCUTANEOUS
  Administered 2016-02-04: 5 [IU] via SUBCUTANEOUS
  Administered 2016-02-04: 6 [IU] via SUBCUTANEOUS

## 2016-02-03 MED ORDER — ROSUVASTATIN CALCIUM 10 MG PO TABS
40.0000 mg | ORAL_TABLET | Freq: Every day | ORAL | Status: DC
  Administered 2016-02-03 – 2016-02-04 (×2): 40 mg via ORAL
  Filled 2016-02-03 (×2): qty 4

## 2016-02-03 MED ORDER — INSULIN NPH (HUMAN) (ISOPHANE) 100 UNIT/ML ~~LOC~~ SUSP
45.0000 [IU] | Freq: Every day | SUBCUTANEOUS | Status: DC
  Administered 2016-02-03 – 2016-02-04 (×2): 45 [IU] via SUBCUTANEOUS

## 2016-02-03 MED ORDER — ONDANSETRON HCL 4 MG PO TABS: 4.0000 mg | ORAL_TABLET | Freq: Four times a day (QID) | ORAL | Status: DC | PRN

## 2016-02-03 SURGICAL SUPPLY — 60 items
ANCHOR SUT SWIVELLOK BIO (Anchor) ×2 IMPLANT
BENZOIN TINCTURE PRP APPL 2/3 (GAUZE/BANDAGES/DRESSINGS) ×2 IMPLANT
BLADE CUTTER GATOR 3.5 (BLADE) ×2 IMPLANT
BLADE GREAT WHITE 4.2 (BLADE) ×2 IMPLANT
BLADE SURG 11 STRL SS (BLADE) ×2 IMPLANT
BUR OVAL 6.0 (BURR) ×2 IMPLANT
COVER SURGICAL LIGHT HANDLE (MISCELLANEOUS) ×2 IMPLANT
DRAPE INCISE IOBAN 66X45 STRL (DRAPES) ×4 IMPLANT
DRAPE STERI 35X30 U-POUCH (DRAPES) ×2 IMPLANT
DRAPE U-SHAPE 47X51 STRL (DRAPES) ×4 IMPLANT
DRSG PAD ABDOMINAL 8X10 ST (GAUZE/BANDAGES/DRESSINGS) ×6 IMPLANT
DRSG TEGADERM 4X4.75 (GAUZE/BANDAGES/DRESSINGS) ×2 IMPLANT
DURAPREP 26ML APPLICATOR (WOUND CARE) ×2 IMPLANT
ELECT REM PT RETURN 9FT ADLT (ELECTROSURGICAL) ×2
ELECTRODE REM PT RTRN 9FT ADLT (ELECTROSURGICAL) ×1 IMPLANT
FILTER STRAW FLUID ASPIR (MISCELLANEOUS) ×2 IMPLANT
GAUZE SPONGE 4X4 12PLY STRL (GAUZE/BANDAGES/DRESSINGS) ×2 IMPLANT
GAUZE XEROFORM 1X8 LF (GAUZE/BANDAGES/DRESSINGS) ×2 IMPLANT
GLOVE BIOGEL PI IND STRL 8 (GLOVE) ×1 IMPLANT
GLOVE BIOGEL PI INDICATOR 8 (GLOVE) ×1
GLOVE SURG ORTHO 8.0 STRL STRW (GLOVE) ×2 IMPLANT
GOWN STRL REUS W/ TWL LRG LVL3 (GOWN DISPOSABLE) ×3 IMPLANT
GOWN STRL REUS W/TWL LRG LVL3 (GOWN DISPOSABLE) ×3
KIT BASIN OR (CUSTOM PROCEDURE TRAY) ×2 IMPLANT
KIT ROOM TURNOVER OR (KITS) ×2 IMPLANT
MANIFOLD NEPTUNE II (INSTRUMENTS) ×2 IMPLANT
NDL SUT 6 .5 CRC .975X.05 MAYO (NEEDLE) ×1 IMPLANT
NEEDLE HYPO 25X1 1.5 SAFETY (NEEDLE) ×2 IMPLANT
NEEDLE MAYO TAPER (NEEDLE) ×1
NEEDLE SCORPION MULTI FIRE (NEEDLE) ×2 IMPLANT
NEEDLE SPNL 18GX3.5 QUINCKE PK (NEEDLE) ×2 IMPLANT
NS IRRIG 1000ML POUR BTL (IV SOLUTION) ×2 IMPLANT
PACK SHOULDER (CUSTOM PROCEDURE TRAY) ×2 IMPLANT
PAD ARMBOARD 7.5X6 YLW CONV (MISCELLANEOUS) ×4 IMPLANT
RESTRAINT HEAD UNIVERSAL NS (MISCELLANEOUS) ×2 IMPLANT
SET ARTHROSCOPY TUBING (MISCELLANEOUS) ×1
SET ARTHROSCOPY TUBING LN (MISCELLANEOUS) ×1 IMPLANT
SLING ARM IMMOBILIZER LRG (SOFTGOODS) IMPLANT
SPONGE LAP 4X18 X RAY DECT (DISPOSABLE) ×4 IMPLANT
STRIP CLOSURE SKIN 1/2X4 (GAUZE/BANDAGES/DRESSINGS) ×2 IMPLANT
SUCTION FRAZIER HANDLE 10FR (MISCELLANEOUS) ×1
SUCTION TUBE FRAZIER 10FR DISP (MISCELLANEOUS) ×1 IMPLANT
SUT ETHILON 3 0 PS 1 (SUTURE) ×2 IMPLANT
SUT FIBERWIRE #2 38 T-5 BLUE (SUTURE)
SUT PROLENE 3 0 PS 2 (SUTURE) ×2 IMPLANT
SUT VIC AB 0 CT1 27 (SUTURE) ×2
SUT VIC AB 0 CT1 27XBRD ANBCTR (SUTURE) ×2 IMPLANT
SUT VIC AB 1 CT1 27 (SUTURE) ×1
SUT VIC AB 1 CT1 27XBRD ANBCTR (SUTURE) ×1 IMPLANT
SUT VIC AB 2-0 CT1 27 (SUTURE) ×1
SUT VIC AB 2-0 CT1 TAPERPNT 27 (SUTURE) ×1 IMPLANT
SUTURE FIBERWR #2 38 T-5 BLUE (SUTURE) IMPLANT
SYR 20CC LL (SYRINGE) ×4 IMPLANT
SYR 3ML LL SCALE MARK (SYRINGE) ×2 IMPLANT
SYR TB 1ML LUER SLIP (SYRINGE) ×2 IMPLANT
TOWEL OR 17X24 6PK STRL BLUE (TOWEL DISPOSABLE) ×2 IMPLANT
TOWEL OR 17X26 10 PK STRL BLUE (TOWEL DISPOSABLE) ×2 IMPLANT
WAND HAND CNTRL MULTIVAC 90 (MISCELLANEOUS) IMPLANT
WAND STAR VAC 90 (SURGICAL WAND) ×2 IMPLANT
WATER STERILE IRR 1000ML POUR (IV SOLUTION) ×2 IMPLANT

## 2016-02-03 NOTE — Anesthesia Procedure Notes (Addendum)
Anesthesia Regional Block:  Interscalene brachial plexus block  Pre-Anesthetic Checklist: ,, timeout performed, Correct Patient, Correct Site, Correct Laterality, Correct Procedure, Correct Position, site marked, Risks and benefits discussed,  Surgical consent,  Pre-op evaluation,  At surgeon's request and post-op pain management  Laterality: Left  Prep: chloraprep       Needles:  Injection technique: Single-shot  Needle Type: Stimulator Needle - 40     Needle Length: 4cm 4 cm Needle Gauge: 22 and 22 G    Additional Needles:  Procedures: ultrasound guided (picture in chart) Interscalene brachial plexus block Narrative:  Start time: 02/03/2016 3:05 PM End time: 02/03/2016 3:10 PM Injection made incrementally with aspirations every 5 mL.  Performed by: Personally  Anesthesiologist: Nolon Nations  Additional Notes: BP cuff, EKG monitors applied. Sedation begun. Nerve location verified with U/S. Anesthetic injected incrementally, slowly , and after neg aspirations under direct u/s guidance. Good perineural spread. Tolerated well.

## 2016-02-03 NOTE — Progress Notes (Signed)
Patient give an additional phone for Lelon Frohlich of [phone] of who will stay with her

## 2016-02-03 NOTE — Anesthesia Preprocedure Evaluation (Addendum)
Anesthesia Evaluation  Patient identified by MRN, date of birth, ID band Patient awake    Reviewed: Allergy & Precautions, H&P , NPO status , Patient's Chart, lab work & pertinent test results  Airway Mallampati: II   Neck ROM: full    Dental   Pulmonary shortness of breath, asthma ,     + decreased breath sounds  (-) rales    Cardiovascular hypertension, + Valvular Problems/Murmurs  Rhythm:Regular Rate:Normal + Systolic murmurs    Neuro/Psych  Headaches,  Neuromuscular disease    GI/Hepatic GERD  ,  Endo/Other  diabetes, Poorly Controlled, Type 2Hypothyroidism obese  Renal/GU      Musculoskeletal  (+) Arthritis ,   Abdominal (+) + obese,   Peds  Hematology  (+) anemia ,   Anesthesia Other Findings   Reproductive/Obstetrics                            Anesthesia Physical  Anesthesia Plan  ASA: III  Anesthesia Plan: General and Regional   Post-op Pain Management:    Induction: Intravenous  Airway Management Planned: Oral ETT  Additional Equipment:   Intra-op Plan:   Post-operative Plan: Extubation in OR  Informed Consent: I have reviewed the patients History and Physical, chart, labs and discussed the procedure including the risks, benefits and alternatives for the proposed anesthesia with the patient or authorized representative who has indicated his/her understanding and acceptance.   Dental advisory given  Plan Discussed with: CRNA  Anesthesia Plan Comments:         Anesthesia Quick Evaluation

## 2016-02-03 NOTE — Anesthesia Postprocedure Evaluation (Signed)
Anesthesia Post Note  Patient: Brianna Mitchell  Procedure(s) Performed: Procedure(s) (LRB): SHOULDER ARTHROSCOPY WITH SUBACROMIAL DECOMPRESSION, DEBRIDEMENT, BICEPS TENODESIS, DISTAL CLAVICLE EXCISION. (Left)  Patient location during evaluation: PACU Anesthesia Type: General and Regional Level of consciousness: awake and alert Pain management: pain level controlled Vital Signs Assessment: post-procedure vital signs reviewed and stable Respiratory status: spontaneous breathing, nonlabored ventilation and respiratory function stable Cardiovascular status: blood pressure returned to baseline and stable Postop Assessment: no signs of nausea or vomiting Anesthetic complications: no       Last Vitals:  Vitals:   02/03/16 2015 02/03/16 2025  BP: 112/73 126/69  Pulse: 75 73  Resp: 17 19  Temp:      Last Pain:  Vitals:   02/03/16 1116  TempSrc: Oral                 Adalberto Metzgar,W. EDMOND

## 2016-02-03 NOTE — Interval H&P Note (Signed)
History and Physical Interval Note:  02/03/2016 10:55 AM  Brianna Mitchell  has presented today for surgery, with the diagnosis of Left shoulder Biceps Tendonitis, Possible Rotator Cuff Tear, Acromioclavicular Osteoarthritis  The various methods of treatment have been discussed with the patient and family. After consideration of risks, benefits and other options for treatment, the patient has consented to  Procedure(s): SHOULDER ARTHROSCOPY WITH SUBACROMIAL DECOMPRESSION, DEBRIDEMENT, BICEPS TENODESIS, POSSIBLE ROTATOR CUFF TEAR REPAIR, DISTAL CLAVICLE EXCISION. (Left) as a surgical intervention .  The patient's history has been reviewed, patient examined, no change in status, stable for surgery.  I have reviewed the patient's chart and labs.  Questions were answered to the patient's satisfaction.     Anderson Malta

## 2016-02-03 NOTE — Transfer of Care (Signed)
Immediate Anesthesia Transfer of Care Note  Patient: Brianna Mitchell  Procedure(s) Performed: Procedure(s): SHOULDER ARTHROSCOPY WITH SUBACROMIAL DECOMPRESSION, DEBRIDEMENT, BICEPS TENODESIS, ROTATOR CUFF TEAR REPAIR, DISTAL CLAVICLE EXCISION. (Left)  Patient Location: PACU  Anesthesia Type:General  Level of Consciousness: awake  Airway & Oxygen Therapy: Patient Spontanous Breathing  Post-op Assessment: Report given to RN and Post -op Vital signs reviewed and stable  Post vital signs: Reviewed and stable  Last Vitals:  Vitals:   02/03/16 1116  BP: 106/62  Pulse: 90  Resp: 20  Temp: 36.8 C    Last Pain:  Vitals:   02/03/16 1116  TempSrc: Oral         Complications: No apparent anesthesia complications

## 2016-02-03 NOTE — Brief Op Note (Signed)
02/03/2016  6:14 PM  PATIENT:  Brianna Mitchell  60 y.o. female  PRE-OPERATIVE DIAGNOSIS:  Left shoulder Biceps Tendonitis, Possible Rotator Cuff Tear, Acromioclavicular Osteoarthritis  POST-OPERATIVE DIAGNOSIS:  * No post-op diagnosis entered *  PROCEDURE:  Procedure(s): SHOULDER ARTHROSCOPY WITH SUBACROMIAL DECOMPRESSION, DEBRIDEMENT, BICEPS TENODESIS,  DISTAL CLAVICLE EXCISION.  SURGEON:  Surgeon(s): Meredith Pel, MD  ASSISTANT: Laure Kidney rnfa  ANESTHESIA:   general  EBL: 10 ml    No intake/output data recorded.  BLOOD ADMINISTERED: none  DRAINS: none   LOCAL MEDICATIONS USED:  none  SPECIMEN:  No Specimen  COUNTS:  YES  TOURNIQUET:  * No tourniquets in log *  DICTATION: .Other Dictation: Dictation Number [id number]  PLAN OF CARE: Discharge to home after PACU  PATIENT DISPOSITION:  PACU - hemodynamically stable

## 2016-02-03 NOTE — Anesthesia Procedure Notes (Signed)
Procedure Name: Intubation Date/Time: 02/03/2016 4:27 PM Performed by: Manus Gunning, Kalle Bernath J Pre-anesthesia Checklist: Patient identified, Emergency Drugs available, Suction available, Patient being monitored and Timeout performed Patient Re-evaluated:Patient Re-evaluated prior to inductionOxygen Delivery Method: Circle system utilized Preoxygenation: Pre-oxygenation with 100% oxygen Intubation Type: IV induction Ventilation: Mask ventilation without difficulty Laryngoscope Size: Mac and 3 Grade View: Grade II Tube type: Oral Tube size: 7.5 mm Number of attempts: 1 Placement Confirmation: ETT inserted through vocal cords under direct vision,  positive ETCO2 and breath sounds checked- equal and bilateral Secured at: 21 cm Tube secured with: Tape Dental Injury: Teeth and Oropharynx as per pre-operative assessment

## 2016-02-03 NOTE — Progress Notes (Signed)
2 patient belonging bags taken to PACU and placed in their cabinet, after patient went to the OR.

## 2016-02-04 ENCOUNTER — Encounter (HOSPITAL_COMMUNITY): Payer: Self-pay | Admitting: Orthopedic Surgery

## 2016-02-04 DIAGNOSIS — M7552 Bursitis of left shoulder: Secondary | ICD-10-CM | POA: Diagnosis not present

## 2016-02-04 DIAGNOSIS — E039 Hypothyroidism, unspecified: Secondary | ICD-10-CM | POA: Diagnosis not present

## 2016-02-04 DIAGNOSIS — K219 Gastro-esophageal reflux disease without esophagitis: Secondary | ICD-10-CM | POA: Diagnosis not present

## 2016-02-04 DIAGNOSIS — Z794 Long term (current) use of insulin: Secondary | ICD-10-CM | POA: Diagnosis not present

## 2016-02-04 DIAGNOSIS — M19012 Primary osteoarthritis, left shoulder: Secondary | ICD-10-CM | POA: Diagnosis not present

## 2016-02-04 DIAGNOSIS — R011 Cardiac murmur, unspecified: Secondary | ICD-10-CM | POA: Diagnosis not present

## 2016-02-04 DIAGNOSIS — E1165 Type 2 diabetes mellitus with hyperglycemia: Secondary | ICD-10-CM | POA: Diagnosis not present

## 2016-02-04 DIAGNOSIS — J45909 Unspecified asthma, uncomplicated: Secondary | ICD-10-CM | POA: Diagnosis not present

## 2016-02-04 DIAGNOSIS — Z23 Encounter for immunization: Secondary | ICD-10-CM | POA: Diagnosis not present

## 2016-02-04 DIAGNOSIS — Z882 Allergy status to sulfonamides status: Secondary | ICD-10-CM | POA: Diagnosis not present

## 2016-02-04 DIAGNOSIS — M7522 Bicipital tendinitis, left shoulder: Secondary | ICD-10-CM | POA: Diagnosis not present

## 2016-02-04 DIAGNOSIS — Z886 Allergy status to analgesic agent status: Secondary | ICD-10-CM | POA: Diagnosis not present

## 2016-02-04 DIAGNOSIS — Z981 Arthrodesis status: Secondary | ICD-10-CM | POA: Diagnosis not present

## 2016-02-04 DIAGNOSIS — I10 Essential (primary) hypertension: Secondary | ICD-10-CM | POA: Diagnosis not present

## 2016-02-04 DIAGNOSIS — Z885 Allergy status to narcotic agent status: Secondary | ICD-10-CM | POA: Diagnosis not present

## 2016-02-04 DIAGNOSIS — D649 Anemia, unspecified: Secondary | ICD-10-CM | POA: Diagnosis not present

## 2016-02-04 LAB — GLUCOSE, CAPILLARY
Glucose-Capillary: 164 mg/dL — ABNORMAL HIGH (ref 65–99)
Glucose-Capillary: 172 mg/dL — ABNORMAL HIGH (ref 65–99)
Glucose-Capillary: 158 mg/dL — ABNORMAL HIGH (ref 65–99)
Glucose-Capillary: 144 mg/dL — ABNORMAL HIGH (ref 65–99)
Glucose-Capillary: 87 mg/dL (ref 65–99)

## 2016-02-04 MED ORDER — NALOXONE HCL 0.4 MG/ML IJ SOLN
INTRAMUSCULAR | Status: AC
  Administered 2016-02-04: 0.4 mg
  Filled 2016-02-04: qty 1

## 2016-02-04 NOTE — Op Note (Signed)
NAME:  Brianna Mitchell, Brianna Mitchell NO.:  [id number]  MEDICAL RECORD NO.:  [id number]  LOCATION:  [facility]                         FACILITY:  [facility]  PHYSICIAN:  Anderson Malta, M.D.    DATE OF BIRTH:  1956/01/01  DATE OF PROCEDURE: DATE OF DISCHARGE:                              OPERATIVE REPORT   PREOPERATIVE DIAGNOSES:  Left shoulder acromioclavicular joint arthritis and biceps tendinitis.  POSTOPERATIVE DIAGNOSES:  Left shoulder acromioclavicular joint arthritis and biceps tendinitis.  PROCEDURES:  Left shoulder arthroscopy, limited debridement of the superior labrum, subacromial decompression, biceps tendon release, arthroscopic distal clavicle excision and open biceps tenodesis.  SURGEON:  Anderson Malta, M.D.  ASSISTANT:  Laure Kidney, RNFA.  INDICATIONS:  Brianna Mitchell is a 60 year old patient with left shoulder pain refractory to nonoperative management.  She presents for operative management after explanation of risks and benefits.  OPERATIVE FINDINGS: 1. Examination under anesthesia, range of motion, external rotation at     15 degrees, abduction 60 degrees, forward flexion is 170, isolated     glenohumeral abduction is 90, shoulder stability 1+ anterior-     posterior with less than a centimeter sulcus sign. 2. Diagnostic arthroscopy:     a.     Intra-articular biceps tenodesis.     b.     Intact rotator cuff tear repair.     c.     Degenerative tearing and fraying of the superior labrum.     d.     Significant bursitis.     e.     AC joint arthritis.  PROCEDURE IN DETAIL:  The patient was brought to the operating room where general anesthetic was induced.  The patient was placed in the beach-chair position with the head in neutral position.  Left arm, shoulder prescrubbed with alcohol and Betadine, allowed to air dry, prepped with DuraPrep solution and draped in a sterile manner.  Charlie Pitter was used to cover the operative field including the axilla.  Time-out was  called.  Solution of saline, epinephrine injected into the subacromial space.  Posterior portal created 2 cm medial and inferior to the posterolateral margin of the acromion.  Diagnostic arthroscopy was performed.  The patient had fraying of the superior labrum, intra- articular biceps tendinitis and an intact rotator cuff repair.  Biceps tendon was released.  Superior labrum debrided.  Anterior-inferior, posterior-inferior glenohumeral ligaments were intact.  Glenohumeral articular surfaces were intact.  Following this, the scope was placed into the subacromial space.  Anterior portal was earlier utilized for biceps tendon release, and was created parallel to the distal end of the clavicle.  Lateral portal was then created through a prior incision. Through these portals, the distal 8-9 mm of clavicle was excised with preservation of the posterior and superior ligaments.  Following that, bursectomy was performed and the rotator cuff was found to be intact from the bursal surface as well.  No full-thickness tear was identified. At this time, portals, anterior and posterior were closed.  Incision was made to the prior rotator cuff repair incision and the deltoid was split, marked with #1 Vicryl suture 4 cm from the anterolateral margin of the acromion.  The bicipital  groove was entered on the medial aspect and the biceps tendon was tenodesed using an [employer].  This gave good fixation of the biceps tendon into the bicipital groove.  At this time, thorough irrigation was performed.  Subacromial space was adequately decompressed after some rasping.  The incision was then closed and the deltoid was split using #1 Vicryl suture followed by interrupted inverted 2-0 Vicryl suture and a running 3-0 Monocryl. Impervious dressings and shoulder sling applied.  The patient tolerated the procedure well without immediate complications.     Anderson Malta, M.D.     GSD/MEDQ  D:   02/03/2016  T:  02/04/2016  Job:  [id number]

## 2016-02-04 NOTE — Progress Notes (Signed)
Occupational Therapy Treatment Patient Details Name: Brianna Mitchell MRN: [id number] DOB: 28-Dec-1955 Today's Date: 02/04/2016    History of present illness 60 y/o female presenting post-op LEFT SHOULDER ARTHROSCOPY WITH SUBACROMIAL DECOMPRESSION, DEBRIDEMENT, BICEPS TENODESIS,  DISTAL CLAVICLE EXCISION. Pt has a past medical history of Anemia; Asthma; Back pain; Carpal tunnel syndrome; Diabetes mellitus; Fibroid tumor; Ganglion cyst; GERD; Heart murmur; Hypertension; Hypothyroidism; Shortness of breath; Shoulder pain; Tendonitis; Tennis elbow; and Thyroid disease. Pt has a past surgical history that includes Tubal ligation; Abdominal hysterectomy; pelvic bone removed; Rotator cuff repair; Cholecystectomy; Back surgery; Thyroid surgery; Anterior lat lumbar fusion (01/03/2013); left heart catheterization with coronary angiogram (04/27/2011); and Knee arthroscopy (Right, 04/2014).   OT comments  Pt making progress towards OT goals this session and more alert. Reviewed entire shoulder dc handout again with Pt and performed all exercises this session. 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 OT genuinely concerned about Patient performing HEP upon d/c and HHOT would be appropriate to maintain compliance with active protocol and HEP as well as maximize independence in ADL and safety. OT would like to focus tomorrow's session on dressing and family education (family presence allowing).  Follow Up Recommendations  Supervision/Assistance - 24 hour;Other (comment);Home health OT (continue rehab of shoulder as ordered by MD at follow up)    Equipment Recommendations  None recommended by OT    Recommendations for Other Services      Precautions / Restrictions Precautions Precautions: Shoulder Type of Shoulder Precautions: Active Protocol Shoulder Interventions: Shoulder sling/immobilizer;For comfort (and sleep) Precaution Booklet  Issued: Yes (comment) Precaution Comments: OT shoulder handout reviewed in full Required Braces or Orthoses: Sling Restrictions Weight Bearing Restrictions: Yes LUE Weight Bearing: Non weight bearing       Mobility Bed Mobility               General bed mobility comments: Pt OOB in chair when OT arrived  Transfers Overall transfer level: Needs assistance Equipment used: 1 person hand held assist Transfers: Sit to/from Stand Sit to Stand: Min assist         General transfer comment: 1 HHA for safety, and only initially    Balance Overall balance assessment: Needs assistance Sitting-balance support: No upper extremity supported;Feet supported Sitting balance-Leahy Scale: Fair Sitting balance - Comments: in recliner   Standing balance support: Single extremity supported;During functional activity Standing balance-Leahy Scale: Fair Standing balance comment: 1 HHA                   ADL Overall ADL's : Needs assistance/impaired Eating/Feeding: Modified independent;Sitting Eating/Feeding Details (indicate cue type and reason): Pt eating salad and pasta when OT entered the room Grooming: Wash/dry face;Set up;Sitting Grooming Details (indicate cue type and reason): in recliner         Upper Body Dressing : Maximal assistance;Adhering to UE precautions;Sitting Upper Body Dressing Details (indicate cue type and reason): don/doff sling     Toilet Transfer: Minimal assistance;Comfort height toilet;Cueing for safety;Cueing for sequencing Toilet Transfer Details (indicate cue type and reason): simulated with recliner         Functional mobility during ADLs: Minimal assistance;Cueing for safety General ADL Comments: Pt with improved arousal this session      Vision                     Perception     Praxis  Cognition   Behavior During Therapy: WFL for tasks assessed/performed Overall Cognitive Status: Within Functional Limits for tasks  assessed                  General Comments: Pt still trouble with staying awake during session, but improved from this morning    Extremity/Trunk Assessment               Exercises Shoulder Exercises Shoulder Flexion: AROM;PROM;Left;10 reps;Supine (0-90) Shoulder ABduction: PROM;AROM;Left;10 reps;Supine;Seated (0-60) Shoulder External Rotation: PROM;AROM;Left;10 reps;Seated (0-30) Elbow Flexion: PROM;AROM;Left;10 reps;Standing Elbow Extension: PROM;AROM;Left;10 reps;Standing Wrist Flexion: AROM;Left Wrist Extension: AROM;Left Digit Composite Flexion: AROM;Left Composite Extension: AROM;Left Neck Flexion: AROM Neck Extension: AROM Neck Lateral Flexion - Right: AROM Neck Lateral Flexion - Left: AROM Donning/doffing shirt without moving shoulder: Maximal assistance Method for sponge bathing under operated UE: Minimal assistance Donning/doffing sling/immobilizer: Maximal assistance Correct positioning of sling/immobilizer: Maximal assistance ROM for elbow, wrist and digits of operated UE: Maximal assistance Sling wearing schedule (on at all times/off for ADL's): Maximal assistance Proper positioning of operated UE when showering: Maximal assistance Positioning of UE while sleeping: Maximal assistance   Shoulder Instructions Shoulder Instructions Donning/doffing shirt without moving shoulder: Maximal assistance Method for sponge bathing under operated UE: Minimal assistance Donning/doffing sling/immobilizer: Maximal assistance Correct positioning of sling/immobilizer: Maximal assistance ROM for elbow, wrist and digits of operated UE: Maximal assistance Sling wearing schedule (on at all times/off for ADL's): Maximal assistance Proper positioning of operated UE when showering: Maximal assistance Positioning of UE while sleeping: Maximal assistance     General Comments      Pertinent Vitals/ Pain       Pain Assessment: 0-10 Pain Score: 10-Worst pain ever Pain  Location: L shoulder Pain Descriptors / Indicators: Sore;Throbbing Pain Intervention(s): Monitored during session;Patient requesting pain meds-RN notified  Home Living                                          Prior Functioning/Environment              Frequency  Min 2X/week        Progress Toward Goals  OT Goals(current goals can now be found in the care plan section)  Progress towards OT goals: Progressing toward goals  Acute Rehab OT Goals Patient Stated Goal: to get home OT Goal Formulation: With patient  Plan Discharge plan needs to be updated    Co-evaluation                 End of Session Equipment Utilized During Treatment: Other (comment) (Sling)   Activity Tolerance Patient limited by lethargy   Patient Left in chair;with call bell/phone within reach   Nurse Communication Mobility status;Patient requests pain meds        Time: 1541-1605 OT Time Calculation (min): 24 min  Charges: OT General Charges $OT Visit: 1 Procedure OT Treatments $Self Care/Home Management : 8-22 mins $Therapeutic Exercise: 8-22 mins  Evern Bio [place] 02/04/2016, 5:00 PM  Sherryl Manges OTR/L [phone]

## 2016-02-04 NOTE — Progress Notes (Addendum)
Pt noted to be extremely drowsy since shift change this am. Pt is arousable and alert and oriented, however she will drift off to sleep in the middle of conversation. VSS-see flowheet. RN spoke with OT to see what pt was able to do. OT stated pt was only able to sit at EOB and that she will need to see pt again before d/c, IF pt does d/c today. RN called pt's daughter to see if this behavior is typical since pt takes opana q 8hrs at home. Daughter stated that sometimes she can be very sleepy, and that she tends to sleep during the day and stays awake at night. Discussed with Dr. Marlou Sa, who discontinued order for opana. He stated to continue with oxy only and d/c only if pt improves. Will continue to monitor.   [place], Jerry Caras

## 2016-02-04 NOTE — Progress Notes (Signed)
Pt stable Pain present but manageable Plan ot today with HEP and dc to home

## 2016-02-04 NOTE — Care Management Obs Status (Signed)
[place] NOTIFICATION   Patient Details  Name: NATYIA RAUF MRN: [id number] Date of Birth: 1956-01-11   Medicare Observation Status Notification Given:  Yes    Ninfa Meeker, RN 02/04/2016, 3:06 PM

## 2016-02-04 NOTE — Progress Notes (Signed)
RN reentered pt's room around lunch time and found pt to be even more lethargic and was loudly snoring-also seemed to have periods of apnea. VS still remained stable, 2L O2 via nasal cannula was applied for O2 at 90% on room air. Charge RN called, and Dr. Marlou Sa was updated. Received order for narcan. Rapid response RN called (however we had already administered narcan and pt was improved by the time she got there-so did not see pt). Pt was markedly improved within 5 minutes, complained of pain and was tearful. Pt's sister was present in the room at the time. Pt stated when she woke up that she "only wanted her daughter," and her sister left at that time. Sister stated that "they did not get along." Pt requested her sister not be allowed to visit anymore. Pt made xxx.   1:45 pm- RN entered pt's room and tried to have discussion with pt, however pt started to fall asleep again during conversation. She had also started snoring again. Dr. Marlou Sa notified again. Second dose of narcan given at 1:54pm with improvement within a few minutes. VSS. Since that time, pt has been appropriate with LOC. She was ambulated to the bathroom with assistance and sat in the chair for a few hours. Will continue to monitor.   [place], Jerry Caras

## 2016-02-04 NOTE — Anesthesia Postprocedure Evaluation (Signed)
Anesthesia Post Note  Patient: Kathlen Brunswick  Procedure(s) Performed: Procedure(s) (LRB): SHOULDER ARTHROSCOPY WITH SUBACROMIAL DECOMPRESSION, DEBRIDEMENT, BICEPS TENODESIS, DISTAL CLAVICLE EXCISION. (Left)  Patient location during evaluation: PACU Anesthesia Type: General and Regional Level of consciousness: sedated and patient cooperative Pain management: pain level controlled Vital Signs Assessment: post-procedure vital signs reviewed and stable Respiratory status: spontaneous breathing Cardiovascular status: stable Anesthetic complications: no       Last Vitals:  Vitals:   02/04/16 0524 02/04/16 1023  BP: (!) 149/92 119/88  Pulse: 96 64  Resp: 18 18  Temp: 37.1 C 37.1 C    Last Pain:  Vitals:   02/04/16 1023  TempSrc: Oral  PainSc: [place]

## 2016-02-04 NOTE — Evaluation (Signed)
Occupational Therapy Evaluation Patient Details Name: Brianna Mitchell MRN: [id number] DOB: [birth year] Today's Date: 02/04/2016    History of Present Illness 60 y/o female presenting post-op LEFT SHOULDER ARTHROSCOPY WITH SUBACROMIAL DECOMPRESSION, DEBRIDEMENT, BICEPS TENODESIS,  DISTAL CLAVICLE EXCISION. Pt has a past medical history of Anemia; Asthma; Back pain; Carpal tunnel syndrome; Diabetes mellitus; Fibroid tumor; Ganglion cyst; GERD; Heart murmur; Hypertension; Hypothyroidism; Shortness of breath; Shoulder pain; Tendonitis; Tennis elbow; and Thyroid disease. Pt has a past surgical history that includes Tubal ligation; Abdominal hysterectomy; pelvic bone removed; Rotator cuff repair; Cholecystectomy; Back surgery; Thyroid surgery; Anterior lat lumbar fusion (01/03/2013); left heart catheterization with coronary angiogram (04/27/2011); and Knee arthroscopy (Right, 04/2014).   Clinical Impression   PTA Pt independent in ADL and mobility. This session extremely limited due to lethargy suspect to medication. Pt unable to retain any information provided orally by the OT (handouts provided in room). Pt unable to maintain arousal even sitting EOB. OT will need to see Pt prior to d/c to review shoulder handout again and review HEP as well.     Follow Up Recommendations  Supervision/Assistance - 24 hour;Other (comment) (progress rehab of shoulder as ordered by MD at follow up)    Equipment Recommendations  Other (comment);None recommended by OT (unsure if home information obtained is correct)    Recommendations for Other Services       Precautions / Restrictions Precautions Precautions: Shoulder Type of Shoulder Precautions: Active Protocol Shoulder Interventions: Shoulder sling/immobilizer;For comfort (and sleep) Precaution Booklet Issued: Yes (comment) Precaution Comments: OT handout reviewed in full Required Braces or Orthoses: Sling Restrictions Weight Bearing Restrictions: Yes LUE  Weight Bearing: Non weight bearing      Mobility Bed Mobility Overal bed mobility: Needs Assistance Bed Mobility: Supine to Sit;Sit to Supine     Supine to sit: Min assist;HOB elevated Sit to supine: Mod assist;HOB elevated   General bed mobility comments: Pt needed min assist to bring trunk up to sit EOB, and mod assist to get back in bed with BLE, then again with bed positioning (moving up in the bed) Assist required due to lethargy suspect for medication  Transfers                 General transfer comment: not attempted this session due to inability to stay awake, even sitting EOB    Balance Overall balance assessment: Needs assistance Sitting-balance support: Single extremity supported Sitting balance-Leahy Scale: Poor Sitting balance - Comments: Pt unable to sit EOB, Therapist required to hold Pt to ensure safety                                    ADL Overall ADL's : Needs assistance/impaired                                       General ADL Comments: Pt dependent in ADL, unable to arouse Pt nough to safely stand due to medication. Pt able to follow one step commands 75% of the time, and Pt able to answer background information one question at a time sometimes with multiple cues.     Vision Vision Assessment?: No apparent visual deficits   Perception     Praxis      Pertinent Vitals/Pain Pain Assessment: 0-10 Pain Score: 10-Worst pain ever Pain Location: L shoulder Pain Descriptors /  Indicators: Sore;Throbbing Pain Intervention(s): Limited activity within patient's tolerance;Monitored during session;Ice applied     Hand Dominance Right   Extremity/Trunk Assessment Upper Extremity Assessment Upper Extremity Assessment: LUE deficits/detail LUE Deficits / Details: decrease in strength and ROM post op LUE: Unable to fully assess due to immobilization;Unable to fully assess due to pain   Lower Extremity Assessment Lower  Extremity Assessment: Difficult to assess due to impaired cognition (from medication)   Cervical / Trunk Assessment Cervical / Trunk Assessment: Other exceptions Cervical / Trunk Exceptions: history of back surgery   Communication Communication Communication: No difficulties   Cognition Arousal/Alertness: Suspect due to medications Behavior During Therapy: Flat affect Overall Cognitive Status: Difficult to assess                     General Comments       Exercises Exercises: Shoulder     Shoulder Instructions Shoulder Instructions Donning/doffing shirt without moving shoulder: Maximal assistance Method for sponge bathing under operated UE: Minimal assistance Donning/doffing sling/immobilizer: Maximal assistance Correct positioning of sling/immobilizer: Maximal assistance ROM for elbow, wrist and digits of operated UE: Maximal assistance Sling wearing schedule (on at all times/off for ADL's): Maximal assistance Proper positioning of operated UE when showering: Maximal assistance Positioning of UE while sleeping: Maximal assistance    Home Living Family/patient expects to be discharged to:: Private residence Living Arrangements: Children;Other (Comment) (small puppy) Available Help at Discharge: Family;Available PRN/intermittently Type of Home: Apartment Home Access: Stairs to enter Entrance Stairs-Number of Steps: 3   Home Layout: One level     Bathroom Shower/Tub: Tub/shower unit Shower/tub characteristics: Architectural technologist: Standard Bathroom Accessibility: Yes   Home Equipment: Environmental consultant - 2 wheels;Cane - single point;Bedside commode;Hand held shower head          Prior Functioning/Environment Level of Independence: Independent                 OT Problem List: Decreased strength;Decreased range of motion;Decreased activity tolerance;Impaired balance (sitting and/or standing);Decreased safety awareness;Decreased knowledge of  precautions;Impaired UE functional use;Pain   OT Treatment/Interventions: Self-care/ADL training;Therapeutic exercise;DME and/or AE instruction;Patient/family education    OT Goals(Current goals can be found in the care plan section) Acute Rehab OT Goals Patient Stated Goal: none stated OT Goal Formulation: Patient unable to participate in goal setting ADL Goals Pt Will Perform Grooming: with supervision;standing Pt Will Perform Upper Body Dressing: with min assist;with caregiver independent in assisting;sitting Pt/caregiver will Perform Home Exercise Program: Left upper extremity;With written HEP provided Additional ADL Goal #1: Pt will state sling wearing schedule with no verbal cues Additional ADL Goal #2: Pt will demonstrate proper way to clean under armpit safely  OT Frequency: Min 2X/week   Barriers to D/C: Other (comment) (Unable to confirm caregiving support upon d/c)          Co-evaluation              End of Session Equipment Utilized During Treatment: Other (comment) (sling - repositioned so that it fit properly) Nurse Communication: Mobility status;Other (comment) (need a second session to continue education and HEP)  Activity Tolerance: Patient limited by lethargy Patient left: in bed;with call bell/phone within reach   Time: 0900-0917 OT Time Calculation (min): 17 min Charges:  OT General Charges $OT Visit: 1 Procedure OT Evaluation $OT Eval Moderate Complexity: 1 Procedure G-Codes: OT G-codes **NOT FOR INPATIENT CLASS** Functional Assessment Tool Used: Clinical Judgement Functional Limitation: Self care Self Care Current Status [id number]): At least 80 percent  but less than 100 percent impaired, limited or restricted Self Care Goal Status [id number]): At least 40 percent but less than 60 percent impaired, limited or restricted  Jaci Carrel 02/04/2016, 10:50 AM  Hulda Humphrey OTR/L [phone]

## 2016-02-05 DIAGNOSIS — J45909 Unspecified asthma, uncomplicated: Secondary | ICD-10-CM | POA: Diagnosis not present

## 2016-02-05 DIAGNOSIS — R011 Cardiac murmur, unspecified: Secondary | ICD-10-CM | POA: Diagnosis not present

## 2016-02-05 DIAGNOSIS — M7552 Bursitis of left shoulder: Secondary | ICD-10-CM | POA: Diagnosis not present

## 2016-02-05 DIAGNOSIS — E1165 Type 2 diabetes mellitus with hyperglycemia: Secondary | ICD-10-CM | POA: Diagnosis not present

## 2016-02-05 DIAGNOSIS — D649 Anemia, unspecified: Secondary | ICD-10-CM | POA: Diagnosis not present

## 2016-02-05 DIAGNOSIS — M19012 Primary osteoarthritis, left shoulder: Secondary | ICD-10-CM | POA: Diagnosis not present

## 2016-02-05 DIAGNOSIS — I10 Essential (primary) hypertension: Secondary | ICD-10-CM | POA: Diagnosis not present

## 2016-02-05 DIAGNOSIS — Z885 Allergy status to narcotic agent status: Secondary | ICD-10-CM | POA: Diagnosis not present

## 2016-02-05 DIAGNOSIS — Z886 Allergy status to analgesic agent status: Secondary | ICD-10-CM | POA: Diagnosis not present

## 2016-02-05 DIAGNOSIS — E039 Hypothyroidism, unspecified: Secondary | ICD-10-CM | POA: Diagnosis not present

## 2016-02-05 DIAGNOSIS — Z23 Encounter for immunization: Secondary | ICD-10-CM | POA: Diagnosis not present

## 2016-02-05 DIAGNOSIS — Z794 Long term (current) use of insulin: Secondary | ICD-10-CM | POA: Diagnosis not present

## 2016-02-05 DIAGNOSIS — M7522 Bicipital tendinitis, left shoulder: Secondary | ICD-10-CM | POA: Diagnosis not present

## 2016-02-05 DIAGNOSIS — Z882 Allergy status to sulfonamides status: Secondary | ICD-10-CM | POA: Diagnosis not present

## 2016-02-05 DIAGNOSIS — Z981 Arthrodesis status: Secondary | ICD-10-CM | POA: Diagnosis not present

## 2016-02-05 DIAGNOSIS — K219 Gastro-esophageal reflux disease without esophagitis: Secondary | ICD-10-CM | POA: Diagnosis not present

## 2016-02-05 LAB — GLUCOSE, CAPILLARY
Glucose-Capillary: 45 mg/dL — ABNORMAL LOW (ref 65–99)
Glucose-Capillary: 130 mg/dL — ABNORMAL HIGH (ref 65–99)
Glucose-Capillary: 73 mg/dL (ref 65–99)

## 2016-02-05 NOTE — Progress Notes (Signed)
Patient educated on DC instructions and understands all teaching.  No further questions or concerns.  Will continue to monitor for any changes.

## 2016-02-05 NOTE — Progress Notes (Signed)
Pt stable more awake this am Plan dc today with hhc No opana on dc

## 2016-02-05 NOTE — Progress Notes (Signed)
Occupational Therapy Treatment and Discharge Patient Details Name: Brianna Mitchell MRN: [id number] DOB: 1955-05-24 Today's Date: 02/05/2016    History of present illness 60 y/o female presenting post-op LEFT SHOULDER ARTHROSCOPY WITH SUBACROMIAL DECOMPRESSION, DEBRIDEMENT, BICEPS TENODESIS,  DISTAL CLAVICLE EXCISION. Pt has a past medical history of Anemia; Asthma; Back pain; Carpal tunnel syndrome; Diabetes mellitus; Fibroid tumor; Ganglion cyst; GERD; Heart murmur; Hypertension; Hypothyroidism; Shortness of breath; Shoulder pain; Tendonitis; Tennis elbow; and Thyroid disease. Pt has a past surgical history that includes Tubal ligation; Abdominal hysterectomy; pelvic bone removed; Rotator cuff repair; Cholecystectomy; Back surgery; Thyroid surgery; Anterior lat lumbar fusion (01/03/2013); left heart catheterization with coronary angiogram (04/27/2011); and Knee arthroscopy (Right, 04/2014).   OT comments  Pt much more alert and awake this morning for session. OT shoulder handout reviewed in full, and HEP practiced with PT. Pt fully educated and assessed. Pt no longer in need for HHOT for compliance with HEP after this mornings session. Pt also able to perform grooming at sink and functional ADL in room at supervision level. Pt fully assessed, received all education, and has met acute OT goals. OT to sign off with rehab of shoulder to progress as ordered by MD at follow up. Thank you for this referral.   Follow Up Recommendations  No OT follow up;Supervision - Intermittent ; progress rehab as ordered by MD at follow up   Equipment Recommendations  None recommended by OT    Recommendations for Other Services      Precautions / Restrictions Precautions Precautions: Shoulder Type of Shoulder Precautions: Active Protocol Shoulder Interventions: Shoulder sling/immobilizer;For comfort (and sleep) Precaution Booklet Issued: Yes (comment) Precaution Comments: OT shoulder handout reviewed in full  prior to dc Required Braces or Orthoses: Sling       Mobility Bed Mobility Overal bed mobility: Needs Assistance Bed Mobility: Supine to Sit     Supine to sit: Min assist     General bed mobility comments: Pt with min assist with trunk to EOB this session  Transfers Overall transfer level: Needs assistance Equipment used: None (sling) Transfers: Sit to/from Stand Sit to Stand: Supervision              Balance Overall balance assessment: Needs assistance Sitting-balance support: No upper extremity supported;Feet supported Sitting balance-Leahy Scale: Good Sitting balance - Comments: EOB for exercises   Standing balance support: No upper extremity supported;During functional activity Standing balance-Leahy Scale: Good Standing balance comment: sink level ADL, peri care                   ADL Overall ADL's : Needs assistance/impaired Eating/Feeding: Modified independent;Sitting Eating/Feeding Details (indicate cue type and reason): Pt eating breakfast Grooming: Wash/dry hands;Supervision/safety;Standing Grooming Details (indicate cue type and reason): sink level         Upper Body Dressing : Maximal assistance;Adhering to UE precautions;Sitting Upper Body Dressing Details (indicate cue type and reason): don/doff sling     Toilet Transfer: Supervision/safety;Ambulation;Comfort height toilet Toilet Transfer Details (indicate cue type and reason): in bathroom, no assist needed Toileting- Clothing Manipulation and Hygiene: Supervision/safety;Sit to/from stand Toileting - Clothing Manipulation Details (indicate cue type and reason): able to manage hospital gown, gather toilet paper, and perform peri care     Functional mobility during ADLs: Min guard (to manage IV) General ADL Comments: Pt with improved arousal this session      Vision  Perception     Praxis      Cognition   Behavior During Therapy: WFL for tasks  assessed/performed Overall Cognitive Status: Within Functional Limits for tasks assessed                       Extremity/Trunk Assessment               Exercises Shoulder Exercises Shoulder Flexion: AROM;PROM;Left;10 reps;Supine (0-90) Shoulder ABduction: PROM;AROM;Left;10 reps;Supine;Seated Shoulder External Rotation: PROM;AROM;Left;10 reps;Seated Elbow Flexion: PROM;AROM;Left;10 reps;Standing Elbow Extension: PROM;AROM;Left;10 reps;Standing Wrist Flexion: AROM;Left Wrist Extension: AROM;Left Digit Composite Flexion: AROM;Left Composite Extension: AROM;Left Neck Flexion: AROM Neck Extension: AROM Neck Lateral Flexion - Right: AROM Neck Lateral Flexion - Left: AROM Donning/doffing shirt without moving shoulder: Maximal assistance;Patient able to independently direct caregiver Method for sponge bathing under operated UE: Supervision/safety Donning/doffing sling/immobilizer: Maximal assistance;Patient able to independently direct caregiver Correct positioning of sling/immobilizer: Modified independent ROM for elbow, wrist and digits of operated UE: Modified independent Sling wearing schedule (on at all times/off for ADL's): Modified independent Proper positioning of operated UE when showering: Minimal assistance;Patient able to independently direct caregiver Positioning of UE while sleeping: Modified independent   Shoulder Instructions Shoulder Instructions Donning/doffing shirt without moving shoulder: Maximal assistance;Patient able to independently direct caregiver Method for sponge bathing under operated UE: Supervision/safety Donning/doffing sling/immobilizer: Maximal assistance;Patient able to independently direct caregiver Correct positioning of sling/immobilizer: Modified independent ROM for elbow, wrist and digits of operated UE: Modified independent Sling wearing schedule (on at all times/off for ADL's): Modified independent Proper positioning of operated UE  when showering: Minimal assistance;Patient able to independently direct caregiver Positioning of UE while sleeping: Modified independent     General Comments      Pertinent Vitals/ Pain       Pain Assessment: 0-10 Pain Score: 7  Pain Descriptors / Indicators: Sore;Throbbing Pain Intervention(s): Monitored during session;Limited activity within patient's tolerance  Home Living                                          Prior Functioning/Environment              Frequency  Min 2X/week        Progress Toward Goals  OT Goals(current goals can now be found in the care plan section)  Progress towards OT goals: Goals met/education completed, patient discharged from OT  Acute Rehab OT Goals Patient Stated Goal: to get home OT Goal Formulation: With patient  Plan Discharge plan needs to be updated;All goals met and education completed, patient discharged from OT services    Co-evaluation                 End of Session     Activity Tolerance Patient tolerated treatment well   Patient Left in chair;with call bell/phone within reach   Nurse Communication Mobility status;Other (comment) (Pt asking for information about dc)        Time: 4782-9562 OT Time Calculation (min): 36 min  Charges: OT General Charges $OT Visit: 1 Procedure OT Treatments $Self Care/Home Management : 8-22 mins $Therapeutic Exercise: 8-22 mins  Evern Bio [place] 02/05/2016, 9:19 AM  Sherryl Manges OTR/L [phone]

## 2016-02-05 NOTE — Discharge Instructions (Signed)
Shoulder Arthroscopy, Care After Refer to this sheet in the next few weeks. These instructions provide you with information on caring for yourself after your procedure. Your health care provider may also give you more specific instructions. Your treatment has been planned according to current medical practices, but problems sometimes occur. Call your health care provider if you have any problems or questions after your procedure. What can I expect after the procedure? After your procedure, it is typical to have the following:  Pain at the site of the surgical cuts (incisions).  Stiffness in your shoulder. This should gradually decrease over time. Your health care provider may recommend physical therapy to help improve this.  Nausea, vomiting, or constipation. These symptoms can result from taking pain medicine after surgery.  Clear or red drainage from the incision sites. This is normal for a few days after surgery.  Fatigue. Follow these instructions at home:  Take medicines only as directed by your health care provider.  Use a sling as directed.  There are many different ways to close and cover an incision, including stitches, skin glue, and adhesive strips. Follow your health care provider's instructions on:  Incision care.  Bandage (dressing) changes and removal.  Incision closure removal.  Apply ice to the injured area:  Put ice in a plastic bag.  Place a towel between your skin and the bag.  Leave the ice on for 20 minutes, 2-3 times a day.  If physical therapy and exercises are prescribed by your health care provider, do them as directed.  Keep all follow-up visits as directed by your health care provider. This is important. Contact a health care provider if: You have a fever. Get help right away if:  You have drainage, redness, swelling, or increasing pain at the incision site.  You notice a bad smell coming from the incision site or dressing.  Your incision  site breaks open after your stitches or tape has been removed. This information is not intended to replace advice given to you by your health care provider. Make sure you discuss any questions you have with your health care provider. Document Released: 08/29/2013 Document Revised: 09/28/2015 Document Reviewed: 03/23/2013 Elsevier Interactive Patient Education  2017 [place], Adult An incision is a cut that a doctor makes in your skin for surgery (for a procedure). Most times, these cuts are closed after surgery. Your cut from surgery may be closed with stitches (sutures), staples, skin glue, or skin tape (adhesive strips). You may need to return to your doctor to have stitches or staples taken out. This may happen many days or many weeks after your surgery. The cut needs to be well cared for so it does not get infected. How to care for your cut Cut care  Follow instructions from your doctor about how to take care of your cut. Make sure you:  Wash your hands with soap and water before you change your bandage (dressing). If you cannot use soap and water, use hand sanitizer.  Change your bandage as told by your doctor.  Leave stitches, skin glue, or skin tape in place. They may need to stay in place for 2 weeks or longer. If tape strips get loose and curl up, you may trim the loose edges. Do not remove tape strips completely unless your doctor says it is okay.  Check your cut area every day for signs of infection. Check for:  More redness, swelling, or pain.  More fluid or blood.  Warmth.  Pus or a bad smell.  Ask your doctor how to clean the cut. This may include:  Using mild soap and water.  Using a clean towel to pat the cut dry after you clean it.  Putting a cream or ointment on the cut. Do this only as told by your doctor.  Covering the cut with a clean bandage.  Ask your doctor when you can leave the cut uncovered.  Do not take baths, swim, or use a hot  tub until your doctor says it is okay. Ask your doctor if you can take showers. You may only be allowed to take sponge baths for bathing. Medicines  If you were prescribed an antibiotic medicine, cream, or ointment, take the antibiotic or put it on the cut as told by your doctor. Do not stop taking or putting on the antibiotic even if your condition gets better.  Take over-the-counter and prescription medicines only as told by your doctor. General instructions  Limit movement around your cut. This helps healing.  Avoid straining, lifting, or exercise for the first month, or for as long as told by your doctor.  Follow instructions from your doctor about going back to your normal activities.  Ask your doctor what activities are safe.  Protect your cut from the sun when you are outside for the first 6 months, or for as long as told by your doctor. Put on sunscreen around the scar or cover up the scar.  Keep all follow-up visits as told by your doctor. This is important. Contact a doctor if:  Your have more redness, swelling, or pain around the cut.  You have more fluid or blood coming from the cut.  Your cut feels warm to the touch.  You have pus or a bad smell coming from the cut.  You have a fever or shaking chills.  You feel sick to your stomach (nauseous) or you throw up (vomit).  You are dizzy.  Your stitches or staples come undone. Get help right away if:  You have a red streak coming from your cut.  Your cut bleeds through the bandage and the bleeding does not stop with gentle pressure.  The edges of your cut open up and separate.  You have very bad (severe) pain.  You have a rash.  You are confused.  You pass out (faint).  You have trouble breathing and you have a fast heartbeat. This information is not intended to replace advice given to you by your health care provider. Make sure you discuss any questions you have with your health care provider. Document  Released: 04/26/2011 Document Revised: 10/10/2015 Document Reviewed: 10/10/2015 Elsevier Interactive Patient Education  2017 [employer].

## 2016-02-18 ENCOUNTER — Ambulatory Visit (INDEPENDENT_AMBULATORY_CARE_PROVIDER_SITE_OTHER): Payer: Medicare Other | Admitting: Orthopaedic Surgery

## 2016-02-18 ENCOUNTER — Inpatient Hospital Stay (INDEPENDENT_AMBULATORY_CARE_PROVIDER_SITE_OTHER): Payer: Medicare Other | Admitting: Orthopedic Surgery

## 2016-02-18 DIAGNOSIS — Z9889 Other specified postprocedural states: Secondary | ICD-10-CM

## 2016-02-18 MED ORDER — OXYCODONE-ACETAMINOPHEN 5-325 MG PO TABS: 1.0000 | ORAL_TABLET | Freq: Four times a day (QID) | ORAL | 0 refills | Status: DC | PRN

## 2016-02-18 NOTE — Progress Notes (Signed)
The patient is 2 weeks status post a left shoulder arthroscopy with Dr. Marlou Sa is a subacromial decompression biceps tenodesis and distal clavicle excision. She's having pain in her shoulder. She is otherwise doing well.  On exam all her suture lines are intact remove the sutures which The Steri-Strips in place. Her axillary nerve is functioning her shoulders well located.  At this point will refill her pain medications she'll follow-up with Dr. Marlou Sa in 2 weeks. She'll avoid heavy lifting and overhead activities of that shoulder until she follows up with Dr. Marlou Sa postoperatively.

## 2016-02-19 DIAGNOSIS — I158 Other secondary hypertension: Secondary | ICD-10-CM | POA: Diagnosis not present

## 2016-02-19 DIAGNOSIS — M4326 Fusion of spine, lumbar region: Secondary | ICD-10-CM | POA: Diagnosis not present

## 2016-02-19 DIAGNOSIS — J4520 Mild intermittent asthma, uncomplicated: Secondary | ICD-10-CM | POA: Diagnosis not present

## 2016-02-20 DIAGNOSIS — J45998 Other asthma: Secondary | ICD-10-CM | POA: Diagnosis not present

## 2016-02-24 NOTE — Discharge Summary (Signed)
Physician Discharge Summary  Patient ID: Brianna Mitchell MRN: [id number] DOB/AGE: 19-Mar-1955 61 y.o.  Admit date: 02/03/2016 Discharge date: 02/24/2016  Admission Diagnoses:  Active Problems:   Shoulder arthritis   Discharge Diagnoses:  Same  Surgeries: Procedure(s): SHOULDER ARTHROSCOPY WITH SUBACROMIAL DECOMPRESSION, DEBRIDEMENT, BICEPS TENODESIS, DISTAL CLAVICLE EXCISION. on 02/03/2016   Consultants:   Discharged Condition: Stable  Hospital Course: Brianna Mitchell is an 62 y.o. female who was admitted 02/03/2016 with a chief complaint of Shoulder pain, and found to have a diagnosis of shoulder bursitis and biceps tendinitis.  They were brought to the operating room on 02/03/2016 and underwent the above named procedures.  She tolerated the procedure well without immediate complications.  She was on significant amounts of pain medicine by her report preoperatively.  These medications were continued postoperatively but a cost her to become oversedated.  His problem was managed her the ensuing 48 hours.  She is discharged home in good condition on minimal amount of pain medicine.  She had been in pain clinic before.  She will likely need to return to the pain clinic at sometime in the future but at this time her pain is being managed well on a minimal amount of pain medicine.  She will start physical therapy and a home exercise program after her first clinic visit.    Antibiotics given:  Anti-infectives    Start     Dose/Rate Route Frequency Ordered Stop   02/03/16 2230  ceFAZolin (ANCEF) IVPB 2g/100 mL premix     2 g 200 mL/hr over 30 Minutes Intravenous Every 6 hours 02/03/16 2136 02/04/16 1030   02/03/16 1245  ceFAZolin (ANCEF) IVPB 2g/100 mL premix     2 g 200 mL/hr over 30 Minutes Intravenous On call to O.R. 02/03/16 1226 02/03/16 1614   02/03/16 1229  ceFAZolin (ANCEF) 2-4 GM/100ML-% IVPB    Comments:  Henrine Screws   : cabinet override      02/03/16 1229 02/03/16 1614     .  Recent vital signs:  Vitals:   02/04/16 2200 02/05/16 0433  BP: (!) 146/74 131/65  Pulse: 94 83  Resp: 18 16  Temp: 99.3 F (37.4 C) 99 F (37.2 C)    Recent laboratory studies:  Results for orders placed or performed during the hospital encounter of 02/03/16  Glucose, capillary  Result Value Ref Range   Glucose-Capillary 180 (H) 65 - 99 mg/dL  Glucose, capillary  Result Value Ref Range   Glucose-Capillary 143 (H) 65 - 99 mg/dL   Comment 1 Notify RN    Comment 2 Document in Chart   Glucose, capillary  Result Value Ref Range   Glucose-Capillary 134 (H) 65 - 99 mg/dL  Glucose, capillary  Result Value Ref Range   Glucose-Capillary 150 (H) 65 - 99 mg/dL  Glucose, capillary  Result Value Ref Range   Glucose-Capillary 171 (H) 65 - 99 mg/dL  Glucose, capillary  Result Value Ref Range   Glucose-Capillary 172 (H) 65 - 99 mg/dL  Glucose, capillary  Result Value Ref Range   Glucose-Capillary 164 (H) 65 - 99 mg/dL  Glucose, capillary  Result Value Ref Range   Glucose-Capillary 158 (H) 65 - 99 mg/dL  Glucose, capillary  Result Value Ref Range   Glucose-Capillary 144 (H) 65 - 99 mg/dL  Glucose, capillary  Result Value Ref Range   Glucose-Capillary 87 65 - 99 mg/dL  Glucose, capillary  Result Value Ref Range   Glucose-Capillary 45 (L) 65 - 99 mg/dL  Glucose, capillary  Result Value Ref Range   Glucose-Capillary 73 65 - 99 mg/dL  Glucose, capillary  Result Value Ref Range   Glucose-Capillary 130 (H) 65 - 99 mg/dL    Discharge Medications:   Allergies as of 02/05/2016      Reactions   Aspirin Swelling   Sulfa Antibiotics Swelling   Demerol Rash      Medication List    STOP taking these medications   FLECTOR 1.3 % Ptch Generic drug:  diclofenac   OPANA ER (CRUSH RESISTANT) 40 MG T12a Generic drug:  Oxymorphone HCl (Crush Resist)   oxyCODONE-acetaminophen 5-325 MG tablet Commonly known as:  PERCOCET/ROXICET     TAKE these medications   albuterol 108  (90 Base) MCG/ACT inhaler Commonly known as:  PROVENTIL HFA;VENTOLIN HFA Inhale 2 puffs into the lungs every 6 (six) hours as needed. For shortness of breath   albuterol (2.5 MG/3ML) 0.083% nebulizer solution Commonly known as:  PROVENTIL Take 2.5 mg by nebulization every 6 (six) hours as needed. For shortness of breath   beclomethasone 80 MCG/ACT inhaler Commonly known as:  QVAR Inhale 1 puff into the lungs daily with lunch.   DEXILANT 60 MG capsule Generic drug:  dexlansoprazole Take 60 mg by mouth daily.   EVZIO 0.4 MG/0.4ML Soaj Generic drug:  Naloxone HCl   HUMALOG KWIKPEN 100 UNIT/ML KiwkPen Generic drug:  insulin lispro Inject 5-14 Units into the skin 3 (three) times daily. Based on sliding scale   HUMULIN N KWIKPEN 100 UNIT/ML Kiwkpen Generic drug:  Insulin NPH (Human) (Isophane) Inject 30-40 Units into the skin 2 (two) times daily. 30 units in the morning and 40 units in the evening   levothyroxine 75 MCG tablet Commonly known as:  SYNTHROID, LEVOTHROID Take 75 mcg by mouth daily.   ondansetron 4 MG disintegrating tablet Commonly known as:  ZOFRAN-ODT Take 1 tablet (4 mg total) by mouth every 8 (eight) hours as needed for nausea or vomiting.   ONE TOUCH ULTRA TEST test strip Generic drug:  glucose blood   polyethylene glycol packet Commonly known as:  MIRALAX / GLYCOLAX Take 17 g by mouth daily. What changed:  when to take this  reasons to take this   rizatriptan 10 MG disintegrating tablet Commonly known as:  MAXALT-MLT Take 1 tablet (10 mg total) by mouth as needed for migraine. May repeat in 2 hours if needed   rosuvastatin 40 MG tablet Commonly known as:  CRESTOR Take 40 mg by mouth daily.   topiramate 100 MG tablet Commonly known as:  TOPAMAX Take 1 tablet (100 mg total) by mouth 2 (two) times daily.       Diagnostic Studies: No results found.  Disposition: 01-Home or Self Care  Discharge Instructions    Call MD / Call 911    Complete  by:  As directed    If you experience chest pain or shortness of breath, CALL 911 and be transported to the hospital emergency room.  If you develope a fever above 101 F, pus (white drainage) or increased drainage or redness at the wound, or calf pain, call your surgeon's office.   Call MD / Call 911    Complete by:  As directed    If you experience chest pain or shortness of breath, CALL 911 and be transported to the hospital emergency room.  If you develope a fever above 101 F, pus (white drainage) or increased drainage or redness at the wound, or calf pain, call your surgeon's  office.   Call MD / Call 911    Complete by:  As directed    If you experience chest pain or shortness of breath, CALL 911 and be transported to the hospital emergency room.  If you develope a fever above 101 F, pus (white drainage) or increased drainage or redness at the wound, or calf pain, call your surgeon's office.   Constipation Prevention    Complete by:  As directed    Drink plenty of fluids.  Prune juice may be helpful.  You may use a stool softener, such as Colace (over the counter) 100 mg twice a day.  Use MiraLax (over the counter) for constipation as needed.   Constipation Prevention    Complete by:  As directed    Drink plenty of fluids.  Prune juice may be helpful.  You may use a stool softener, such as Colace (over the counter) 100 mg twice a day.  Use MiraLax (over the counter) for constipation as needed.   Constipation Prevention    Complete by:  As directed    Drink plenty of fluids.  Prune juice may be helpful.  You may use a stool softener, such as Colace (over the counter) 100 mg twice a day.  Use MiraLax (over the counter) for constipation as needed.   Diet - low sodium heart healthy    Complete by:  As directed    Diet - low sodium heart healthy    Complete by:  As directed    Diet - low sodium heart healthy    Complete by:  As directed    Discharge instructions    Complete by:  As directed     Begin CPM machine tomorrow 1 hour 3 times a day Okay to shower dressings are waterproof No lifting with left arm   Discharge instructions    Complete by:  As directed    See prior instructions   Discharge instructions    Complete by:  As directed    See prior orders   Increase activity slowly as tolerated    Complete by:  As directed    Increase activity slowly as tolerated    Complete by:  As directed    Increase activity slowly as tolerated    Complete by:  As directed          Signed: Anderson Malta 02/24/2016, 6:18 PM

## 2016-03-04 ENCOUNTER — Ambulatory Visit (INDEPENDENT_AMBULATORY_CARE_PROVIDER_SITE_OTHER): Payer: Medicare Other | Admitting: Orthopedic Surgery

## 2016-03-08 ENCOUNTER — Encounter (INDEPENDENT_AMBULATORY_CARE_PROVIDER_SITE_OTHER): Payer: Self-pay | Admitting: Orthopedic Surgery

## 2016-03-08 ENCOUNTER — Ambulatory Visit (INDEPENDENT_AMBULATORY_CARE_PROVIDER_SITE_OTHER): Payer: Medicare Other | Admitting: Orthopedic Surgery

## 2016-03-08 DIAGNOSIS — M19019 Primary osteoarthritis, unspecified shoulder: Secondary | ICD-10-CM

## 2016-03-08 MED ORDER — OXYCODONE-ACETAMINOPHEN 5-325 MG PO TABS: 1.0000 | ORAL_TABLET | Freq: Three times a day (TID) | ORAL | 0 refills | Status: DC | PRN

## 2016-03-08 NOTE — Progress Notes (Signed)
Post-Op Visit Note   Patient: Brianna Mitchell           Date of Birth: [birth year]           MRN: [id number] Visit Date: 03/08/2016 PCP: Maximino Greenland, MD   Assessment & Plan:  Chief Complaint:  Chief Complaint  Patient presents with  . Left Shoulder - Routine Post Op   Visit Diagnoses:  1. Shoulder arthritis     Plan: Plan is a patient who is now a month out left shoulder arthroscopy biceps tenodesis and distal clavicle excision having a lot of pain.  She's doing her own physical therapy.  She had some pain medicine in the hospital but she's not currently on Opana.  She uses heat and oxycodone on a sparing basis.  She's had multiple back surgeries.  On exam she does have reasonable passive range of motion.  Incisions are well-healed.  Plan is to put her on a tapered dose of oxycodone.  Continue with her current regimen of home therapy.  Her 12 month history is reviewed.  I want to do an arc check on her before her next visit.  She was on an extremely high dose of Opana so it's not surprising that she's having some pain now.  She is on a 2-3 times a day dosing of oxycodone at its lowest dose.  Follow-Up Instructions: Return in about 4 weeks (around 04/05/2016).   Orders:  No orders of the defined types were placed in this encounter.  Meds ordered this encounter  Medications  . oxyCODONE-acetaminophen (PERCOCET/ROXICET) 5-325 MG tablet    Sig: Take 1-2 tablets by mouth every 8 (eight) hours as needed for severe pain.    Dispense:  60 tablet    Refill:  0    Imaging: No results found.  PMFS History: Patient Active Problem List   Diagnosis Date Noted  . 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 Anemia   . Asthma   . Back pain   . Carpal tunnel syndrome   . Diabetes mellitus   . Fibroid tumor   . Ganglion cyst   . GERD (gastroesophageal reflux disease)   . Headache(784.0)   . Heart murmur    saw dr. Einar Gip (only sees if needed, not  seen since 2011)  . Hypertension    dr Einar Gip     . Hypothyroidism   . Shortness of breath   . Shoulder pain   . Tendonitis    left shoulder biceps tendonitis, acromioclavicular osteoarthritis  . Tennis elbow   . Thyroid disease     Family History  Problem Relation Age of Onset  . Heart disease Mother   . Heart disease Father   . Breast cancer Sister   . Heart disease Sister   . Heart disease Brother     Past Surgical History:  Procedure Laterality Date  . ABDOMINAL HYSTERECTOMY    . ANTERIOR LAT LUMBAR FUSION N/A 01/03/2013   Procedure: X LIF (Extreme Lateral Interbody Fusion) L3-L4,  (1 LEVEL) ;  Surgeon: Melina Schools, MD;  Location: [facility];  Service: Orthopedics;  Laterality: N/A;  . BACK SURGERY     x5  . CHOLECYSTECTOMY    . KNEE ARTHROSCOPY Right 04/2014  . LEFT HEART CATHETERIZATION WITH CORONARY ANGIOGRAM N/A 04/27/2011   Procedure: LEFT HEART CATHETERIZATION WITH CORONARY ANGIOGRAM;  Surgeon: Laverda Page, MD;  [facility];  Service: Cardiovascular;  Laterality: N/A;  .  pelvic bone removed    . ROTATOR CUFF REPAIR    . SHOULDER ARTHROSCOPY WITH SUBACROMIAL DECOMPRESSION, ROTATOR CUFF REPAIR AND BICEP TENDON REPAIR Left 02/03/2016   Procedure: SHOULDER ARTHROSCOPY WITH SUBACROMIAL DECOMPRESSION, DEBRIDEMENT, BICEPS TENODESIS, DISTAL CLAVICLE EXCISION.;  Surgeon: Meredith Pel, MD;  Location: [place];  Service: Orthopedics;  Laterality: Left;  . THYROID SURGERY     LUMP REMOVED  . TUBAL LIGATION     Social History   Occupational History  . Not on file.   Social History Main Topics  . Smoking status: Never Smoker  . Smokeless tobacco: Never Used  . Alcohol use No  . Drug use: No  . Sexual activity: Not on file

## 2016-03-16 DIAGNOSIS — J45998 Other asthma: Secondary | ICD-10-CM | POA: Diagnosis not present

## 2016-03-25 DIAGNOSIS — N182 Chronic kidney disease, stage 2 (mild): Secondary | ICD-10-CM | POA: Diagnosis not present

## 2016-03-25 DIAGNOSIS — L0292 Furuncle, unspecified: Secondary | ICD-10-CM | POA: Diagnosis not present

## 2016-03-25 DIAGNOSIS — I129 Hypertensive chronic kidney disease with stage 1 through stage 4 chronic kidney disease, or unspecified chronic kidney disease: Secondary | ICD-10-CM | POA: Diagnosis not present

## 2016-03-25 DIAGNOSIS — E1165 Type 2 diabetes mellitus with hyperglycemia: Secondary | ICD-10-CM | POA: Diagnosis not present

## 2016-04-05 ENCOUNTER — Encounter (INDEPENDENT_AMBULATORY_CARE_PROVIDER_SITE_OTHER): Payer: Self-pay | Admitting: Orthopedic Surgery

## 2016-04-05 ENCOUNTER — Ambulatory Visit (INDEPENDENT_AMBULATORY_CARE_PROVIDER_SITE_OTHER): Payer: Medicare Other | Admitting: Orthopedic Surgery

## 2016-04-05 DIAGNOSIS — M19019 Primary osteoarthritis, unspecified shoulder: Secondary | ICD-10-CM

## 2016-04-05 MED ORDER — OXYCODONE-ACETAMINOPHEN 5-325 MG PO TABS: 1.0000 | ORAL_TABLET | Freq: Every day | ORAL | 0 refills | Status: DC | PRN

## 2016-04-05 NOTE — Progress Notes (Signed)
Post-Op Visit Note   Patient: Brianna Mitchell           Date of Birth: 1955-08-09           MRN: [id number] Visit Date: 04/05/2016 PCP: Maximino Greenland, MD   Assessment & Plan:  Chief Complaint:  Chief Complaint  Patient presents with  . Left Shoulder - Routine Post Op   Visit Diagnoses:  1. Shoulder arthritis     Plan: Patient is 61 year old 2 months out left shoulder arthroscopy debridement acids tenodesis distal clavicle excision in general patient is still having some pain but is pleasant was a month ago.  On exam she has improving range of motion.  Rotator cuff strength is good.  Does hurt her to go forward flexion above the 100.  I would taper her down oxycodone 1 per day she is currently not in pain management.  Okay to lift less than or equal to 5 pounds.  Continue with home exercises.  8 week return for final checkout and release  Follow-Up Instructions: No Follow-up on file.   Orders:  No orders of the defined types were placed in this encounter.  No orders of the defined types were placed in this encounter.   Imaging: No results found.  PMFS History: Patient Active Problem List   Diagnosis Date Noted  . 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 Anemia   . Asthma   . Back pain   . Carpal tunnel syndrome   . Diabetes mellitus   . Fibroid tumor   . Ganglion cyst   . GERD (gastroesophageal reflux disease)   . Headache(784.0)   . Heart murmur    saw dr. Einar Gip (only sees if needed, not seen since 2011)  . Hypertension    dr Einar Gip     . Hypothyroidism   . Shortness of breath   . Shoulder pain   . Tendonitis    left shoulder biceps tendonitis, acromioclavicular osteoarthritis  . Tennis elbow   . Thyroid disease     Family History  Problem Relation Age of Onset  . Heart disease Mother   . Heart disease Father   . Breast cancer Sister   . Heart disease Sister   . Heart disease Brother     Past Surgical History:    Procedure Laterality Date  . ABDOMINAL HYSTERECTOMY    . ANTERIOR LAT LUMBAR FUSION N/A 01/03/2013   Procedure: X LIF (Extreme Lateral Interbody Fusion) L3-L4,  (1 LEVEL) ;  Surgeon: Melina Schools, MD;  Location: [place];  Service: Orthopedics;  Laterality: N/A;  . BACK SURGERY     x5  . CHOLECYSTECTOMY    . KNEE ARTHROSCOPY Right 04/2014  . LEFT HEART CATHETERIZATION WITH CORONARY ANGIOGRAM N/A 04/27/2011   Procedure: LEFT HEART CATHETERIZATION WITH CORONARY ANGIOGRAM;  Surgeon: Laverda Page, MD;  Location: [facility] CATH LAB;  Service: Cardiovascular;  Laterality: N/A;  . pelvic bone removed    . ROTATOR CUFF REPAIR    . SHOULDER ARTHROSCOPY WITH SUBACROMIAL DECOMPRESSION, ROTATOR CUFF REPAIR AND BICEP TENDON REPAIR Left 02/03/2016   Procedure: SHOULDER ARTHROSCOPY WITH SUBACROMIAL DECOMPRESSION, DEBRIDEMENT, BICEPS TENODESIS, DISTAL CLAVICLE EXCISION.;  Surgeon: Meredith Pel, MD;  Location: [facility];  Service: Orthopedics;  Laterality: Left;  . THYROID SURGERY     LUMP REMOVED  . TUBAL LIGATION     Social History   Occupational History  . Not on file.   Social History Main  Topics  . Smoking status: Never Smoker  . Smokeless tobacco: Never Used  . Alcohol use No  . Drug use: No  . Sexual activity: Not on file

## 2016-04-14 DIAGNOSIS — J45998 Other asthma: Secondary | ICD-10-CM | POA: Diagnosis not present

## 2016-04-16 DIAGNOSIS — I158 Other secondary hypertension: Secondary | ICD-10-CM | POA: Diagnosis not present

## 2016-04-16 DIAGNOSIS — J4520 Mild intermittent asthma, uncomplicated: Secondary | ICD-10-CM | POA: Diagnosis not present

## 2016-04-16 DIAGNOSIS — Z79891 Long term (current) use of opiate analgesic: Secondary | ICD-10-CM | POA: Diagnosis not present

## 2016-05-12 ENCOUNTER — Other Ambulatory Visit (INDEPENDENT_AMBULATORY_CARE_PROVIDER_SITE_OTHER): Payer: Self-pay | Admitting: Orthopedic Surgery

## 2016-05-12 NOTE — Telephone Encounter (Signed)
Rx request 

## 2016-05-13 ENCOUNTER — Telehealth (INDEPENDENT_AMBULATORY_CARE_PROVIDER_SITE_OTHER): Payer: Self-pay

## 2016-05-13 DIAGNOSIS — Z79891 Long term (current) use of opiate analgesic: Secondary | ICD-10-CM | POA: Diagnosis not present

## 2016-05-13 DIAGNOSIS — I158 Other secondary hypertension: Secondary | ICD-10-CM | POA: Diagnosis not present

## 2016-05-13 DIAGNOSIS — J4520 Mild intermittent asthma, uncomplicated: Secondary | ICD-10-CM | POA: Diagnosis not present

## 2016-05-13 DIAGNOSIS — M4326 Fusion of spine, lumbar region: Secondary | ICD-10-CM | POA: Diagnosis not present

## 2016-05-13 NOTE — Telephone Encounter (Signed)
Dr. Ricke Hey calling stating patient is taking Opana ER from him and pain medication from [place] as well, he would like a call back [phone]

## 2016-05-13 NOTE — Telephone Encounter (Signed)
I called and advised we were not aware that she was receiving medication from them. I apologized for the confusion it could have caused.

## 2016-05-27 DIAGNOSIS — J45998 Other asthma: Secondary | ICD-10-CM | POA: Diagnosis not present

## 2016-05-31 ENCOUNTER — Ambulatory Visit (INDEPENDENT_AMBULATORY_CARE_PROVIDER_SITE_OTHER): Payer: Medicare Other | Admitting: Orthopedic Surgery

## 2016-06-02 ENCOUNTER — Ambulatory Visit (INDEPENDENT_AMBULATORY_CARE_PROVIDER_SITE_OTHER): Payer: Medicare Other | Admitting: Orthopedic Surgery

## 2016-06-09 ENCOUNTER — Ambulatory Visit (INDEPENDENT_AMBULATORY_CARE_PROVIDER_SITE_OTHER): Payer: Medicare Other

## 2016-06-09 ENCOUNTER — Ambulatory Visit (INDEPENDENT_AMBULATORY_CARE_PROVIDER_SITE_OTHER): Payer: Medicare Other | Admitting: Orthopedic Surgery

## 2016-06-09 ENCOUNTER — Encounter (INDEPENDENT_AMBULATORY_CARE_PROVIDER_SITE_OTHER): Payer: Self-pay

## 2016-06-09 ENCOUNTER — Encounter (INDEPENDENT_AMBULATORY_CARE_PROVIDER_SITE_OTHER): Payer: Self-pay | Admitting: Orthopedic Surgery

## 2016-06-09 DIAGNOSIS — M542 Cervicalgia: Secondary | ICD-10-CM | POA: Diagnosis not present

## 2016-06-09 DIAGNOSIS — M5412 Radiculopathy, cervical region: Secondary | ICD-10-CM | POA: Diagnosis not present

## 2016-06-09 NOTE — Progress Notes (Signed)
Office Visit Note   Patient: Brianna Mitchell           Date of Birth: March 08, 1955           MRN: [id number] Visit Date: 06/09/2016 Requested by: Glendale Chard, MD [address] [place], [place] [address] PCP: Maximino Greenland, MD  Subjective: Chief Complaint  Patient presents with  . Neck - Pain  . Left Shoulder - Pain    HPI: Patient is 61 year old female with left arm pain coming from the neck.  She had shoulder surgery in December.  Doing reasonably well from that episode has some occasional pain around the rotator cuff.  This was a rotator cuff tear re-repair.  States that nothing really giving her relief.  She reports radicular pain running down the arm below the elbow into the anger tips.  She also describes left-sided neck pain which is new.  She's been doing her own physical therapy.  She is in pain management.              ROS: All systems reviewed are negative as they relate to the chief complaint within the history of present illness.  Patient denies  fevers or chills.   Assessment & Plan: Visit Diagnoses:  1. Neck pain   2. Cervical radiculopathy     Plan: Impression is left sided radiculopathy.  Her shoulder looks pretty reasonable.  Rotator cuff strength feels good and there is not much in the way of course grinding or crepitus with active or passive range of motion.  She does have some referred pain with range of motion but she is also having some radicular pain extending below the elbow crease.  I think in this time based on the findings on her cervical spine plain radiographs and the radicular nature of this pain that MRI scan of the C-spine is indicated.  I like also send her to Dr. Ernestina Patches to evaluate for left-sided carpal tunnel syndrome.  She's had this on the right than it has been released.  Follow-Up Instructions: No Follow-up on file.   Orders:  Orders Placed This Encounter  Procedures  . XR Cervical Spine 2 or 3 views   No orders of the defined  types were placed in this encounter.     Procedures: No procedures performed   Clinical Data: No additional findings.  Objective: Vital Signs: There were no vitals taken for this visit.  Physical Exam:   Constitutional: Patient appears well-developed HEENT:  Head: Normocephalic Eyes:EOM are normal Neck: Normal range of motion Cardiovascular: Normal rate Pulmonary/chest: Effort normal Neurologic: Patient is alert Skin: Skin is warm Psychiatric: Patient has normal mood and affect    Ortho Exam: Orthopedic exam demonstrates fairly reasonable cervical spine range of motion but with some reproduction of symptoms to palpation of the trapezius.  EPL FPL interosseous 5+ out of 5 bilateral.  Radial pulse intact bilateral.  Pretty reasonable deltoid biceps and triceps strength in both arms.  Rotator cuff strength also reasonable without course grinding or crepitus on the left-hand side.  No skin color difference or temperature difference left versus right arm.  Specialty Comments:  No specialty comments available.  Imaging: No results found.   PMFS History: Patient Active Problem List   Diagnosis Date Noted  . 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 Anemia   . Asthma   . Back pain   . Carpal tunnel syndrome   . Diabetes mellitus   .  Fibroid tumor   . Ganglion cyst   . GERD (gastroesophageal reflux disease)   . Headache(784.0)   . Heart murmur    saw dr. Einar Gip (only sees if needed, not seen since 2011)  . Hypertension    dr Einar Gip     . Hypothyroidism   . Shortness of breath   . Shoulder pain   . Tendonitis    left shoulder biceps tendonitis, acromioclavicular osteoarthritis  . Tennis elbow   . Thyroid disease     Family History  Problem Relation Age of Onset  . Heart disease Mother   . Heart disease Father   . Breast cancer Sister   . Heart disease Sister   . Heart disease Brother     Past Surgical History:  Procedure  Laterality Date  . ABDOMINAL HYSTERECTOMY    . ANTERIOR LAT LUMBAR FUSION N/A 01/03/2013   Procedure: X LIF (Extreme Lateral Interbody Fusion) L3-L4,  (1 LEVEL) ;  Surgeon: Melina Schools, MD;  Location: [facility];  Service: Orthopedics;  Laterality: N/A;  . BACK SURGERY     x5  . CHOLECYSTECTOMY    . KNEE ARTHROSCOPY Right 04/2014  . LEFT HEART CATHETERIZATION WITH CORONARY ANGIOGRAM N/A 04/27/2011   Procedure: LEFT HEART CATHETERIZATION WITH CORONARY ANGIOGRAM;  Surgeon: Laverda Page, MD;  Location[facility] LAB;  Service: Cardiovascular;  Laterality: N/A;  . pelvic bone removed    . ROTATOR CUFF REPAIR    . SHOULDER ARTHROSCOPY WITH SUBACROMIAL DECOMPRESSION, ROTATOR CUFF REPAIR AND BICEP TENDON REPAIR Left 02/03/2016   Procedure: SHOULDER ARTHROSCOPY WITH SUBACROMIAL DECOMPRESSION, DEBRIDEMENT, BICEPS TENODESIS, DISTAL CLAVICLE EXCISION.;  Surgeon: Meredith Pel, MD;  Location: [facility];  Service: Orthopedics;  Laterality: Left;  . THYROID SURGERY     LUMP REMOVED  . TUBAL LIGATION     Social History   Occupational History  . Not on file.   Social History Main Topics  . Smoking status: Never Smoker  . Smokeless tobacco: Never Used  . Alcohol use No  . Drug use: No  . Sexual activity: Not on file

## 2016-06-21 ENCOUNTER — Ambulatory Visit
Admission: RE | Admit: 2016-06-21 | Discharge: 2016-06-21 | Disposition: A | Discharge status: Home / Self Care | Payer: Medicare Other | Source: Ambulatory Visit | Attending: Orthopedic Surgery | Admitting: Orthopedic Surgery

## 2016-06-21 DIAGNOSIS — M5412 Radiculopathy, cervical region: Secondary | ICD-10-CM

## 2016-06-21 DIAGNOSIS — M50322 Other cervical disc degeneration at C5-C6 level: Secondary | ICD-10-CM | POA: Diagnosis not present

## 2016-06-21 DIAGNOSIS — M542 Cervicalgia: Secondary | ICD-10-CM

## 2016-06-24 DIAGNOSIS — J45998 Other asthma: Secondary | ICD-10-CM | POA: Diagnosis not present

## 2016-06-28 ENCOUNTER — Other Ambulatory Visit: Payer: Self-pay | Admitting: Endocrinology

## 2016-06-28 DIAGNOSIS — E7800 Pure hypercholesterolemia, unspecified: Secondary | ICD-10-CM | POA: Diagnosis not present

## 2016-06-28 DIAGNOSIS — I10 Essential (primary) hypertension: Secondary | ICD-10-CM | POA: Diagnosis not present

## 2016-06-28 DIAGNOSIS — E041 Nontoxic single thyroid nodule: Secondary | ICD-10-CM

## 2016-06-28 DIAGNOSIS — E039 Hypothyroidism, unspecified: Secondary | ICD-10-CM | POA: Diagnosis not present

## 2016-06-28 DIAGNOSIS — E1165 Type 2 diabetes mellitus with hyperglycemia: Secondary | ICD-10-CM | POA: Diagnosis not present

## 2016-06-28 DIAGNOSIS — R5383 Other fatigue: Secondary | ICD-10-CM | POA: Diagnosis not present

## 2016-06-30 ENCOUNTER — Ambulatory Visit (INDEPENDENT_AMBULATORY_CARE_PROVIDER_SITE_OTHER): Payer: Medicare Other | Admitting: Orthopedic Surgery

## 2016-06-30 ENCOUNTER — Encounter (INDEPENDENT_AMBULATORY_CARE_PROVIDER_SITE_OTHER): Payer: Self-pay | Admitting: Orthopedic Surgery

## 2016-06-30 DIAGNOSIS — M542 Cervicalgia: Secondary | ICD-10-CM

## 2016-06-30 NOTE — Progress Notes (Signed)
Office Visit Note   Patient: Brianna Mitchell           Date of Birth: April 02, 1955           MRN: [id number] Visit Date: 06/30/2016 Requested by: Glendale Chard, [address] [place], [place] [address] PCP: Glendale Chard, MD  Subjective: Chief Complaint  Patient presents with  . Neck - Follow-up    HPI: Brianna Mitchell is a patient with left arm pain and neck pain who presents for follow-up of MRI scan.  MRI scan is reviewed and does show some small disc bulges at C3-4 and C5-6 but only on the right-hand side.  She is not really having much in way of symptoms on the right but she is having symptoms radiating from the neck down to the hand on the left-hand side.  She is on oxycodone for her back.  She is seeing Dr. Ernestina Patches for evaluation on May 25.    In general she's doing reasonably well.  Patient had surgery last year for distal clavicle excision and biceps tenodesis.  Rotator cuff intact at that time.  Surgery date 02/03/2016             ROS: All systems reviewed are negative as they relate to the chief complaint within the history of present illness.  Patient denies  fevers or chills.   Assessment & Plan: Visit Diagnoses:  1. Cervicalgia     Plan: Impression is neck pain radiating into the arm on the left-hand side with no real significant cooperating findings on MRI scan.  I think is possible she may have a radiculopathy which could be helped with injection.  We'll see if Dr. Rockie Neighbours at that assessment.  In regards to her shoulder hurt cuff was intact at the time of surgery biceps tenodesis distal clavicle excision was done.  With all of her symptoms radiating below the elbow and into the neck don't think that any further shoulder workup or intervention is indicated.  I'll see her back in 6 months for final check.  Follow-Up Instructions: Return in about 6 months (around 12/31/2016).   Orders:  No orders of the defined types were placed in this encounter.  No orders of the  defined types were placed in this encounter.     Procedures: No procedures performed   Clinical Data: No additional findings.  Objective: Vital Signs: There were no vitals taken for this visit.  Physical Exam:   Constitutional: Patient appears well-developed HEENT:  Head: Normocephalic Eyes:EOM are normal Neck: Normal range of motion Cardiovascular: Normal rate Pulmonary/chest: Effort normal Neurologic: Patient is alert Skin: Skin is warm Psychiatric: Patient has normal mood and affect    Ortho Exam: Left shoulder exam demonstrates good strength no course grinding or crepitus with active or passive range of motion of the shoulder palpable radial pulse though definite paresthesias C5 T1.  Neck range of motion somewhat tender with flexion extension rotation to the left and right.  Specialty Comments:  No specialty comments available.  Imaging: No results found.   PMFS History: Patient Active Problem List   Diagnosis Date Noted  . 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 Anemia   . Asthma   . Back pain   . Carpal tunnel syndrome   . Diabetes mellitus   . Fibroid tumor   . Ganglion cyst   . GERD (gastroesophageal reflux disease)   . Headache(784.0)   . Heart murmur  saw dr. Einar Gip (only sees if needed, not seen since 2011)  . Hypertension    dr Einar Gip     . Hypothyroidism   . Shortness of breath   . Shoulder pain   . Tendonitis    left shoulder biceps tendonitis, acromioclavicular osteoarthritis  . Tennis elbow   . Thyroid disease     Family History  Problem Relation Age of Onset  . Heart disease Mother   . Heart disease Father   . Breast cancer Sister   . Heart disease Sister   . Heart disease Brother     Past Surgical History:  Procedure Laterality Date  . ABDOMINAL HYSTERECTOMY    . ANTERIOR LAT LUMBAR FUSION N/A 01/03/2013   Procedure: X LIF (Extreme Lateral Interbody Fusion) L3-L4,  (1 LEVEL) ;  Surgeon:  Melina Schools, MD;  Location: [facility];  Service: Orthopedics;  Laterality: N/A;  . BACK SURGERY     x5  . CHOLECYSTECTOMY    . KNEE ARTHROSCOPY Right 04/2014  . LEFT HEART CATHETERIZATION WITH CORONARY ANGIOGRAM N/A 04/27/2011   Procedure: LEFT HEART CATHETERIZATION WITH CORONARY ANGIOGRAM;  Surgeon: Laverda Page, MD;  Location: [facility] CATH LAB;  Service: Cardiovascular;  Laterality: N/A;  . pelvic bone removed    . ROTATOR CUFF REPAIR    . SHOULDER ARTHROSCOPY WITH SUBACROMIAL DECOMPRESSION, ROTATOR CUFF REPAIR AND BICEP TENDON REPAIR Left 02/03/2016   Procedure: SHOULDER ARTHROSCOPY WITH SUBACROMIAL DECOMPRESSION, DEBRIDEMENT, BICEPS TENODESIS, DISTAL CLAVICLE EXCISION.;  Surgeon: Meredith Pel, MD;  Location: [facility];  Service: Orthopedics;  Laterality: Left;  . THYROID SURGERY     LUMP REMOVED  . TUBAL LIGATION     Social History   Occupational History  . Not on file.   Social History Main Topics  . Smoking status: Never Smoker  . Smokeless tobacco: Never Used  . Alcohol use No  . Drug use: No  . Sexual activity: Not on file

## 2016-07-01 DIAGNOSIS — G4734 Idiopathic sleep related nonobstructive alveolar hypoventilation: Secondary | ICD-10-CM | POA: Diagnosis not present

## 2016-07-01 DIAGNOSIS — R52 Pain, unspecified: Secondary | ICD-10-CM | POA: Diagnosis not present

## 2016-07-05 ENCOUNTER — Other Ambulatory Visit: Payer: Self-pay | Admitting: Diagnostic Neuroimaging

## 2016-07-07 ENCOUNTER — Ambulatory Visit
Admission: RE | Admit: 2016-07-07 | Discharge: 2016-07-07 | Disposition: A | Discharge status: Home / Self Care | Payer: Medicare Other | Source: Ambulatory Visit | Attending: Endocrinology | Admitting: Endocrinology

## 2016-07-07 DIAGNOSIS — E041 Nontoxic single thyroid nodule: Secondary | ICD-10-CM

## 2016-07-08 DIAGNOSIS — E7800 Pure hypercholesterolemia, unspecified: Secondary | ICD-10-CM | POA: Diagnosis not present

## 2016-07-08 DIAGNOSIS — J4520 Mild intermittent asthma, uncomplicated: Secondary | ICD-10-CM | POA: Diagnosis not present

## 2016-07-08 DIAGNOSIS — M4326 Fusion of spine, lumbar region: Secondary | ICD-10-CM | POA: Diagnosis not present

## 2016-07-08 DIAGNOSIS — I158 Other secondary hypertension: Secondary | ICD-10-CM | POA: Diagnosis not present

## 2016-07-09 ENCOUNTER — Ambulatory Visit (INDEPENDENT_AMBULATORY_CARE_PROVIDER_SITE_OTHER): Payer: Medicare Other | Admitting: Physical Medicine and Rehabilitation

## 2016-07-09 ENCOUNTER — Encounter (INDEPENDENT_AMBULATORY_CARE_PROVIDER_SITE_OTHER): Payer: Self-pay | Admitting: Physical Medicine and Rehabilitation

## 2016-07-09 DIAGNOSIS — R202 Paresthesia of skin: Secondary | ICD-10-CM

## 2016-07-09 NOTE — Procedures (Signed)
EMG & NCV Findings: All nerve conduction studies (as indicated in the following tables) were within normal limits.    All examined muscles (as indicated in the following table) showed no evidence of electrical instability.    Impression: Essentially NORMAL electrodiagnostic study of the left upper limb.  There is no significant electrodiagnostic evidence of nerve entrapment, brachial plexopathy or cervical radiculopathy.    As you know, purely sensory or demyelinating radiculopathies and chemical radiculitis may not be detected with this particular electrodiagnostic study.  This electrodiagnostic study cannot rule out small fiber polyneuropathy and dysesthesias from central pain sensitization syndromes such as fibromyalgia.  Recommendations: 1.  Follow-up with referring physician. 2.  Continue current management of symptoms.     Nerve Conduction Studies Anti Sensory Summary Table   Stim Site NR Peak (ms) Norm Peak (ms) P-T Amp (V) Norm P-T Amp Site1 Site2 Delta-P (ms) Dist (cm) Vel (m/s) Norm Vel (m/s)  Left Median Acr Palm Anti Sensory (2nd Digit)  32.8C  Wrist    3.4 <3.6 23.1 >10 Wrist Palm 1.8 0.0    Palm    1.6 <2.0 15.7         Left Radial Anti Sensory (Base 1st Digit)  32.8C  Wrist    2.3 <3.1 16.0  Wrist Base 1st Digit 2.3 0.0    Left Ulnar Anti Sensory (5th Digit)  33C  Wrist    3.5 <3.7 16.8 >15.0 Wrist 5th Digit 3.5 14.0 40 >38   Motor Summary Table   Stim Site NR Onset (ms) Norm Onset (ms) O-P Amp (mV) Norm O-P Amp Site1 Site2 Delta-0 (ms) Dist (cm) Vel (m/s) Norm Vel (m/s)  Left Median Motor (Abd Poll Brev)  32.7C  Wrist    3.2 <4.2 10.6 >5 Elbow Wrist 4.1 22.5 55 >50  Elbow    7.3  10.3         Left Ulnar Motor (Abd Dig Min)  32.8C  Wrist    3.2 <4.2 8.7 >3 B Elbow Wrist 3.8 22.0 58 >53  B Elbow    7.0  7.5  A Elbow B Elbow 1.6 9.0 56 >53  A Elbow    8.6  8.0          EMG   Side Muscle Nerve Root Ins Act Fibs Psw Amp Dur Poly Recrt Int Fraser Din Comment   Left Abd Poll Brev Median C8-T1 Nml Nml Nml Nml Nml 0 Nml Nml   Left 1stDorInt Ulnar C8-T1 Nml Nml Nml Nml Nml 0 Nml Nml   Left PronatorTeres Median C6-7 Nml Nml Nml Nml Nml 0 Nml Nml   Left Biceps Musculocut C5-6 Nml Nml Nml Nml Nml 0 Nml Nml   Left Deltoid Axillary C5-6 Nml Nml Nml Nml Nml 0 Nml Nml     Nerve Conduction Studies Anti Sensory Left/Right Comparison   Stim Site L Lat (ms) R Lat (ms) L-R Lat (ms) L Amp (V) R Amp (V) L-R Amp (%) Site1 Site2 L Vel (m/s) R Vel (m/s) L-R Vel (m/s)  Median Acr Palm Anti Sensory (2nd Digit)  32.8C  Wrist 3.4   23.1   Wrist Palm     Palm 1.6   15.7         Radial Anti Sensory (Base 1st Digit)  32.8C  Wrist 2.3   16.0   Wrist Base 1st Digit     Ulnar Anti Sensory (5th Digit)  33C  Wrist 3.5   16.8   Wrist 5th Digit 40  Motor Left/Right Comparison   Stim Site L Lat (ms) R Lat (ms) L-R Lat (ms) L Amp (mV) R Amp (mV) L-R Amp (%) Site1 Site2 L Vel (m/s) R Vel (m/s) L-R Vel (m/s)  Median Motor (Abd Poll Brev)  32.7C  Wrist 3.2   10.6   Elbow Wrist 55    Elbow 7.3   10.3         Ulnar Motor (Abd Dig Min)  32.8C  Wrist 3.2   8.7   B Elbow Wrist 58    B Elbow 7.0   7.5   A Elbow B Elbow 56    A Elbow 8.6   8.0

## 2016-07-09 NOTE — Progress Notes (Deleted)
Left arm and neck pain. Constant. Pain down arm into hand. Numbness and tingling into fingers. Difficulty raising arm. Right hand dominant.

## 2016-07-09 NOTE — Progress Notes (Signed)
Brianna Mitchell - 61 y.o. female MRN [id number]  Date of birth: 11/04/1955  Office Visit Note: Visit Date: 07/09/2016 PCP: Glendale Chard, MD Referred by: Glendale Chard, MD  Subjective: No chief complaint on file.  HPI: Mrs. Fargnoli is a 61 year old right-hand dominant female who reports chronic constant recalcitrant left neck shoulder and arm pain with numbness and tingling in a nondermatomal fashion in the whole hand in all fingers. She reports that the symptoms started after she had shoulder surgery. She has difficulty raising her arm with increased pain. Other than increasing the pain with raising the arm the symptoms are constant 24/7. She asked me today if I know any good pain management physicians. I did give her a list of pain management physicians in the area. She probably should talk to Dr. Marlou Sa or primary care physician concerning referral. She has had prior right carpal tunnel release but not on the left. She has had 5 lumbar surgeries but no cervical surgery. Her case is complicated by thyroid disease and diabetes.    ROS Otherwise per HPI.  Assessment & Plan: Visit Diagnoses:  1. Paresthesia of skin     Plan: No additional findings.  Impression: Essentially NORMAL electrodiagnostic study of the left upper limb.  There is no significant electrodiagnostic evidence of nerve entrapment, brachial plexopathy or cervical radiculopathy.    As you know, purely sensory or demyelinating radiculopathies and chemical radiculitis may not be detected with this particular electrodiagnostic study.  This electrodiagnostic study cannot rule out small fiber polyneuropathy and dysesthesias from central pain sensitization syndromes such as fibromyalgia.  Recommendations: 1.  Follow-up with referring physician. 2.  Continue current management of symptoms.   Meds & Orders: No orders of the defined types were placed in this encounter.   Orders Placed This Encounter  Procedures  . NCV with EMG  (electromyography)    Follow-up: Return for Dr. Marlou Sa.   Procedures: No procedures performed  EMG & NCV Findings: All nerve conduction studies (as indicated in the following tables) were within normal limits.    All examined muscles (as indicated in the following table) showed no evidence of electrical instability.    Impression: Essentially NORMAL electrodiagnostic study of the left upper limb.  There is no significant electrodiagnostic evidence of nerve entrapment, brachial plexopathy or cervical radiculopathy.    As you know, purely sensory or demyelinating radiculopathies and chemical radiculitis may not be detected with this particular electrodiagnostic study.  This electrodiagnostic study cannot rule out small fiber polyneuropathy and dysesthesias from central pain sensitization syndromes such as fibromyalgia.  Recommendations: 1.  Follow-up with referring physician. 2.  Continue current management of symptoms.     Nerve Conduction Studies Anti Sensory Summary Table   Stim Site NR Peak (ms) Norm Peak (ms) P-T Amp (V) Norm P-T Amp Site1 Site2 Delta-P (ms) Dist (cm) Vel (m/s) Norm Vel (m/s)  Left Median Acr Palm Anti Sensory (2nd Digit)  32.8C  Wrist    3.4 <3.6 23.1 >10 Wrist Palm 1.8 0.0    Palm    1.6 <2.0 15.7         Left Radial Anti Sensory (Base 1st Digit)  32.8C  Wrist    2.3 <3.1 16.0  Wrist Base 1st Digit 2.3 0.0    Left Ulnar Anti Sensory (5th Digit)  33C  Wrist    3.5 <3.7 16.8 >15.0 Wrist 5th Digit 3.5 14.0 40 >38   Motor Summary Table   Stim Site NR Onset (ms) Norm  Onset (ms) O-P Amp (mV) Norm O-P Amp Site1 Site2 Delta-0 (ms) Dist (cm) Vel (m/s) Norm Vel (m/s)  Left Median Motor (Abd Poll Brev)  32.7C  Wrist    3.2 <4.2 10.6 >5 Elbow Wrist 4.1 22.5 55 >50  Elbow    7.3  10.3         Left Ulnar Motor (Abd Dig Min)  32.8C  Wrist    3.2 <4.2 8.7 >3 B Elbow Wrist 3.8 22.0 58 >53  B Elbow    7.0  7.5  A Elbow B Elbow 1.6 9.0 56 >53  A Elbow    8.6  8.0           EMG   Side Muscle Nerve Root Ins Act Fibs Psw Amp Dur Poly Recrt Int Fraser Din Comment  Left Abd Poll Brev Median C8-T1 Nml Nml Nml Nml Nml 0 Nml Nml   Left 1stDorInt Ulnar C8-T1 Nml Nml Nml Nml Nml 0 Nml Nml   Left PronatorTeres Median C6-7 Nml Nml Nml Nml Nml 0 Nml Nml   Left Biceps Musculocut C5-6 Nml Nml Nml Nml Nml 0 Nml Nml   Left Deltoid Axillary C5-6 Nml Nml Nml Nml Nml 0 Nml Nml     Nerve Conduction Studies Anti Sensory Left/Right Comparison   Stim Site L Lat (ms) R Lat (ms) L-R Lat (ms) L Amp (V) R Amp (V) L-R Amp (%) Site1 Site2 L Vel (m/s) R Vel (m/s) L-R Vel (m/s)  Median Acr Palm Anti Sensory (2nd Digit)  32.8C  Wrist 3.4   23.1   Wrist Palm     Palm 1.6   15.7         Radial Anti Sensory (Base 1st Digit)  32.8C  Wrist 2.3   16.0   Wrist Base 1st Digit     Ulnar Anti Sensory (5th Digit)  33C  Wrist 3.5   16.8   Wrist 5th Digit 40     Motor Left/Right Comparison   Stim Site L Lat (ms) R Lat (ms) L-R Lat (ms) L Amp (mV) R Amp (mV) L-R Amp (%) Site1 Site2 L Vel (m/s) R Vel (m/s) L-R Vel (m/s)  Median Motor (Abd Poll Brev)  32.7C  Wrist 3.2   10.6   Elbow Wrist 55    Elbow 7.3   10.3         Ulnar Motor (Abd Dig Min)  32.8C  Wrist 3.2   8.7   B Elbow Wrist 58    B Elbow 7.0   7.5   A Elbow B Elbow 56    A Elbow 8.6   8.0               Clinical History: No specialty comments available.  She reports that she has never smoked. She has never used smokeless tobacco.   Recent Labs  01/27/16 0927  HGBA1C 8.8*    Objective:  VS:  HT:    WT:   BMI:     BP:   HR: bpm  TEMP: ( )  RESP:  Physical Exam  Musculoskeletal:  Inspection reveals well-healed right carpal tunnel release surgical scar as well as extremely long fingernails are all the digits bilaterally but no atrophy of the bilateral APB or FDI or hand intrinsics. There is no swelling, color changes, allodynia or dystrophic changes. There is 5 out of 5 strength in the bilateral wrist extension,  finger abduction and long finger flexion. There is a negative Tinel's test at  the bilateral wrist and elbow. There is a negative Hoffmann's test bilaterally.    Ortho Exam Imaging: No results found.  Past Medical/Family/Surgical/Social History: Medications & Allergies reviewed per EMR Patient Active Problem List   Diagnosis Date Noted  . 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 Anemia   . Asthma   . Back pain   . Carpal tunnel syndrome   . Diabetes mellitus   . Fibroid tumor   . Ganglion cyst   . GERD (gastroesophageal reflux disease)   . Headache(784.0)   . Heart murmur    saw dr. Einar Gip (only sees if needed, not seen since 2011)  . Hypertension    dr Einar Gip     . Hypothyroidism   . Shortness of breath   . Shoulder pain   . Tendonitis    left shoulder biceps tendonitis, acromioclavicular osteoarthritis  . Tennis elbow   . Thyroid disease    Family History  Problem Relation Age of Onset  . Heart disease Mother   . Heart disease Father   . Breast cancer Sister   . Heart disease Sister   . Heart disease Brother    Past Surgical History:  Procedure Laterality Date  . ABDOMINAL HYSTERECTOMY    . ANTERIOR LAT LUMBAR FUSION N/A 01/03/2013   Procedure: X LIF (Extreme Lateral Interbody Fusion) L3-L4,  (1 LEVEL) ;  Surgeon: Melina Schools, MD;  Location: [facility];  Service: Orthopedics;  Laterality: N/A;  . BACK SURGERY     x5  . CHOLECYSTECTOMY    . KNEE ARTHROSCOPY Right 04/2014  . LEFT HEART CATHETERIZATION WITH CORONARY ANGIOGRAM N/A 04/27/2011   Procedure: LEFT HEART CATHETERIZATION WITH CORONARY ANGIOGRAM;  Surgeon: Laverda Page, MD;  Location: [facility] CATH LAB;  Service: Cardiovascular;  Laterality: N/A;  . pelvic bone removed    . ROTATOR CUFF REPAIR    . SHOULDER ARTHROSCOPY WITH SUBACROMIAL DECOMPRESSION, ROTATOR CUFF REPAIR AND BICEP TENDON REPAIR Left 02/03/2016   Procedure: SHOULDER ARTHROSCOPY WITH SUBACROMIAL DECOMPRESSION,  DEBRIDEMENT, BICEPS TENODESIS, DISTAL CLAVICLE EXCISION.;  Surgeon: Meredith Pel, MD;  Location: [facility];  Service: Orthopedics;  Laterality: Left;  . THYROID SURGERY     LUMP REMOVED  . TUBAL LIGATION     Social History   Occupational History  . Not on file.   Social History Main Topics  . Smoking status: Never Smoker  . Smokeless tobacco: Never Used  . Alcohol use No  . Drug use: No  . Sexual activity: Not on file

## 2016-07-16 DIAGNOSIS — J45998 Other asthma: Secondary | ICD-10-CM | POA: Diagnosis not present

## 2016-07-20 DIAGNOSIS — L401 Generalized pustular psoriasis: Secondary | ICD-10-CM | POA: Diagnosis not present

## 2016-07-26 DIAGNOSIS — Z1231 Encounter for screening mammogram for malignant neoplasm of breast: Secondary | ICD-10-CM | POA: Diagnosis not present

## 2016-07-26 DIAGNOSIS — N750 Cyst of Bartholin's gland: Secondary | ICD-10-CM | POA: Diagnosis not present

## 2016-07-26 DIAGNOSIS — E1165 Type 2 diabetes mellitus with hyperglycemia: Secondary | ICD-10-CM | POA: Diagnosis not present

## 2016-07-26 DIAGNOSIS — R21 Rash and other nonspecific skin eruption: Secondary | ICD-10-CM | POA: Diagnosis not present

## 2016-07-28 ENCOUNTER — Ambulatory Visit: Payer: Medicare Other | Admitting: Diagnostic Neuroimaging

## 2016-08-04 DIAGNOSIS — Z79891 Long term (current) use of opiate analgesic: Secondary | ICD-10-CM | POA: Diagnosis not present

## 2016-08-09 ENCOUNTER — Other Ambulatory Visit: Payer: Self-pay | Admitting: Diagnostic Neuroimaging

## 2016-08-10 DIAGNOSIS — J45998 Other asthma: Secondary | ICD-10-CM | POA: Diagnosis not present

## 2016-08-31 ENCOUNTER — Other Ambulatory Visit (INDEPENDENT_AMBULATORY_CARE_PROVIDER_SITE_OTHER): Payer: Self-pay | Admitting: Orthopedic Surgery

## 2016-08-31 NOTE — Telephone Encounter (Signed)
Ok to rf? 

## 2016-09-01 NOTE — Telephone Encounter (Signed)
y

## 2016-10-23 ENCOUNTER — Other Ambulatory Visit (INDEPENDENT_AMBULATORY_CARE_PROVIDER_SITE_OTHER): Payer: Self-pay | Admitting: Orthopedic Surgery

## 2016-10-25 NOTE — Telephone Encounter (Signed)
y

## 2016-10-25 NOTE — Telephone Encounter (Signed)
Ok to rF? 

## 2016-11-03 ENCOUNTER — Other Ambulatory Visit (INDEPENDENT_AMBULATORY_CARE_PROVIDER_SITE_OTHER): Payer: Self-pay | Admitting: Orthopedic Surgery

## 2016-11-03 NOTE — Telephone Encounter (Signed)
y

## 2016-11-03 NOTE — Telephone Encounter (Signed)
Ok to rf? 

## 2016-11-15 ENCOUNTER — Ambulatory Visit: Payer: Medicare Other | Admitting: Diagnostic Neuroimaging

## 2016-11-16 ENCOUNTER — Encounter: Payer: Self-pay | Admitting: Diagnostic Neuroimaging

## 2016-11-16 ENCOUNTER — Ambulatory Visit (INDEPENDENT_AMBULATORY_CARE_PROVIDER_SITE_OTHER): Payer: Medicare Other | Admitting: Diagnostic Neuroimaging

## 2016-11-16 VITALS — BP 147/90 | HR 72 | Wt 163.8 lb

## 2016-11-16 DIAGNOSIS — G43019 Migraine without aura, intractable, without status migrainosus: Secondary | ICD-10-CM

## 2016-11-16 MED ORDER — AMITRIPTYLINE HCL 25 MG PO TABS: 25.0000 mg | ORAL_TABLET | Freq: Every day | ORAL | 6 refills | Status: DC

## 2016-11-16 MED ORDER — TOPIRAMATE 100 MG PO TABS: 100.0000 mg | ORAL_TABLET | Freq: Two times a day (BID) | ORAL | 4 refills | Status: DC

## 2016-11-16 MED ORDER — RIZATRIPTAN BENZOATE 10 MG PO TBDP: 10.0000 mg | ORAL_TABLET | ORAL | 11 refills | Status: DC | PRN

## 2016-11-16 NOTE — Patient Instructions (Signed)
-   start amitriptyline 25mg  at bedtime (to prevent headaches)  - continue topiramate 100mg  twice a day (to prevent headaches)  - use tylenol + rizatriptan as needed for headache rescue

## 2016-11-16 NOTE — Progress Notes (Signed)
[facility]  PATIENT: Brianna Mitchell DOB: 1955-06-14  REFERRING CLINICIAN: Odem HISTORY FROM: patient  REASON FOR VISIT: follow up   HISTORICAL  CHIEF COMPLAINT:  Chief Complaint  Patient presents with  . Migraine    rm 6, "increased headaches, migraines recently, medicine not working"  . Follow-up    HISTORY OF PRESENT ILLNESS:   UPDATE (11/16/16, VRP): Since last visit, doing poorly. More back pain, headaches, insomnia. Tolerating topiramate and rizatriptan.   UPDATE 07/29/15: Since last visit, symptoms stable. Daily HA + intermittent headaches. Patient had to cancel sleep study due to family issues.   UPDATE 01/01/15: Since last visit, still with daily HA. Also with 5 severe migraines per week.  PRIOR HPI (03/12/14): 61 year old ambidextrous female here for evaluation of headaches. Since age 48 years old patient has had intermittent global, severe throbbing headaches, with blurred vision, irritability, nausea and photophobia and phonophobia. Around 2008 patient was evaluated by [facility] center, dx'd with migraine, and prescribed topiramate 25 mg twice a day. Patient then followed up with PCP who increased this to 50 mg twice a day for past 2-3 yrs. Patient continues to have multiple low-grade headaches per day. She has 3 severe migraine headaches per week. She typically tries to calm herself, relax, sometimes takes half tablet of generic Excedrin Migraine tablet. She reports allergy to aspirin and this medication has aspirin in it. She reports some itching sensation when she takes it. Patient reports remote sleep study testing more than 10 years ago but does not know the results. She is not on CPAP therapy. She has significant insomnia which she feels is related to her chronic back pain. She has snoring, obesity, HTN  and daytime sleepiness. No specific other triggering factors for headache.   REVIEW OF SYSTEMS: Full 14 system review of systems  performed and negative except: headache constipation light sens.   ALLERGIES: Allergies  Allergen Reactions  . Aspirin Swelling  . Sulfa Antibiotics Swelling  . Demerol Rash    HOME MEDICATIONS: Outpatient Medications Prior to Visit  Medication Sig Dispense Refill  . albuterol (PROVENTIL HFA;VENTOLIN HFA) 108 (90 BASE) MCG/ACT inhaler Inhale 2 puffs into the lungs every 6 (six) hours as needed. For shortness of breath    . albuterol (PROVENTIL) (2.5 MG/3ML) 0.083% nebulizer solution Take 2.5 mg by nebulization every 6 (six) hours as needed. For shortness of breath    . beclomethasone (QVAR) 80 MCG/ACT inhaler Inhale 1 puff into the lungs daily with lunch.    . dexlansoprazole (DEXILANT) 60 MG capsule Take 60 mg by mouth daily.    Marland Kitchen EVZIO 0.4 MG/0.4ML SOAJ   0  . FLECTOR 1.3 % PTCH APPLY 1 Patch every 12 HOURS as needed. THEN OFF 12 HOURS, cut to 2 square inch size 30 patch 1  . HUMALOG KWIKPEN 100 UNIT/ML KiwkPen Inject 5-14 Units into the skin 3 (three) times daily. Based on sliding scale    . HUMULIN N KWIKPEN 100 UNIT/ML Kiwkpen Inject 30-40 Units into the skin 2 (two) times daily. 30 units in the morning and 40 units in the evening    . levothyroxine (SYNTHROID, LEVOTHROID) 75 MCG tablet Take 75 mcg by mouth daily.    . ondansetron (ZOFRAN-ODT) 4 MG disintegrating tablet Take 1 tablet (4 mg total) by mouth every 8 (eight) hours as needed for nausea or vomiting. 50 tablet 0  . ONE TOUCH ULTRA TEST test strip     . oxyCODONE-acetaminophen (PERCOCET/ROXICET) 5-325 MG tablet  Take 1 tablet by mouth daily as needed for severe pain. 60 tablet 0  . polyethylene glycol (MIRALAX / GLYCOLAX) packet Take 17 g by mouth daily. (Patient taking differently: Take 17 g by mouth daily as needed for mild constipation. ) 24 each 0  . rizatriptan (MAXALT-MLT) 10 MG disintegrating tablet Take 1 tablet (10 mg total) by mouth as needed for migraine. May repeat in 2 hours if needed 9 tablet 11  . rosuvastatin  (CRESTOR) 40 MG tablet Take 40 mg by mouth daily.    Marland Kitchen topiramate (TOPAMAX) 100 MG tablet Take 1 tablet (100 mg total) by mouth 2 (two) times daily. 60 tablet 11  . oxyCODONE-acetaminophen (PERCOCET/ROXICET) 5-325 MG tablet Take 1-2 tablets by mouth every 8 (eight) hours as needed for severe pain. 60 tablet 0   No facility-administered medications prior to visit.     PAST MEDICAL HISTORY: Past Medical History:  Diagnosis Date  . Anemia   . Asthma   . Back pain   . Carpal tunnel syndrome   . Diabetes mellitus   . Fibroid tumor   . Ganglion cyst   . GERD (gastroesophageal reflux disease)   . Headache(784.0)   . Heart murmur    saw dr. Einar Gip (only sees if needed, not seen since 2011)  . Hypertension    dr Einar Gip     . Hypothyroidism   . Shortness of breath   . Shoulder pain   . Tendonitis    left shoulder biceps tendonitis, acromioclavicular osteoarthritis  . Tennis elbow   . Thyroid disease     PAST SURGICAL HISTORY: Past Surgical History:  Procedure Laterality Date  . ABDOMINAL HYSTERECTOMY    . ANTERIOR LAT LUMBAR FUSION N/A 01/03/2013   Procedure: X LIF (Extreme Lateral Interbody Fusion) L3-L4,  (1 LEVEL) ;  Surgeon: Melina Schools, MD;  Location: [facility];  Service: Orthopedics;  Laterality: N/A;  . BACK SURGERY     x5  . CHOLECYSTECTOMY    . KNEE ARTHROSCOPY Right 04/2014  . LEFT HEART CATHETERIZATION WITH CORONARY ANGIOGRAM N/A 04/27/2011   Procedure: LEFT HEART CATHETERIZATION WITH CORONARY ANGIOGRAM;  Surgeon: Laverda Page, MD;  Location: [facility] CATH LAB;  Service: Cardiovascular;  Laterality: N/A;  . pelvic bone removed    . ROTATOR CUFF REPAIR    . SHOULDER ARTHROSCOPY WITH SUBACROMIAL DECOMPRESSION, ROTATOR CUFF REPAIR AND BICEP TENDON REPAIR Left 02/03/2016   Procedure: SHOULDER ARTHROSCOPY WITH SUBACROMIAL DECOMPRESSION, DEBRIDEMENT, BICEPS TENODESIS, DISTAL CLAVICLE EXCISION.;  Surgeon: Meredith Pel, MD;  Location: [place] [facility];  Service: Orthopedics;  Laterality:  Left;  . THYROID SURGERY     LUMP REMOVED  . TUBAL LIGATION      FAMILY HISTORY: Family History  Problem Relation Age of Onset  . Heart disease Mother   . Heart disease Father   . Breast cancer Sister   . Heart disease Sister   . Heart disease Brother     SOCIAL HISTORY:  Social History   Social History  . Marital status: Divorced    Spouse name: N/A  . Number of children: N/A  . Years of education: N/A   Occupational History  . Not on file.   Social History Main Topics  . Smoking status: Never Smoker  . Smokeless tobacco: Never Used  . Alcohol use No  . Drug use: No  . Sexual activity: Not on file   Other Topics Concern  . Not on file   Social History Narrative  . No narrative  on file     PHYSICAL EXAM  Vitals:   11/16/16 1227  BP: (!) 147/90  Pulse: 72  Weight: 163 lb 12.8 oz (74.3 kg)    Body mass index is 35.45 kg/m.  No exam data present  No flowsheet data found.  GENERAL EXAM: Patient is in no distress; well developed, nourished and groomed; neck is supple  CARDIOVASCULAR: Regular rate and rhythm, no murmurs, no carotid bruits  NEUROLOGIC: MENTAL STATUS: awake, alert, language fluent, comprehension intact, naming intact, fund of knowledge appropriate CRANIAL NERVE: pupils equal and reactive to light, visual fields full to confrontation, extraocular muscles intact, no nystagmus, facial sensation and strength symmetric, hearing intact, palate elevates symmetrically, uvula midline, shoulder shrug symmetric, tongue midline. MOTOR: normal bulk and tone, full strength in the BUE, BLE SENSORY: normal and symmetric to light touch, temperature, vibration  COORDINATION: finger-nose-finger, fine finger movements normal REFLEXES: deep tendon reflexes present and symmetric GAIT/STATION: narrow based gait; able to walk tandem; romberg is negative    DIAGNOSTIC DATA (LABS, IMAGING, TESTING) - I reviewed patient records, labs, notes, testing and  imaging myself where available.  Lab Results  Component Value Date   WBC 9.7 01/27/2016   HGB 11.4 (L) 01/27/2016   HCT 35.7 (L) 01/27/2016   MCV 85.8 01/27/2016   PLT 318 01/27/2016      Component Value Date/Time   NA 141 01/27/2016 0927   K 3.2 (L) 01/27/2016 0927   CL 110 01/27/2016 0927   CO2 23 01/27/2016 0927   GLUCOSE 34 (LL) 01/27/2016 0927   BUN 13 01/27/2016 0927   CREATININE 1.16 (H) 01/27/2016 0927   CALCIUM 9.6 01/27/2016 0927   PROT 7.1 12/02/2009 1756   ALBUMIN 3.8 12/02/2009 1756   AST 31 12/02/2009 1756   ALT 38 (H) 12/02/2009 1756   ALKPHOS 82 12/02/2009 1756   BILITOT 0.6 12/02/2009 1756   GFRNONAA 50 (L) 01/27/2016 0927   GFRAA 58 (L) 01/27/2016 0927   No results found for: CHOL, HDL, LDLCALC, LDLDIRECT, TRIG, CHOLHDL Lab Results  Component Value Date   HGBA1C 8.8 (H) 01/27/2016   No results found for: VITAMINB12 Lab Results  Component Value Date   TSH 1.577 12/02/2009    03/12/14 CT head - Negative for bleed or other acute intracranial process.    ASSESSMENT AND PLAN  61 y.o. year old female here with migraine without aura since age 7 years old. Headaches are aggravated by insomnia, chronic back pain, family stress issues.   Dx:  Intractable migraine without aura and without status migrainosus    PLAN:  I spent 15 minutes of face to face time with patient. Greater than 50% of time was spent in counseling and coordination of care with patient. In summary we discussed: - start amitriptyline 25mg  at bedtime - continue TPX 100mg  BID + tylenol prn + rizatriptan as needed - encouraged patient to improve nutrition and sleep pattern  Meds ordered this encounter  Medications  . topiramate (TOPAMAX) 100 MG tablet    Sig: Take 1 tablet (100 mg total) by mouth 2 (two) times daily.    Dispense:  180 tablet    Refill:  4  . rizatriptan (MAXALT-MLT) 10 MG disintegrating tablet    Sig: Take 1 tablet (10 mg total) by mouth as needed for migraine.  May repeat in 2 hours if needed    Dispense:  9 tablet    Refill:  11  . amitriptyline (ELAVIL) 25 MG tablet    Sig:  Take 1 tablet (25 mg total) by mouth at bedtime.    Dispense:  30 tablet    Refill:  6   Return in about 6 months (around 05/17/2017) for with NP.    Penni Bombard, MD 81/09/2991, 71:69 PM Certified in Neurology, Neurophysiology and [facility] [address], [place], [place] [address] [phone]

## 2016-12-23 ENCOUNTER — Telehealth: Payer: Self-pay | Admitting: *Deleted

## 2016-12-23 NOTE — Telephone Encounter (Signed)
Received request for flector patch which originally prescribed by Marcene Duos.  We see pt for migraines.  This a new request.  Did you want to prescribe?

## 2016-12-23 NOTE — Telephone Encounter (Signed)
No. Pt should get from PCP. -VRP

## 2016-12-23 NOTE — Telephone Encounter (Signed)
Noted .  Will send back form to pharmacy.  Thanks. Done. Received fax confirmation.

## 2016-12-31 ENCOUNTER — Ambulatory Visit (INDEPENDENT_AMBULATORY_CARE_PROVIDER_SITE_OTHER): Payer: 59 | Admitting: Orthopedic Surgery

## 2016-12-31 ENCOUNTER — Encounter (INDEPENDENT_AMBULATORY_CARE_PROVIDER_SITE_OTHER): Payer: Self-pay | Admitting: Orthopedic Surgery

## 2016-12-31 DIAGNOSIS — M25512 Pain in left shoulder: Secondary | ICD-10-CM | POA: Diagnosis not present

## 2016-12-31 DIAGNOSIS — G8929 Other chronic pain: Secondary | ICD-10-CM

## 2016-12-31 MED ORDER — DICLOFENAC EPOLAMINE 1.3 % TD PTCH: MEDICATED_PATCH | TRANSDERMAL | 1 refills | Status: DC

## 2016-12-31 NOTE — Progress Notes (Signed)
Office Visit Note   Patient: Brianna Mitchell           Date of Birth: 03/25/1955           MRN: [id number] Visit Date: 12/31/2016 Requested by: Glendale Chard, [address] [place], [place] [address] PCP: Glendale Chard, MD  Subjective: Chief Complaint  Patient presents with  . Left Shoulder - Pain    HPI: The LAD is a 61 year old female with left shoulder pain.  A year ago she had left shoulder arthroscopy with biceps tenodesis and subacromial decompression.  She had previous clavicle excision done.  Reports pain in the shoulder.  Not gotten much better.  She is on Opana and oxycodone.  MRI scan reviewed from 2017 on the left shoulder looks like cuff repair is intact there is some motion artifact.  She's not having any fevers or chills.  She is using Aspercreme.  She is also using the Flector patch on the left shoulder.              ROS: All systems reviewed are negative as they relate to the chief complaint within the history of present illness.  Patient denies  fevers or chills.   Assessment & Plan: Visit Diagnoses:  1. Chronic left shoulder pain     Plan: Impression is left shoulder pain.  She is diabetic.  She is in the process of having GERD diabetes glucose managed in tract.  I think we should come back and try an injection in 2 weeks.  Continue with other nonoperative modalities.  No further surgical intervention indicated at this time.  We did change her dressing on the right arm to help with the issue she was having with her blood glucose management system  Follow-Up Instructions: Return in about 2 weeks (around 01/14/2017).   Orders:  No orders of the defined types were placed in this encounter.  Meds ordered this encounter  Medications  . diclofenac (FLECTOR) 1.3 % PTCH    Sig: APPLY 1 Patch every 12 HOURS as needed. THEN OFF 12 HOURS, cut to 2 square inch size    Dispense:  30 patch    Refill:  1      Procedures: No procedures  performed   Clinical Data: No additional findings.  Objective: Vital Signs: There were no vitals taken for this visit.  Physical Exam:   Constitutional: Patient appears well-developed HEENT:  Head: Normocephalic Eyes:EOM are normal Neck: Normal range of motion Cardiovascular: Normal rate Pulmonary/chest: Effort normal Neurologic: Patient is alert Skin: Skin is warm Psychiatric: Patient has normal mood and affect    Ortho Exam: Orthopedic exam demonstrates full active and passive range of motion of the cervical spine.  On the left shoulder there is no course grinding or crepitus with active or passive range of motion of that left arm.  Does have some pain above 100 of forward flexion and abduction.  Rotator cuff strength is intact.  Surgical incisions well-healed.  No warmth around the left shoulder.  Specialty Comments:  No specialty comments available.  Imaging: No results found.   PMFS History: Patient Active Problem List   Diagnosis Date Noted  . 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 Anemia   . Asthma   . Back pain   . Carpal tunnel syndrome   . Diabetes mellitus   . Fibroid tumor   . Ganglion cyst   . GERD (gastroesophageal reflux disease)   .  Headache(784.0)   . Heart murmur    saw dr. Einar Gip (only sees if needed, not seen since 2011)  . Hypertension    dr Einar Gip     . Hypothyroidism   . Shortness of breath   . Shoulder pain   . Tendonitis    left shoulder biceps tendonitis, acromioclavicular osteoarthritis  . Tennis elbow   . Thyroid disease     Family History  Problem Relation Age of Onset  . Heart disease Mother   . Heart disease Father   . Breast cancer Sister   . Heart disease Sister   . Heart disease Brother     Past Surgical History:  Procedure Laterality Date  . ABDOMINAL HYSTERECTOMY    . BACK SURGERY     x5  . CHOLECYSTECTOMY    . KNEE ARTHROSCOPY Right 04/2014  . LEFT HEART CATHETERIZATION  WITH CORONARY ANGIOGRAM N/A 04/27/2011   Performed by Laverda Page, MD at [facility] CATH LAB  . pelvic bone removed    . POSTERIOR SPINAL FUSION  L3-L4 (1 LEVEL) N/A 01/03/2013   Performed by Melina Schools, MD at [facility]  . ROTATOR CUFF REPAIR    . SHOULDER ARTHROSCOPY WITH SUBACROMIAL DECOMPRESSION, DEBRIDEMENT, BICEPS TENODESIS, DISTAL CLAVICLE EXCISION. Left 02/03/2016   Performed by Meredith Pel, MD at [facility]  . TUBAL LIGATION    . X LIF (Extreme Lateral Interbody Fusion) L3-L4,  (1 LEVEL) N/A 01/03/2013   Performed by Melina Schools, MD at [facility] History  . Not on file  Tobacco Use  . Smoking status: Never Smoker  . Smokeless tobacco: Never Used  Substance and Sexual Activity  . Alcohol use: No    Alcohol/week: 1.0 oz    Types: 2 Standard drinks or equivalent per week  . Drug use: No  . Sexual activity: Not on file

## 2017-01-13 ENCOUNTER — Ambulatory Visit (INDEPENDENT_AMBULATORY_CARE_PROVIDER_SITE_OTHER): Payer: 59 | Admitting: Orthopedic Surgery

## 2017-01-19 ENCOUNTER — Encounter (INDEPENDENT_AMBULATORY_CARE_PROVIDER_SITE_OTHER): Payer: Self-pay | Admitting: Orthopedic Surgery

## 2017-01-19 ENCOUNTER — Ambulatory Visit (INDEPENDENT_AMBULATORY_CARE_PROVIDER_SITE_OTHER): Payer: 59 | Admitting: Orthopedic Surgery

## 2017-01-19 DIAGNOSIS — M19019 Primary osteoarthritis, unspecified shoulder: Secondary | ICD-10-CM

## 2017-01-19 DIAGNOSIS — M19012 Primary osteoarthritis, left shoulder: Secondary | ICD-10-CM

## 2017-01-19 MED ORDER — LIDOCAINE HCL 1 % IJ SOLN
5.0000 mL | INTRAMUSCULAR | Status: AC | PRN
Start: 2017-01-19 — End: 2017-01-19
  Administered 2017-01-19: 5 mL

## 2017-01-19 MED ORDER — METHYLPREDNISOLONE ACETATE 40 MG/ML IJ SUSP
40.0000 mg | INTRAMUSCULAR | Status: AC | PRN
Start: 2017-01-19 — End: 2017-01-19
  Administered 2017-01-19: 40 mg via INTRA_ARTICULAR

## 2017-01-19 MED ORDER — BUPIVACAINE HCL 0.5 % IJ SOLN
9.0000 mL | INTRAMUSCULAR | Status: AC | PRN
  Administered 2017-01-19: 9 mL via INTRA_ARTICULAR

## 2017-01-19 NOTE — Progress Notes (Signed)
Office Visit Note   Patient: Brianna Mitchell           Date of Birth: August 30, 1955           MRN: [id number] Visit Date: 01/19/2017 Requested by: Glendale Chard, [address] [place], [place] [address] PCP: Glendale Chard, MD  Subjective: Chief Complaint  Patient presents with  . Left Shoulder - Follow-up    HPI: Brianna Mitchell is a 61 year old female with left shoulder pain.  She has had 2 prior surgeries.  She is having some recurrent pain in the deltoid region and request injection today.  Her diabetes is been under good control.  She denies any weakness.              ROS: All systems reviewed are negative as they relate to the chief complaint within the history of present illness.  Patient denies  fevers or chills.   Assessment & Plan: Visit Diagnoses:  1. Shoulder arthritis     Plan: Impression is left shoulder pain with some component of bursitis is present.  Subacromial injection performed.  Taking Opana and pain management.  I will see her back as needed this for months  Follow-Up Instructions: Return if symptoms worsen or fail to improve.   Orders:  No orders of the defined types were placed in this encounter.  No orders of the defined types were placed in this encounter.     Procedures: Large Joint Inj: L subacromial bursa on 01/19/2017 8:21 PM Indications: diagnostic evaluation and pain Details: 18 G 1.5 in needle, posterior approach  Arthrogram: No  Medications: 9 mL bupivacaine 0.5 %; 40 mg methylPREDNISolone acetate 40 MG/ML; 5 mL lidocaine 1 % Outcome: tolerated well, no immediate complications Procedure, treatment alternatives, risks and benefits explained, specific risks discussed. Consent was given by the patient. Immediately prior to procedure a time out was called to verify the correct patient, procedure, equipment, support staff and site/side marked as required. Patient was prepped and draped in the usual sterile fashion.       Clinical  Data: No additional findings.  Objective: Vital Signs: There were no vitals taken for this visit.  Physical Exam:   Constitutional: Patient appears well-developed HEENT:  Head: Normocephalic Eyes:EOM are normal Neck: Normal range of motion Cardiovascular: Normal rate Pulmonary/chest: Effort normal Neurologic: Patient is alert Skin: Skin is warm Psychiatric: Patient has normal mood and affect    Ortho Exam: Orthopedic exam demonstrates some pain with internal/external rotation above 90 degrees of abduction on the left but reasonable rotator cuff strength is present.  Well-healed surgical incisions noted.  Rotator cuff strength is good.  Cervical spine range of motion is good.  No other masses lymphadenopathy or skin changes noted on shoulder  Specialty Comments:  No specialty comments available.  Imaging: No results found.   PMFS History: Patient Active Problem List   Diagnosis Date Noted  . 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 Anemia   . Asthma   . Back pain   . Carpal tunnel syndrome   . Diabetes mellitus   . Fibroid tumor   . Ganglion cyst   . GERD (gastroesophageal reflux disease)   . Headache(784.0)   . Heart murmur    saw dr. Einar Gip (only sees if needed, not seen since 2011)  . Hypertension    dr Einar Gip     . Hypothyroidism   . Shortness of breath   . Shoulder pain   .  Tendonitis    left shoulder biceps tendonitis, acromioclavicular osteoarthritis  . Tennis elbow   . Thyroid disease     Family History  Problem Relation Age of Onset  . Heart disease Mother   . Heart disease Father   . Breast cancer Sister   . Heart disease Sister   . Heart disease Brother     Past Surgical History:  Procedure Laterality Date  . ABDOMINAL HYSTERECTOMY    . ANTERIOR LAT LUMBAR FUSION N/A 01/03/2013   Procedure: X LIF (Extreme Lateral Interbody Fusion) L3-L4,  (1 LEVEL) ;  Surgeon: Melina Schools, MD;  Location: [facility];  Service:  Orthopedics;  Laterality: N/A;  . BACK SURGERY     x5  . CHOLECYSTECTOMY    . KNEE ARTHROSCOPY Right 04/2014  . LEFT HEART CATHETERIZATION WITH CORONARY ANGIOGRAM N/A 04/27/2011   Procedure: LEFT HEART CATHETERIZATION WITH CORONARY ANGIOGRAM;  Surgeon: Laverda Page, MD;  Location[facility] CATH LAB;  Service: Cardiovascular;  Laterality: N/A;  . pelvic bone removed    . ROTATOR CUFF REPAIR    . SHOULDER ARTHROSCOPY WITH SUBACROMIAL DECOMPRESSION, ROTATOR CUFF REPAIR AND BICEP TENDON REPAIR Left 02/03/2016   Procedure: SHOULDER ARTHROSCOPY WITH SUBACROMIAL DECOMPRESSION, DEBRIDEMENT, BICEPS TENODESIS, DISTAL CLAVICLE EXCISION.;  Surgeon: Meredith Pel, MD;  Location: [facility];  Service: Orthopedics;  Laterality: Left;  . THYROID SURGERY     LUMP REMOVED  . TUBAL LIGATION     Social History   Occupational History  . Not on file  Tobacco Use  . Smoking status: Never Smoker  . Smokeless tobacco: Never Used  Substance and Sexual Activity  . Alcohol use: No    Alcohol/week: 1.0 oz    Types: 2 Standard drinks or equivalent per week  . Drug use: No  . Sexual activity: Not on file

## 2017-01-28 ENCOUNTER — Other Ambulatory Visit (INDEPENDENT_AMBULATORY_CARE_PROVIDER_SITE_OTHER): Payer: Self-pay | Admitting: Diagnostic Neuroimaging

## 2017-02-23 ENCOUNTER — Other Ambulatory Visit: Payer: Self-pay | Admitting: Gastroenterology

## 2017-02-23 ENCOUNTER — Ambulatory Visit
Admission: RE | Admit: 2017-02-23 | Discharge: 2017-02-23 | Disposition: A | Discharge status: Home / Self Care | Payer: 59 | Source: Ambulatory Visit | Attending: Gastroenterology | Admitting: Gastroenterology

## 2017-02-23 DIAGNOSIS — R1033 Periumbilical pain: Secondary | ICD-10-CM

## 2017-02-23 MED ORDER — IOPAMIDOL (ISOVUE-300) INJECTION 61%
100.0000 mL | Freq: Once | INTRAVENOUS | Status: DC | PRN
Start: 2017-02-23 — End: 2017-02-24

## 2017-03-01 ENCOUNTER — Other Ambulatory Visit (INDEPENDENT_AMBULATORY_CARE_PROVIDER_SITE_OTHER): Payer: Self-pay | Admitting: Orthopedic Surgery

## 2017-03-01 NOTE — Telephone Encounter (Signed)
Ok to rf? 

## 2017-03-01 NOTE — Telephone Encounter (Signed)
y

## 2017-03-08 ENCOUNTER — Telehealth (INDEPENDENT_AMBULATORY_CARE_PROVIDER_SITE_OTHER): Payer: Self-pay | Admitting: Orthopedic Surgery

## 2017-03-08 NOTE — Telephone Encounter (Signed)
[facility] called saying they need prior authorization for Flector for patient.

## 2017-03-09 NOTE — Telephone Encounter (Signed)
I called. Was advised denied by insurance.

## 2017-04-18 DIAGNOSIS — R1033 Periumbilical pain: Secondary | ICD-10-CM | POA: Diagnosis not present

## 2017-04-18 DIAGNOSIS — K219 Gastro-esophageal reflux disease without esophagitis: Secondary | ICD-10-CM | POA: Diagnosis not present

## 2017-04-18 HISTORY — PX: COLONOSCOPY: SHX174

## 2017-04-22 DIAGNOSIS — G894 Chronic pain syndrome: Secondary | ICD-10-CM | POA: Diagnosis not present

## 2017-04-22 DIAGNOSIS — M5417 Radiculopathy, lumbosacral region: Secondary | ICD-10-CM | POA: Diagnosis not present

## 2017-04-27 DIAGNOSIS — E7800 Pure hypercholesterolemia, unspecified: Secondary | ICD-10-CM | POA: Diagnosis not present

## 2017-04-27 DIAGNOSIS — I10 Essential (primary) hypertension: Secondary | ICD-10-CM | POA: Diagnosis not present

## 2017-04-27 DIAGNOSIS — E039 Hypothyroidism, unspecified: Secondary | ICD-10-CM | POA: Diagnosis not present

## 2017-04-27 DIAGNOSIS — E1165 Type 2 diabetes mellitus with hyperglycemia: Secondary | ICD-10-CM | POA: Diagnosis not present

## 2017-04-28 DIAGNOSIS — E1165 Type 2 diabetes mellitus with hyperglycemia: Secondary | ICD-10-CM | POA: Diagnosis not present

## 2017-04-28 DIAGNOSIS — G894 Chronic pain syndrome: Secondary | ICD-10-CM | POA: Diagnosis not present

## 2017-04-28 DIAGNOSIS — R809 Proteinuria, unspecified: Secondary | ICD-10-CM | POA: Diagnosis not present

## 2017-04-28 DIAGNOSIS — M545 Low back pain: Secondary | ICD-10-CM | POA: Diagnosis not present

## 2017-04-28 DIAGNOSIS — H269 Unspecified cataract: Secondary | ICD-10-CM | POA: Diagnosis not present

## 2017-04-29 ENCOUNTER — Other Ambulatory Visit: Payer: Self-pay | Admitting: General Surgery

## 2017-04-29 DIAGNOSIS — R1010 Upper abdominal pain, unspecified: Secondary | ICD-10-CM

## 2017-04-29 DIAGNOSIS — J45998 Other asthma: Secondary | ICD-10-CM | POA: Diagnosis not present

## 2017-05-02 ENCOUNTER — Other Ambulatory Visit: Payer: Self-pay | Admitting: Diagnostic Neuroimaging

## 2017-05-04 ENCOUNTER — Ambulatory Visit
Admission: RE | Admit: 2017-05-04 | Discharge: 2017-05-04 | Disposition: A | Discharge status: Home / Self Care | Payer: Medicare Other | Source: Ambulatory Visit | Attending: General Surgery | Admitting: General Surgery

## 2017-05-04 DIAGNOSIS — R1010 Upper abdominal pain, unspecified: Secondary | ICD-10-CM

## 2017-05-04 DIAGNOSIS — K449 Diaphragmatic hernia without obstruction or gangrene: Secondary | ICD-10-CM | POA: Diagnosis not present

## 2017-05-05 DIAGNOSIS — E785 Hyperlipidemia, unspecified: Secondary | ICD-10-CM | POA: Diagnosis not present

## 2017-05-05 DIAGNOSIS — G43909 Migraine, unspecified, not intractable, without status migrainosus: Secondary | ICD-10-CM | POA: Diagnosis not present

## 2017-05-10 DIAGNOSIS — M5136 Other intervertebral disc degeneration, lumbar region: Secondary | ICD-10-CM | POA: Diagnosis not present

## 2017-05-18 ENCOUNTER — Ambulatory Visit: Payer: Medicare Other | Admitting: Adult Health

## 2017-05-18 ENCOUNTER — Ambulatory Visit: Payer: Self-pay | Admitting: General Surgery

## 2017-05-20 ENCOUNTER — Ambulatory Visit (INDEPENDENT_AMBULATORY_CARE_PROVIDER_SITE_OTHER): Payer: 59 | Admitting: Orthopedic Surgery

## 2017-05-23 DIAGNOSIS — H2511 Age-related nuclear cataract, right eye: Secondary | ICD-10-CM | POA: Diagnosis not present

## 2017-05-23 HISTORY — PX: EYE SURGERY: SHX253

## 2017-05-25 ENCOUNTER — Ambulatory Visit: Payer: Self-pay | Admitting: General Surgery

## 2017-05-25 DIAGNOSIS — J45998 Other asthma: Secondary | ICD-10-CM | POA: Diagnosis not present

## 2017-05-25 NOTE — Patient Instructions (Addendum)
TYLER ROBIDOUX  05/25/2017   Your procedure is scheduled on: 05-30-17  Report to [facility] Main  Entrance   ARRIVE AT 58 AM. Have a seat in the Main Lobby. Please note there is a phone at the [employer]. Please call [phone] on that phone. Someone from Short Stay will come and get you from the Main Lobby and take you to Short Stay.   Call this number if you have problems the morning of surgery [phone]   Remember: NO SOLID FOOD AFTER MIDNIGHT THE NIGHT PRIOR TO SURGERY. NOTHING BY MOUTH EXCEPT CLEAR LIQUIDS UNTIL 3 HOURS PRIOR TO [facility] SURGERY. PLEASE FINISH ENSURE DRINK PER SURGEON ORDER 3 HOURS PRIOR TO SCHEDULED SURGERY TIME WHICH NEEDS TO BE COMPLETED AT ____4:30AM______.      Take these medicines the morning of surgery with A SIP OF WATER: rosuvastatin, dexlansoprazole, levothyroxine, percocet if needed, eye drops if needed, inhalers                                  You may not have any metal on your body including hair pins and              piercings  Do not wear jewelry, make-up, lotions, powders or perfumes, deodorant             Do not wear nail polish.  Do not shave  48 hours prior to surgery.       Do not bring valuables to the hospital. Van.  Contacts, dentures or bridgework may not be worn into surgery.  Leave suitcase in the car. After surgery it may be brought to your room.               Please read over the following fact sheets you were given: _____________________________________________________________________    CLEAR LIQUID DIET   Foods Allowed                                                                     Foods Excluded  Coffee and tea, regular and decaf                             liquids that you cannot  Plain Jell-O in any flavor                                             see through such as: Fruit ices (not with fruit pulp)                                      milk, soups, orange juice  Iced Popsicles  All solid food Carbonated beverages, regular and diet                                    Cranberry, grape and apple juices Sports drinks like Gatorade Lightly seasoned clear broth or consume(fat free) Sugar, honey syrup  Sample Menu Breakfast                                Lunch                                     Supper Cranberry juice                    Beef broth                            Chicken broth Jell-O                                     Grape juice                           Apple juice Coffee or tea                        Jell-O                                      Popsicle                                                Coffee or tea                        Coffee or tea  _____________________________________________________________________  How to Manage Your Diabetes Before and After Surgery  Why is it important to control my blood sugar before and after surgery? . Improving blood sugar levels before and after surgery helps healing and can limit problems. . A way of improving blood sugar control is eating a healthy diet by: o  Eating less sugar and carbohydrates o  Increasing activity/exercise o  Talking with your doctor about reaching your blood sugar goals . High blood sugars (greater than 180 mg/dL) can raise your risk of infections and slow your recovery, so you will need to focus on controlling your diabetes during the weeks before surgery. . Make sure that the doctor who takes care of your diabetes knows about your planned surgery including the date and location.  How do I manage my blood sugar before surgery?  . If you are admitted to the hospital after surgery: o Your blood sugar will be checked by the staff and you will probably be given insulin after surgery (instead of oral diabetes medicines) to make sure you have good blood sugar levels. o The goal for blood  sugar control after surgery is 80-180 mg/dL.   WHAT DO I  DO ABOUT MY DIABETES MEDICATION?   . THE NIGHT BEFORE SURGERY: o  take usual breakfast and lunch doses of HUMALOG INSULIN. DO NOT TAKE THE DINNER/SUPPER DOSE!   o Take usual morning dose of  HUMULIN N INSULIN. ONLY TAKE HALF YOUR BEDTIME DOSE!     . THE MORNING OF SURGERY, .  If your CBG is greater than 220 mg/dL, you may take  of your sliding scale (correction) dose of HUMALOG  Insulin. . ONLY TAKE HALF YOUR MORNING DOSE OF HUMULIN N INSULIN .    Patient Signature:  Date:   Nurse Signature:  Date:   Reviewed and Endorsed by [facility] Patient Education Committee, August 2015           [facility] - Preparing for Surgery Before surgery, you can play an important role.  Because skin is not sterile, your skin needs to be as free of germs as possible.  You can reduce the number of germs on your skin by washing with CHG (chlorahexidine gluconate) soap before surgery.  CHG is an antiseptic cleaner which kills germs and bonds with the skin to continue killing germs even after washing. Please DO NOT use if you have an allergy to CHG or antibacterial soaps.  If your skin becomes reddened/irritated stop using the CHG and inform your nurse when you arrive at Short Stay. Do not shave (including legs and underarms) for at least 48 hours prior to the first CHG shower.  You may shave your face/neck. Please follow these instructions carefully:  1.  Shower with CHG Soap the night before surgery and the  morning of Surgery.  2.  If you choose to wash your hair, wash your hair first as usual with your  normal  shampoo.  3.  After you shampoo, rinse your hair and body thoroughly to remove the  shampoo.                           4.  Use CHG as you would any other liquid soap.  You can apply chg directly  to the skin and wash                       Gently with a scrungie or clean washcloth.  5.  Apply the CHG Soap to your body ONLY FROM THE NECK  DOWN.   Do not use on face/ open                           Wound or open sores. Avoid contact with eyes, ears mouth and genitals (private parts).                       Wash face,  Genitals (private parts) with your normal soap.             6.  Wash thoroughly, paying special attention to the area where your surgery  will be performed.  7.  Thoroughly rinse your body with warm water from the neck down.  8.  DO NOT shower/wash with your normal soap after using and rinsing off  the CHG Soap.                9.  Pat yourself dry with a clean towel.            10.  Wear clean pajamas.  11.  Place clean sheets on your bed the night of your first shower and do not  sleep with pets. Day of Surgery : Do not apply any lotions/deodorants the morning of surgery.  Please wear clean clothes to the hospital/surgery center.  FAILURE TO FOLLOW THESE INSTRUCTIONS MAY RESULT IN THE CANCELLATION OF YOUR SURGERY PATIENT SIGNATURE_________________________________  NURSE SIGNATURE__________________________________  ________________________________________________________________________

## 2017-05-27 ENCOUNTER — Other Ambulatory Visit: Payer: Self-pay

## 2017-05-27 ENCOUNTER — Encounter (HOSPITAL_COMMUNITY): Payer: Self-pay

## 2017-05-27 ENCOUNTER — Encounter (HOSPITAL_COMMUNITY)
Admission: RE | Admit: 2017-05-27 | Discharge: 2017-05-27 | Disposition: A | Discharge status: Home / Self Care | Payer: Medicare Other | Source: Ambulatory Visit | Attending: General Surgery | Admitting: General Surgery

## 2017-05-27 DIAGNOSIS — Z885 Allergy status to narcotic agent status: Secondary | ICD-10-CM | POA: Diagnosis not present

## 2017-05-27 DIAGNOSIS — Z794 Long term (current) use of insulin: Secondary | ICD-10-CM | POA: Diagnosis not present

## 2017-05-27 DIAGNOSIS — R51 Headache: Secondary | ICD-10-CM | POA: Diagnosis not present

## 2017-05-27 DIAGNOSIS — Z981 Arthrodesis status: Secondary | ICD-10-CM | POA: Diagnosis not present

## 2017-05-27 DIAGNOSIS — G8929 Other chronic pain: Secondary | ICD-10-CM | POA: Diagnosis not present

## 2017-05-27 DIAGNOSIS — Z886 Allergy status to analgesic agent status: Secondary | ICD-10-CM | POA: Diagnosis not present

## 2017-05-27 DIAGNOSIS — D649 Anemia, unspecified: Secondary | ICD-10-CM | POA: Diagnosis not present

## 2017-05-27 DIAGNOSIS — E119 Type 2 diabetes mellitus without complications: Secondary | ICD-10-CM | POA: Diagnosis not present

## 2017-05-27 DIAGNOSIS — R1012 Left upper quadrant pain: Secondary | ICD-10-CM | POA: Diagnosis not present

## 2017-05-27 DIAGNOSIS — J45909 Unspecified asthma, uncomplicated: Secondary | ICD-10-CM | POA: Diagnosis not present

## 2017-05-27 DIAGNOSIS — Z803 Family history of malignant neoplasm of breast: Secondary | ICD-10-CM | POA: Diagnosis not present

## 2017-05-27 DIAGNOSIS — Z90710 Acquired absence of both cervix and uterus: Secondary | ICD-10-CM | POA: Diagnosis not present

## 2017-05-27 DIAGNOSIS — Z7951 Long term (current) use of inhaled steroids: Secondary | ICD-10-CM | POA: Diagnosis not present

## 2017-05-27 DIAGNOSIS — Z880 Allergy status to penicillin: Secondary | ICD-10-CM | POA: Diagnosis not present

## 2017-05-27 DIAGNOSIS — Z882 Allergy status to sulfonamides status: Secondary | ICD-10-CM | POA: Diagnosis not present

## 2017-05-27 DIAGNOSIS — M549 Dorsalgia, unspecified: Secondary | ICD-10-CM | POA: Diagnosis not present

## 2017-05-27 DIAGNOSIS — R011 Cardiac murmur, unspecified: Secondary | ICD-10-CM | POA: Diagnosis not present

## 2017-05-27 DIAGNOSIS — Q438 Other specified congenital malformations of intestine: Secondary | ICD-10-CM | POA: Diagnosis not present

## 2017-05-27 DIAGNOSIS — E039 Hypothyroidism, unspecified: Secondary | ICD-10-CM | POA: Diagnosis not present

## 2017-05-27 DIAGNOSIS — I10 Essential (primary) hypertension: Secondary | ICD-10-CM | POA: Diagnosis not present

## 2017-05-27 DIAGNOSIS — Z8249 Family history of ischemic heart disease and other diseases of the circulatory system: Secondary | ICD-10-CM | POA: Diagnosis not present

## 2017-05-27 DIAGNOSIS — K219 Gastro-esophageal reflux disease without esophagitis: Secondary | ICD-10-CM | POA: Diagnosis not present

## 2017-05-27 DIAGNOSIS — Z9049 Acquired absence of other specified parts of digestive tract: Secondary | ICD-10-CM | POA: Diagnosis not present

## 2017-05-27 DIAGNOSIS — Z79899 Other long term (current) drug therapy: Secondary | ICD-10-CM | POA: Diagnosis not present

## 2017-05-27 LAB — COMPREHENSIVE METABOLIC PANEL
ALT: 20 U/L (ref 14–54)
AST: 21 U/L (ref 15–41)
Albumin: 4.1 g/dL (ref 3.5–5.0)
Alkaline Phosphatase: 81 U/L (ref 38–126)
Anion gap: 10 (ref 5–15)
BUN: 20 mg/dL (ref 6–20)
CO2: 22 mmol/L (ref 22–32)
Calcium: 9.3 mg/dL (ref 8.9–10.3)
Chloride: 108 mmol/L (ref 101–111)
Creatinine, Ser: 1.35 mg/dL — ABNORMAL HIGH (ref 0.44–1.00)
GFR calc Af Amer: 48 mL/min — ABNORMAL LOW (ref >60)
GFR calc non Af Amer: 41 mL/min — ABNORMAL LOW (ref >60)
Glucose, Bld: 64 mg/dL — ABNORMAL LOW (ref 65–99)
Potassium: 3.5 mmol/L (ref 3.5–5.1)
Sodium: 140 mmol/L (ref 135–145)
Total Bilirubin: 0.5 mg/dL (ref 0.3–1.2)
Total Protein: 7.5 g/dL (ref 6.5–8.1)

## 2017-05-27 LAB — CBC WITH DIFFERENTIAL/PLATELET
Basophils Absolute: 0 10*3/uL (ref 0.0–0.1)
Basophils Relative: 0 %
Eosinophils Absolute: 0 10*3/uL (ref 0.0–0.7)
Eosinophils Relative: 0 %
HCT: 35.9 % — ABNORMAL LOW (ref 36.0–46.0)
Hemoglobin: 11 g/dL — ABNORMAL LOW (ref 12.0–15.0)
Lymphocytes Relative: 29 %
Lymphs Abs: 2.6 10*3/uL (ref 0.7–4.0)
MCH: 27.4 pg (ref 26.0–34.0)
MCHC: 30.6 g/dL (ref 30.0–36.0)
MCV: 89.3 fL (ref 78.0–100.0)
Monocytes Absolute: 0.6 10*3/uL (ref 0.1–1.0)
Monocytes Relative: 6 %
Neutro Abs: 5.6 10*3/uL (ref 1.7–7.7)
Neutrophils Relative %: 65 %
Platelets: 325 10*3/uL (ref 150–400)
RBC: 4.02 MIL/uL (ref 3.87–5.11)
RDW: 14.1 % (ref 11.5–15.5)
WBC: 8.8 10*3/uL (ref 4.0–10.5)

## 2017-05-27 LAB — GLUCOSE, CAPILLARY: Glucose-Capillary: 141 mg/dL — ABNORMAL HIGH (ref 65–99)

## 2017-05-27 LAB — HEMOGLOBIN A1C
Hgb A1c MFr Bld: 12.6 % — ABNORMAL HIGH (ref 4.8–5.6)
Mean Plasma Glucose: 314.92 mg/dL

## 2017-05-28 LAB — ABO/RH: ABO/RH(D): O POS

## 2017-05-30 ENCOUNTER — Encounter (HOSPITAL_COMMUNITY)
Admission: RE | Disposition: A | Discharge status: Home / Self Care | Payer: Self-pay | Source: Ambulatory Visit | Attending: General Surgery

## 2017-05-30 ENCOUNTER — Inpatient Hospital Stay (HOSPITAL_COMMUNITY): Payer: Medicare Other | Admitting: Anesthesiology

## 2017-05-30 ENCOUNTER — Other Ambulatory Visit: Payer: Self-pay

## 2017-05-30 ENCOUNTER — Ambulatory Visit (HOSPITAL_COMMUNITY)
Admission: RE | Admit: 2017-05-30 | Discharge: 2017-05-30 | Disposition: A | Discharge status: Home / Self Care | Payer: Medicare Other | Source: Ambulatory Visit | Attending: General Surgery | Admitting: General Surgery

## 2017-05-30 ENCOUNTER — Encounter (HOSPITAL_COMMUNITY): Payer: Self-pay | Admitting: *Deleted

## 2017-05-30 DIAGNOSIS — Z794 Long term (current) use of insulin: Secondary | ICD-10-CM | POA: Diagnosis not present

## 2017-05-30 DIAGNOSIS — R51 Headache: Secondary | ICD-10-CM | POA: Diagnosis not present

## 2017-05-30 DIAGNOSIS — D649 Anemia, unspecified: Secondary | ICD-10-CM | POA: Insufficient documentation

## 2017-05-30 DIAGNOSIS — R011 Cardiac murmur, unspecified: Secondary | ICD-10-CM | POA: Diagnosis not present

## 2017-05-30 DIAGNOSIS — Z880 Allergy status to penicillin: Secondary | ICD-10-CM | POA: Diagnosis not present

## 2017-05-30 DIAGNOSIS — R109 Unspecified abdominal pain: Secondary | ICD-10-CM | POA: Diagnosis not present

## 2017-05-30 DIAGNOSIS — Z803 Family history of malignant neoplasm of breast: Secondary | ICD-10-CM | POA: Diagnosis not present

## 2017-05-30 DIAGNOSIS — E119 Type 2 diabetes mellitus without complications: Secondary | ICD-10-CM | POA: Diagnosis not present

## 2017-05-30 DIAGNOSIS — Z8249 Family history of ischemic heart disease and other diseases of the circulatory system: Secondary | ICD-10-CM | POA: Diagnosis not present

## 2017-05-30 DIAGNOSIS — Z981 Arthrodesis status: Secondary | ICD-10-CM | POA: Insufficient documentation

## 2017-05-30 DIAGNOSIS — K219 Gastro-esophageal reflux disease without esophagitis: Secondary | ICD-10-CM | POA: Insufficient documentation

## 2017-05-30 DIAGNOSIS — Z7951 Long term (current) use of inhaled steroids: Secondary | ICD-10-CM | POA: Insufficient documentation

## 2017-05-30 DIAGNOSIS — Z79899 Other long term (current) drug therapy: Secondary | ICD-10-CM | POA: Diagnosis not present

## 2017-05-30 DIAGNOSIS — E669 Obesity, unspecified: Secondary | ICD-10-CM | POA: Insufficient documentation

## 2017-05-30 DIAGNOSIS — Q438 Other specified congenital malformations of intestine: Secondary | ICD-10-CM | POA: Insufficient documentation

## 2017-05-30 DIAGNOSIS — M549 Dorsalgia, unspecified: Secondary | ICD-10-CM | POA: Insufficient documentation

## 2017-05-30 DIAGNOSIS — Z9049 Acquired absence of other specified parts of digestive tract: Secondary | ICD-10-CM | POA: Insufficient documentation

## 2017-05-30 DIAGNOSIS — E039 Hypothyroidism, unspecified: Secondary | ICD-10-CM | POA: Diagnosis not present

## 2017-05-30 DIAGNOSIS — J45909 Unspecified asthma, uncomplicated: Secondary | ICD-10-CM | POA: Insufficient documentation

## 2017-05-30 DIAGNOSIS — Z6837 Body mass index (BMI) 37.0-37.9, adult: Secondary | ICD-10-CM | POA: Insufficient documentation

## 2017-05-30 DIAGNOSIS — Z882 Allergy status to sulfonamides status: Secondary | ICD-10-CM | POA: Diagnosis not present

## 2017-05-30 DIAGNOSIS — Z886 Allergy status to analgesic agent status: Secondary | ICD-10-CM | POA: Insufficient documentation

## 2017-05-30 DIAGNOSIS — Z885 Allergy status to narcotic agent status: Secondary | ICD-10-CM | POA: Diagnosis not present

## 2017-05-30 DIAGNOSIS — R1012 Left upper quadrant pain: Secondary | ICD-10-CM | POA: Diagnosis not present

## 2017-05-30 DIAGNOSIS — E1169 Type 2 diabetes mellitus with other specified complication: Secondary | ICD-10-CM

## 2017-05-30 DIAGNOSIS — Z90710 Acquired absence of both cervix and uterus: Secondary | ICD-10-CM | POA: Insufficient documentation

## 2017-05-30 DIAGNOSIS — M19019 Primary osteoarthritis, unspecified shoulder: Secondary | ICD-10-CM | POA: Diagnosis not present

## 2017-05-30 DIAGNOSIS — R1084 Generalized abdominal pain: Secondary | ICD-10-CM | POA: Diagnosis not present

## 2017-05-30 DIAGNOSIS — G8929 Other chronic pain: Secondary | ICD-10-CM | POA: Diagnosis not present

## 2017-05-30 DIAGNOSIS — I10 Essential (primary) hypertension: Secondary | ICD-10-CM | POA: Insufficient documentation

## 2017-05-30 HISTORY — PX: LAPAROSCOPY: SHX197

## 2017-05-30 LAB — TYPE AND SCREEN
ABO/RH(D): O POS
Antibody Screen: NEGATIVE

## 2017-05-30 LAB — GLUCOSE, CAPILLARY
Glucose-Capillary: 321 mg/dL — ABNORMAL HIGH (ref 65–99)
Glucose-Capillary: 292 mg/dL — ABNORMAL HIGH (ref 65–99)
Glucose-Capillary: 243 mg/dL — ABNORMAL HIGH (ref 65–99)

## 2017-05-30 SURGERY — LAPAROSCOPY, DIAGNOSTIC
Anesthesia: General

## 2017-05-30 MED ORDER — BUPIVACAINE HCL (PF) 0.25 % IJ SOLN
INTRAMUSCULAR | Status: AC
  Filled 2017-05-30: qty 30

## 2017-05-30 MED ORDER — GLYCOPYRROLATE 0.2 MG/ML IV SOSY
PREFILLED_SYRINGE | INTRAVENOUS | Status: AC
  Filled 2017-05-30: qty 5

## 2017-05-30 MED ORDER — GABAPENTIN 300 MG PO CAPS: 300.0000 mg | ORAL_CAPSULE | ORAL | Status: DC

## 2017-05-30 MED ORDER — PROPOFOL 10 MG/ML IV BOLUS
INTRAVENOUS | Status: AC
  Filled 2017-05-30: qty 20

## 2017-05-30 MED ORDER — KETAMINE HCL 10 MG/ML IJ SOLN
INTRAMUSCULAR | Status: DC | PRN
  Administered 2017-05-30: 20 mg via INTRAVENOUS

## 2017-05-30 MED ORDER — ROCURONIUM BROMIDE 10 MG/ML (PF) SYRINGE
PREFILLED_SYRINGE | INTRAVENOUS | Status: DC | PRN
  Administered 2017-05-30: 40 mg via INTRAVENOUS
  Administered 2017-05-30: 20 mg via INTRAVENOUS

## 2017-05-30 MED ORDER — LIDOCAINE HCL 2 % IJ SOLN
INTRAMUSCULAR | Status: AC
  Filled 2017-05-30: qty 20

## 2017-05-30 MED ORDER — OXYCODONE HCL 5 MG PO TABS
ORAL_TABLET | ORAL | Status: DC
Start: 2017-05-30 — End: 2017-05-30
  Filled 2017-05-30: qty 1

## 2017-05-30 MED ORDER — LIDOCAINE 2% (20 MG/ML) 5 ML SYRINGE
INTRAMUSCULAR | Status: DC | PRN
  Administered 2017-05-30: 100 mg via INTRAVENOUS

## 2017-05-30 MED ORDER — FENTANYL CITRATE (PF) 100 MCG/2ML IJ SOLN
INTRAMUSCULAR | Status: AC
Start: 2017-05-30 — End: 2017-05-30
  Filled 2017-05-30: qty 2

## 2017-05-30 MED ORDER — ROCURONIUM BROMIDE 10 MG/ML (PF) SYRINGE
PREFILLED_SYRINGE | INTRAVENOUS | Status: AC
  Filled 2017-05-30: qty 5

## 2017-05-30 MED ORDER — OXYCODONE HCL 5 MG PO TABS
5.0000 mg | ORAL_TABLET | Freq: Once | ORAL | Status: AC | PRN
  Administered 2017-05-30: 5 mg via ORAL

## 2017-05-30 MED ORDER — EPHEDRINE SULFATE-NACL 50-0.9 MG/10ML-% IV SOSY
PREFILLED_SYRINGE | INTRAVENOUS | Status: DC | PRN
  Administered 2017-05-30: 15 mg via INTRAVENOUS

## 2017-05-30 MED ORDER — SUCCINYLCHOLINE CHLORIDE 200 MG/10ML IV SOSY
PREFILLED_SYRINGE | INTRAVENOUS | Status: DC | PRN
  Administered 2017-05-30: 100 mg via INTRAVENOUS

## 2017-05-30 MED ORDER — ONDANSETRON HCL 4 MG/2ML IJ SOLN
INTRAMUSCULAR | Status: DC | PRN
  Administered 2017-05-30: 4 mg via INTRAVENOUS

## 2017-05-30 MED ORDER — CHLORHEXIDINE GLUCONATE 4 % EX LIQD: 60.0000 mL | Freq: Once | CUTANEOUS | Status: DC

## 2017-05-30 MED ORDER — ACETAMINOPHEN 500 MG PO TABS: 1000.0000 mg | ORAL_TABLET | ORAL | Status: DC

## 2017-05-30 MED ORDER — CEFAZOLIN SODIUM-DEXTROSE 2-4 GM/100ML-% IV SOLN
2.0000 g | INTRAVENOUS | Status: AC
  Administered 2017-05-30: 2 g via INTRAVENOUS
  Filled 2017-05-30: qty 100

## 2017-05-30 MED ORDER — LACTATED RINGERS IV SOLN
INTRAVENOUS | Status: DC
  Administered 2017-05-30 (×2): via INTRAVENOUS

## 2017-05-30 MED ORDER — BUPIVACAINE-EPINEPHRINE (PF) 0.5% -1:200000 IJ SOLN
INTRAMUSCULAR | Status: DC | PRN
  Administered 2017-05-30: 50 mL via PERINEURAL

## 2017-05-30 MED ORDER — LACTATED RINGERS IR SOLN
Status: DC | PRN
  Administered 2017-05-30: 1

## 2017-05-30 MED ORDER — INSULIN ASPART 100 UNIT/ML ~~LOC~~ SOLN
SUBCUTANEOUS | Status: AC
  Filled 2017-05-30: qty 1

## 2017-05-30 MED ORDER — SUCCINYLCHOLINE CHLORIDE 200 MG/10ML IV SOSY
PREFILLED_SYRINGE | INTRAVENOUS | Status: AC
  Filled 2017-05-30: qty 10

## 2017-05-30 MED ORDER — KETAMINE HCL 10 MG/ML IJ SOLN
INTRAMUSCULAR | Status: AC
  Filled 2017-05-30: qty 1

## 2017-05-30 MED ORDER — MIDAZOLAM HCL 5 MG/5ML IJ SOLN
INTRAMUSCULAR | Status: DC | PRN
  Administered 2017-05-30: 2 mg via INTRAVENOUS

## 2017-05-30 MED ORDER — ONDANSETRON HCL 4 MG/2ML IJ SOLN
INTRAMUSCULAR | Status: AC
  Filled 2017-05-30: qty 2

## 2017-05-30 MED ORDER — SUGAMMADEX SODIUM 500 MG/5ML IV SOLN
INTRAVENOUS | Status: DC | PRN
  Administered 2017-05-30: 500 mg via INTRAVENOUS

## 2017-05-30 MED ORDER — 0.9 % SODIUM CHLORIDE (POUR BTL) OPTIME
TOPICAL | Status: DC | PRN
  Administered 2017-05-30: 1000 mL

## 2017-05-30 MED ORDER — BUPIVACAINE LIPOSOME 1.3 % IJ SUSP
20.0000 mL | Freq: Once | INTRAMUSCULAR | Status: DC
  Filled 2017-05-30: qty 20

## 2017-05-30 MED ORDER — GABAPENTIN 300 MG PO CAPS
ORAL_CAPSULE | ORAL | Status: AC
  Administered 2017-05-30: 300 mg via ORAL
  Filled 2017-05-30: qty 1

## 2017-05-30 MED ORDER — LIDOCAINE 20MG/ML (2%) 15 ML SYRINGE OPTIME
INTRAMUSCULAR | Status: DC | PRN
  Administered 2017-05-30: 1 mg/kg/h via INTRAVENOUS

## 2017-05-30 MED ORDER — CIPROFLOXACIN IN D5W 400 MG/200ML IV SOLN
400.0000 mg | INTRAVENOUS | Status: AC
  Administered 2017-05-30: 400 mg via INTRAVENOUS
  Filled 2017-05-30: qty 200

## 2017-05-30 MED ORDER — ACETAMINOPHEN 500 MG PO TABS
ORAL_TABLET | ORAL | Status: AC
  Administered 2017-05-30: 1000 mg via ORAL
  Filled 2017-05-30: qty 2

## 2017-05-30 MED ORDER — SUGAMMADEX SODIUM 500 MG/5ML IV SOLN
INTRAVENOUS | Status: AC
  Filled 2017-05-30: qty 5

## 2017-05-30 MED ORDER — MIDAZOLAM HCL 2 MG/2ML IJ SOLN
INTRAMUSCULAR | Status: AC
  Filled 2017-05-30: qty 2

## 2017-05-30 MED ORDER — PROPOFOL 10 MG/ML IV BOLUS
INTRAVENOUS | Status: DC | PRN
  Administered 2017-05-30: 120 mg via INTRAVENOUS

## 2017-05-30 MED ORDER — LIDOCAINE 2% (20 MG/ML) 5 ML SYRINGE
INTRAMUSCULAR | Status: AC
  Filled 2017-05-30: qty 5

## 2017-05-30 MED ORDER — INSULIN ASPART 100 UNIT/ML ~~LOC~~ SOLN
8.0000 [IU] | Freq: Once | SUBCUTANEOUS | Status: AC
  Administered 2017-05-30: 8 [IU] via SUBCUTANEOUS

## 2017-05-30 MED ORDER — ONDANSETRON HCL 4 MG/2ML IJ SOLN: 4.0000 mg | Freq: Once | INTRAMUSCULAR | Status: DC | PRN

## 2017-05-30 MED ORDER — GLYCOPYRROLATE 0.2 MG/ML IV SOSY
PREFILLED_SYRINGE | INTRAVENOUS | Status: DC | PRN
  Administered 2017-05-30: .4 mg via INTRAVENOUS

## 2017-05-30 MED ORDER — OXYCODONE HCL 5 MG/5ML PO SOLN: 5.0000 mg | Freq: Once | ORAL | Status: AC | PRN

## 2017-05-30 MED ORDER — HYDROMORPHONE HCL 1 MG/ML IJ SOLN: 0.2500 mg | INTRAMUSCULAR | Status: DC | PRN

## 2017-05-30 MED ORDER — GABAPENTIN 300 MG PO CAPS
300.0000 mg | ORAL_CAPSULE | ORAL | Status: AC
  Administered 2017-05-30: 300 mg via ORAL

## 2017-05-30 MED ORDER — FENTANYL CITRATE (PF) 100 MCG/2ML IJ SOLN
INTRAMUSCULAR | Status: DC | PRN
  Administered 2017-05-30: 25 ug via INTRAVENOUS
  Administered 2017-05-30: 50 ug via INTRAVENOUS
  Administered 2017-05-30: 25 ug via INTRAVENOUS

## 2017-05-30 MED ORDER — ACETAMINOPHEN 500 MG PO TABS
1000.0000 mg | ORAL_TABLET | ORAL | Status: AC
  Administered 2017-05-30: 1000 mg via ORAL

## 2017-05-30 MED ORDER — INSULIN ASPART 100 UNIT/ML ~~LOC~~ SOLN
SUBCUTANEOUS | Status: AC
  Administered 2017-05-30: 5 [IU]
  Filled 2017-05-30: qty 1

## 2017-05-30 MED ORDER — BUPIVACAINE-EPINEPHRINE 0.5% -1:200000 IJ SOLN
INTRAMUSCULAR | Status: AC
  Filled 2017-05-30: qty 1

## 2017-05-30 SURGICAL SUPPLY — 73 items
APPLIER CLIP 5 13 M/L LIGAMAX5 (MISCELLANEOUS)
APPLIER CLIP ROT 10 11.4 M/L (STAPLE)
BLADE EXTENDED COATED 6.5IN (ELECTRODE) ×2 IMPLANT
BLADE SURG SZ10 CARB STEEL (BLADE) IMPLANT
CELLS DAT CNTRL 66122 CELL SVR (MISCELLANEOUS) IMPLANT
CHLORAPREP W/TINT 26ML (MISCELLANEOUS) ×2 IMPLANT
CLIP APPLIE 5 13 M/L LIGAMAX5 (MISCELLANEOUS) IMPLANT
CLIP APPLIE ROT 10 11.4 M/L (STAPLE) IMPLANT
CLIP VESOCCLUDE LG 6/CT (CLIP) IMPLANT
COVER MAYO STAND STRL (DRAPES) IMPLANT
COVER SURGICAL LIGHT HANDLE (MISCELLANEOUS) ×2 IMPLANT
DECANTER SPIKE VIAL GLASS SM (MISCELLANEOUS) ×2 IMPLANT
DERMABOND ADVANCED (GAUZE/BANDAGES/DRESSINGS) ×1
DERMABOND ADVANCED .7 DNX12 (GAUZE/BANDAGES/DRESSINGS) ×1 IMPLANT
DRAIN CHANNEL 19F RND (DRAIN) IMPLANT
DRAPE LAPAROSCOPIC ABDOMINAL (DRAPES) IMPLANT
DRAPE SHEET LG 3/4 BI-LAMINATE (DRAPES) IMPLANT
DRAPE WARM FLUID 44X44 (DRAPE) IMPLANT
DRSG OPSITE POSTOP 4X6 (GAUZE/BANDAGES/DRESSINGS) IMPLANT
DRSG OPSITE POSTOP 4X8 (GAUZE/BANDAGES/DRESSINGS) IMPLANT
ELECT PENCIL ROCKER SW 15FT (MISCELLANEOUS) IMPLANT
ELECT REM PT RETURN 15FT ADLT (MISCELLANEOUS) ×2 IMPLANT
EVACUATOR SILICONE 100CC (DRAIN) ×2 IMPLANT
GAUZE SPONGE 4X4 12PLY STRL (GAUZE/BANDAGES/DRESSINGS) ×2 IMPLANT
GLOVE BIO SURGEON STRL SZ7.5 (GLOVE) ×4 IMPLANT
GLOVE BIOGEL M STRL SZ7.5 (GLOVE) ×2 IMPLANT
GLOVE BIOGEL PI IND STRL 7.0 (GLOVE) ×1 IMPLANT
GLOVE BIOGEL PI INDICATOR 7.0 (GLOVE) ×1
GLOVE INDICATOR 8.0 STRL GRN (GLOVE) ×4 IMPLANT
GOWN STRL REUS W/TWL XL LVL3 (GOWN DISPOSABLE) ×12 IMPLANT
HANDLE SUCTION POOLE (INSTRUMENTS) IMPLANT
IRRIG SUCT STRYKERFLOW 2 WTIP (MISCELLANEOUS)
IRRIGATION SUCT STRKRFLW 2 WTP (MISCELLANEOUS) IMPLANT
KIT BASIN OR (CUSTOM PROCEDURE TRAY) ×2 IMPLANT
LEGGING LITHOTOMY PAIR STRL (DRAPES) IMPLANT
NS IRRIG 1000ML POUR BTL (IV SOLUTION) ×4 IMPLANT
PACK GENERAL/GYN (CUSTOM PROCEDURE TRAY) ×2 IMPLANT
RTRCTR WOUND ALEXIS 18CM MED (MISCELLANEOUS)
SCISSORS LAP 5X35 DISP (ENDOMECHANICALS) ×2 IMPLANT
SHEARS FOC LG CVD HARMONIC 17C (MISCELLANEOUS) IMPLANT
SHEARS HARMONIC ACE PLUS 36CM (ENDOMECHANICALS) IMPLANT
SLEEVE XCEL OPT CAN 5 100 (ENDOMECHANICALS) ×2 IMPLANT
SPONGE LAP 18X18 X RAY DECT (DISPOSABLE) IMPLANT
STAPLER VISISTAT 35W (STAPLE) ×2 IMPLANT
STRIP CLOSURE SKIN 1/2X4 (GAUZE/BANDAGES/DRESSINGS) IMPLANT
SUCTION POOLE HANDLE (INSTRUMENTS)
SUT MNCRL AB 4-0 PS2 18 (SUTURE) ×2 IMPLANT
SUT PDS AB 1 CTX 36 (SUTURE) IMPLANT
SUT PDS AB 1 TP1 96 (SUTURE) IMPLANT
SUT PDS AB 3-0 SH 27 (SUTURE) ×2 IMPLANT
SUT PDS AB 4-0 SH 27 (SUTURE) IMPLANT
SUT PROLENE 2 0 BLUE (SUTURE) IMPLANT
SUT PROLENE 2 0 KS (SUTURE) IMPLANT
SUT PROLENE 2 0 SH DA (SUTURE) IMPLANT
SUT SILK 2 0 (SUTURE) ×2
SUT SILK 2 0 SH CR/8 (SUTURE) ×4 IMPLANT
SUT SILK 2 0SH CR/8 30 (SUTURE) IMPLANT
SUT SILK 2-0 18XBRD TIE 12 (SUTURE) ×2 IMPLANT
SUT SILK 2-0 30XBRD TIE 12 (SUTURE) IMPLANT
SUT SILK 3 0 (SUTURE) ×2
SUT SILK 3 0 SH CR/8 (SUTURE) ×4 IMPLANT
SUT SILK 3-0 18XBRD TIE 12 (SUTURE) ×2 IMPLANT
SYR BULB IRRIGATION 50ML (SYRINGE) IMPLANT
SYS LAPSCP GELPORT 120MM (MISCELLANEOUS)
SYSTEM LAPSCP GELPORT 120MM (MISCELLANEOUS) IMPLANT
TOWEL OR 17X26 10 PK STRL BLUE (TOWEL DISPOSABLE) ×2 IMPLANT
TOWEL OR NON WOVEN STRL DISP B (DISPOSABLE) ×2 IMPLANT
TRAY FOLEY W/METER SILVER 16FR (SET/KITS/TRAYS/PACK) ×2 IMPLANT
TRAY LAPAROSCOPIC (CUSTOM PROCEDURE TRAY) ×2 IMPLANT
TROCAR BLADELESS OPT 5 100 (ENDOMECHANICALS) ×2 IMPLANT
TROCAR XCEL NON-BLD 11X100MML (ENDOMECHANICALS) IMPLANT
YANKAUER SUCT BULB TIP 10FT TU (MISCELLANEOUS) IMPLANT
YANKAUER SUCT BULB TIP NO VENT (SUCTIONS) IMPLANT

## 2017-05-30 NOTE — Anesthesia Postprocedure Evaluation (Signed)
Anesthesia Post Note  Patient: Brianna Mitchell  Procedure(s) Performed: LAPAROSCOPY DIAGNOSTIC (N/A )     Patient location during evaluation: PACU Anesthesia Type: General Level of consciousness: awake and alert Pain management: pain level controlled Vital Signs Assessment: post-procedure vital signs reviewed and stable Respiratory status: spontaneous breathing, nonlabored ventilation, respiratory function stable and patient connected to nasal cannula oxygen Cardiovascular status: blood pressure returned to baseline and stable Postop Assessment: no apparent nausea or vomiting Anesthetic complications: no    Last Vitals:  Vitals:   05/30/17 1100 05/30/17 1130  BP: 111/75 115/78  Pulse: 73 73  Resp: (!) 9 11  Temp:    SpO2: 95% 97%    Last Pain:  Vitals:   05/30/17 1150  TempSrc:   PainSc: 5                  Agripina Guyette COKER

## 2017-05-30 NOTE — Anesthesia Procedure Notes (Signed)
Procedure Name: Intubation Date/Time: 05/30/2017 7:36 AM Performed by: Lind Covert, CRNA Pre-anesthesia Checklist: Patient identified, Emergency Drugs available, Suction available, Patient being monitored and Timeout performed Patient Re-evaluated:Patient Re-evaluated prior to induction Oxygen Delivery Method: Circle system utilized Preoxygenation: Pre-oxygenation with 100% oxygen Induction Type: IV induction, Rapid sequence and Cricoid Pressure applied Laryngoscope Size: Mac and 4 Grade View: Grade I Tube type: Oral Tube size: 7.0 mm Number of attempts: 1 Airway Equipment and Method: Stylet Placement Confirmation: ETT inserted through vocal cords under direct vision,  positive ETCO2 and breath sounds checked- equal and bilateral Secured at: 21 cm Tube secured with: Tape Dental Injury: Teeth and Oropharynx as per pre-operative assessment

## 2017-05-30 NOTE — Progress Notes (Signed)
Dr Linna Caprice aware of CBG 243. Order received to give Novolog 5 units.

## 2017-05-30 NOTE — Transfer of Care (Signed)
Immediate Anesthesia Transfer of Care Note  Patient: Brianna Mitchell  Procedure(s) Performed: LAPAROSCOPY DIAGNOSTIC (N/A )  Patient Location: PACU  Anesthesia Type:General  Level of Consciousness: sedated  Airway & Oxygen Therapy: Patient Spontanous Breathing and Patient connected to face mask oxygen  Post-op Assessment: Report given to RN and Post -op Vital signs reviewed and stable  Post vital signs: Reviewed and stable  Last Vitals:  Vitals Value Taken Time  BP 140/71 05/30/2017  9:13 AM  Temp    Pulse 90 05/30/2017  9:12 AM  Resp 14 05/30/2017  9:13 AM  SpO2 95 % 05/30/2017  9:12 AM  Vitals shown include unvalidated device data.  Last Pain:  Vitals:   05/30/17 0550  TempSrc: Oral         Complications: No apparent anesthesia complications

## 2017-05-30 NOTE — H&P (Signed)
Brianna Mitchell is an 62 y.o. female.   Chief Complaint: here for surgery HPI: 62 year old female comes in for diagnostic laparoscopy.  I initially met her in February of this year for complaints of chronic abdominal pain which she described as stomach pain.  She states the pain was in her left upper abdomen, radiated across her midline and lower abdomen.  It was constant and daily.  She could not think of anything that aggravated or relieved it.  She also reported some intermittent vomiting but only small amounts of green liquid.  She stated that she had some reflux.  She stated that she had daily bowel movements which were normal.  I had remotely performed a cholecystectomy in her in 2011.  She is also been a long-standing diabetic.  I referred her back to her gastroneurologist who did an upper endoscopy which was unremarkable.  She had a normal gastric emptying study in 2015.  She had a CT scan of her abdomen which questioned an area of small bowel being tethered to her sigmoid colon but there was no evidence of bowel obstruction or wall thickness.  We had several conversations in the clinic as well as by phone.  The patient was quite insistent that something be done to evaluate her abdominal pain other than the testing which had been done.  We discussed the pros and cons of diagnostic laparoscopy.  Past Medical History:  Diagnosis Date  . Anemia   . Asthma   . Back pain   . Carpal tunnel syndrome   . Diabetes mellitus    type 2   . Fibroid tumor   . Ganglion cyst   . GERD (gastroesophageal reflux disease)   . Headache(784.0)   . Heart murmur    saw dr. Einar Gip (only sees if needed, not seen since 2011)  . Hypertension    dr Einar Gip     . Hypothyroidism   . Shortness of breath    on exertion   . Shoulder pain   . Tendonitis    left shoulder biceps tendonitis, acromioclavicular osteoarthritis  . Tennis elbow   . Thyroid disease     Past Surgical History:  Procedure Laterality Date  .  ABDOMINAL HYSTERECTOMY    . ANTERIOR LAT LUMBAR FUSION N/A 01/03/2013   Procedure: X LIF (Extreme Lateral Interbody Fusion) L3-L4,  (1 LEVEL) ;  Surgeon: Melina Schools, MD;  Location: [facility];  Service: Orthopedics;  Laterality: N/A;  . BACK SURGERY     x5  . CHOLECYSTECTOMY    . COLONOSCOPY  04/18/2017   Dr Juanita Craver ; [place]   . EYE SURGERY  05/23/2017   lasik of right eye, to have left eye done on 06-13-17   . KNEE ARTHROSCOPY Right 04/2014  . LEFT HEART CATHETERIZATION WITH CORONARY ANGIOGRAM N/A 04/27/2011   Procedure: LEFT HEART CATHETERIZATION WITH CORONARY ANGIOGRAM;  Surgeon: Laverda Page, MD;  Location: [facility] CATH LAB;  Service: Cardiovascular;  Laterality: N/A;  . pelvic bone removed    . ROTATOR CUFF REPAIR Left    x3 surgeries; Dr Nicki Reaper Dean.[facility]   . SHOULDER ARTHROSCOPY WITH SUBACROMIAL DECOMPRESSION, ROTATOR CUFF REPAIR AND BICEP TENDON REPAIR Left 02/03/2016   Procedure: SHOULDER ARTHROSCOPY WITH SUBACROMIAL DECOMPRESSION, DEBRIDEMENT, BICEPS TENODESIS, DISTAL CLAVICLE EXCISION.;  Surgeon: Meredith Pel, MD;  Location: [place];  Service: Orthopedics;  Laterality: Left;  . THYROID SURGERY     LUMP REMOVED  . TUBAL LIGATION  Family History  Problem Relation Age of Onset  . Heart disease Mother   . Heart disease Father   . Breast cancer Sister   . Heart disease Sister   . Heart disease Brother    Social History:  reports that she has never smoked. She has never used smokeless tobacco. She reports that she does not drink alcohol or use drugs.  Allergies:  Allergies  Allergen Reactions  . Aspirin Swelling  . Sulfa Antibiotics Swelling  . Demerol Rash  . Penicillins Rash and Other (See Comments)    Has patient had a PCN reaction causing immediate rash, facial/tongue/throat swelling, SOB or lightheadedness with hypotension: No Has patient had a PCN reaction causing severe rash involving mucus membranes or skin necrosis:  No Has patient had a PCN reaction that required hospitalization: Yes Has patient had a PCN reaction occurring within the last 10 years: No  If all of the above answers are "NO", then may proceed with Cephalosporin use.     Medications Prior to Admission  Medication Sig Dispense Refill  . albuterol (PROVENTIL HFA;VENTOLIN HFA) 108 (90 BASE) MCG/ACT inhaler Inhale 2 puffs into the lungs every 6 (six) hours as needed for wheezing or shortness of breath.     Marland Kitchen albuterol (PROVENTIL) (2.5 MG/3ML) 0.083% nebulizer solution Take 2.5 mg by nebulization every 6 (six) hours as needed for wheezing or shortness of breath.     . beclomethasone (QVAR) 80 MCG/ACT inhaler Inhale 1 puff into the lungs daily.     Marland Kitchen dexlansoprazole (DEXILANT) 60 MG capsule Take 60 mg by mouth daily.    . diclofenac (FLECTOR) 1.3 % PTCH APPLY 1 Patch every 12 HOURS as needed. THEN OFF 12 HOURS, cut to 2 square inch size (Patient taking differently: Place 1 patch onto the skin every 12 (twelve) hours as needed (for pain). ) 30 patch 1  . Difluprednate (DUREZOL) 0.05 % EMUL Place 1 drop into the right eye 2 (two) times daily.    Marland Kitchen EVZIO 0.4 MG/0.4ML SOAJ Inject 0.4 mg into the muscle once.   0  . HUMALOG KWIKPEN 100 UNIT/ML KiwkPen Inject 5-12 Units into the skin 3 (three) times daily. Based on sliding scale 100-150 = 5 units, 151-200 = 6 units, 201-250 = 8 units, 251-300 = 10 units, 301-350 = 12 units    . HUMULIN N KWIKPEN 100 UNIT/ML Kiwkpen Inject 45 Units into the skin 2 (two) times daily.     Marland Kitchen levothyroxine (SYNTHROID, LEVOTHROID) 75 MCG tablet Take 75 mcg by mouth daily.    Marland Kitchen moxifloxacin (VIGAMOX) 0.5 % ophthalmic solution Place 1 drop into the right eye 2 (two) times daily.  1  . ondansetron (ZOFRAN-ODT) 4 MG disintegrating tablet Take 1 tablet (4 mg total) by mouth every 8 (eight) hours as needed for nausea or vomiting. 50 tablet 0  . oxyCODONE-acetaminophen (PERCOCET) 10-325 MG tablet Take 1 tablet by mouth every 6 (six)  hours as needed for pain.    Marland Kitchen oxymorphone (OPANA ER) 30 MG 12 hr tablet Take 30 mg by mouth twice daily  0  . polyethylene glycol (MIRALAX / GLYCOLAX) packet Take 17 g by mouth daily. (Patient taking differently: Take 17 g by mouth daily as needed for mild constipation. ) 24 each 0  . rizatriptan (MAXALT-MLT) 10 MG disintegrating tablet Take 1 tablet (10 mg total) by mouth as needed for migraine. May repeat in 2 hours if needed 9 tablet 11  . rosuvastatin (CRESTOR) 40 MG tablet Take 40  mg by mouth daily.    Marland Kitchen topiramate (TOPAMAX) 100 MG tablet Take 1 tablet (100 mg total) by mouth 2 (two) times daily. 180 tablet 4  . amitriptyline (ELAVIL) 25 MG tablet Take 1 tablet (25 mg total) by mouth at bedtime. (Patient not taking: Reported on 05/23/2017) 30 tablet 6    Results for orders placed or performed during the hospital encounter of 05/30/17 (from the past 48 hour(s))  Glucose, capillary     Status: Abnormal   Collection Time: 05/30/17  5:57 AM  Result Value Ref Range   Glucose-Capillary 321 (H) 65 - 99 mg/dL   Comment 1 Notify RN   Glucose, capillary     Status: Abnormal   Collection Time: 05/30/17  7:14 AM  Result Value Ref Range   Glucose-Capillary 292 (H) 65 - 99 mg/dL   Comment 1 Notify RN    No results found.  Review of Systems  Constitutional: Negative for weight loss.  HENT: Negative for nosebleeds.   Eyes: Negative for blurred vision.  Respiratory: Negative for shortness of breath.   Cardiovascular: Negative for chest pain, palpitations, orthopnea and PND.       Denies DOE  Gastrointestinal:       See hpi  Genitourinary: Negative for dysuria and hematuria.  Musculoskeletal: Negative.   Skin: Negative for itching and rash.  Neurological: Negative for dizziness, focal weakness, seizures, loss of consciousness and headaches.       Denies TIAs, amaurosis fugax  Endo/Heme/Allergies: Does not bruise/bleed easily.  Psychiatric/Behavioral: The patient is not nervous/anxious.      Blood pressure 135/83, pulse 94, temperature 98.6 F (37 C), temperature source Oral, resp. rate 18, height 4' 9" (1.448 m), weight 78 kg (172 lb), SpO2 100 %. Physical Exam  Vitals reviewed. Constitutional: She is oriented to person, place, and time. She appears well-developed and well-nourished. No distress.  HENT:  Head: Normocephalic and atraumatic.  Right Ear: External ear normal.  Left Ear: External ear normal.  Eyes: Conjunctivae are normal. No scleral icterus.  Neck: Normal range of motion. Neck supple. No tracheal deviation present. No thyromegaly present.  Cardiovascular: Normal rate and normal heart sounds.  Respiratory: Effort normal and breath sounds normal. No stridor. No respiratory distress. She has no wheezes.  GI: Soft. No hernia.  Some abd obesity; soft, very very mild TTP  Musculoskeletal: She exhibits no edema or tenderness.  Lymphadenopathy:    She has no cervical adenopathy.  Neurological: She is alert and oriented to person, place, and time. She exhibits normal muscle tone.  Skin: Skin is warm and dry. No rash noted. She is not diaphoretic. No erythema. No pallor.  Psychiatric: She has a normal mood and affect. Her behavior is normal. Judgment and thought content normal.     Assessment/Plan Obesity, chronic generalized abdominal pain Hypertension Diabetes mellitus  Again I had a very prolonged conversation with the patient that my thought that diagnostic laparoscopy would not be of significant benefit.  Discussed at length the risk and benefits of the proposed procedure including but not limited to bleeding, infection, injury to surrounding structures, need to convert to an open procedure, incisional hernia, if we did have an enterotomy the possibility of needing bowel resection with its associated risks such as leak or stricture and incisional hernia, failure to diagnose her problem, continued pain after surgery, blood clot formation as well as perioperative  cardiac and pulmonary events.  Despite these risk the patient believes that she would benefit from surgery  and would like to proceed with diagnostic evaluation  Leighton Ruff. Redmond Pulling, MD, FACS General, Bariatric, & Minimally Invasive Surgery [facility], [place]  Greer Pickerel, MD 05/30/2017, 7:23 AM

## 2017-05-30 NOTE — Anesthesia Preprocedure Evaluation (Signed)
Anesthesia Evaluation  Patient identified by MRN, date of birth, ID band Patient awake    Reviewed: Allergy & Precautions, NPO status , Patient's Chart, lab work & pertinent test results  Airway Mallampati: II  TM Distance: >3 FB     Dental  (+) Teeth Intact, Dental Advisory Given   Pulmonary    breath sounds clear to auscultation       Cardiovascular hypertension,  Rhythm:Regular Rate:Normal     Neuro/Psych    GI/Hepatic   Endo/Other  diabetes  Renal/GU      Musculoskeletal   Abdominal   Peds  Hematology   Anesthesia Other Findings   Reproductive/Obstetrics                             Anesthesia Physical Anesthesia Plan  ASA: III  Anesthesia Plan: General   Post-op Pain Management:    Induction: Intravenous  PONV Risk Score and Plan: Ondansetron  Airway Management Planned: Oral ETT  Additional Equipment:   Intra-op Plan:   Post-operative Plan: Extubation in OR  Informed Consent: I have reviewed the patients History and Physical, chart, labs and discussed the procedure including the risks, benefits and alternatives for the proposed anesthesia with the patient or authorized representative who has indicated his/her understanding and acceptance.   Dental advisory given  Plan Discussed with: CRNA and Anesthesiologist  Anesthesia Plan Comments: (Glucose 321 will give 8U Novolog SubQ)        Anesthesia Quick Evaluation

## 2017-05-30 NOTE — Op Note (Signed)
05/30/2017  8:47 AM  PATIENT:  Brianna Mitchell  62 y.o. female  PRE-OPERATIVE DIAGNOSIS:  Chronic abdominal pain  POST-OPERATIVE DIAGNOSIS:  Chronic abdominal pain  PROCEDURE:  Procedure(s): LAPAROSCOPY DIAGNOSTIC  SURGEON:  Surgeon(s): Greer Pickerel, MD  ASSISTANTS: Ileana Roup, MD (an assistant was was required because it was unclear what her intra-abdominal anatomy would be like, possibility of severe adhesions possibly requiring bowel resection)  ANESTHESIA:   general  DRAINS: none   LOCAL MEDICATIONS USED:  MARCAINE     SPECIMEN:  No Specimen  DISPOSITION OF SPECIMEN:  N/A  COUNTS:  YES  INDICATION FOR PROCEDURE: 62 year old female referred to my clinic for complaints of persistent daily abdominal pain by her gastroenterologist.  She had a history of hysterectomy as well as cholecystectomy.  There is concerns that some of her complaints of chronic abdominal pain could be due to adhesions.  Her complaints of abdominal pain were somewhat vague.  She complained of periumbilical, left upper quadrant, and bilateral lower abdominal discomfort.  She described it as daily.  Nothing would really aggravate it or relieve it.  She was having daily bowel movements.  She had had a normal upper endoscopy.  She had a CT scan which questioned a section of small bowel being tethered to an area of sigmoid colon.  An upper GI was unremarkable other than some reflux.  We discussed at length over phone as well as in person again the risk and benefits of diagnostic laparoscopy please see chart for additional details  PROCEDURE: After obtaining informed consent the patient was brought to the operating room on [facility] placed supine on the operating room table.  General endotracheal anesthesia was established.  A Foley catheter was placed.  Her arms were placed at her sides with the appropriate padding.  Preoperative antibiotic was administered.  A surgical timeout was performed.  I  elected to gain access to her abdomen using the [facility] technique.  A small supraumbilical incision was made subcutaneous tissue was spread.  Fascia was incised with a scalpel.  Coker's were placed on the fascial edges and the abdominal cavity was entered.  A pursestring suture was placed around the fascial edges with 0 Vicryl and a 12 mm Hassan trocar was placed and pneumoperitoneum was smoothly established up to a patient pressure of 15 mmHg without any change in vital signs.  The laparoscope was advanced and the abdominal wall was surveilled.  There is no evidence of adhesions in any abdominal quadrant to the anterior abdominal wall.  Two5 mm trochars were placed in the left lateral abdomen under direct visualization after local been infiltrated.  The patient was placed in Trendelenburg and rotated to the left.  I visualized the right colon and cecum and the appendix all of which was normal.  I then identified the terminal ileum started to run the small bowel back proximally using atraumatic bowel graspers.  I ran the small bowel in its entirety lifting it up and looking not only at the bowel wall but also the mesentery.  The small bowel was ran all the way back to the ligament of Treitz and there is no evidence of small bowel pathology.  There is no evidence of a Meckel's diverticulum.  There is no evidence of a stricture.  There was no change in small bowel caliber.  The pelvis was identified.  There were obvious changes in her pelvis due to her prior hysterectomy.  She had a somewhat redundant sigmoid colon  but no evidence of gross disease in the pelvis.  The left colon was visualized in the transverse colon was visualized as well and there is no evidence of external abnormality to the structures.  At this point I felt that we had ruled out any obvious external source for her chronic abdominal pain.  At this point the [facility] trocar was removed and the previously placed pursestring suture was tied down.  There  is no palpable defect.  There is no leakage of air at the umbilical closure.  We infiltrated local and bilateral lateral abdominal walls as a tap block.  The 2 remaining trochars were removed after pneumoperitoneum was released.  All skin incisions were closed with a 4-0 Monocryl followed by the application of Dermabond.  All needle, instrument sponge counts were correct x2 there were no immediate complications.  The patient was extubated and taken to recovery room in stable condition  PLAN OF CARE: Discharge to home after PACU  PATIENT DISPOSITION:  PACU - hemodynamically stable.   Delay start of Pharmacological VTE agent (>24hrs) due to surgical blood loss or risk of bleeding:  not applicable  Leighton Ruff. Redmond Pulling, MD, FACS General, Bariatric, & Minimally Invasive Surgery [facility], [place]

## 2017-05-30 NOTE — Discharge Instructions (Signed)
[facility] °LAPAROSCOPIC SURGERY: POST OP INSTRUCTIONS °Always review your discharge instruction sheet given to you by the facility where your surgery was performed. °IF YOU HAVE DISABILITY OR FAMILY LEAVE FORMS, YOU MUST BRING THEM TO THE OFFICE FOR PROCESSING.   °DO NOT GIVE THEM TO YOUR DOCTOR. ° °1. A prescription for pain medication may be given to you upon discharge.  Take your pain medication as prescribed, if needed.  If narcotic pain medicine is not needed, then you may take acetaminophen (Tylenol) or ibuprofen (Advil) as needed. °2. Take your usually prescribed medications unless otherwise directed. °3. If you need a refill on your pain medication, please contact your pharmacy.  They will contact our office to request authorization. Prescriptions will not be filled after 5pm or on week-ends. °4. You should follow a light diet the first few days after arrival home, such as soup and crackers, etc.  Be sure to include lots of fluids daily. °5. Most patients will experience some swelling and bruising in the area of the incisions.  Ice packs will help.  Swelling and bruising can take several days to resolve.  °6. It is common to experience some constipation if taking pain medication after surgery.  Increasing fluid intake and taking a stool softener (such as Colace) will usually help or prevent this problem from occurring.  A mild laxative (Milk of Magnesia or Miralax) should be taken according to package instructions if there are no bowel movements after 48 hours. °7. If your surgeon used skin glue on the incision, you may shower in 24 hours.  The glue will flake off over the next 2-3 weeks.  Any sutures or staples will be removed at the office during your follow-up visit. °8. ACTIVITIES:  You may resume regular (light) daily activities beginning the next day--such as daily self-care, walking, climbing stairs--gradually increasing activities as tolerated.  You may have sexual  intercourse when it is comfortable.  Refrain from any heavy lifting or straining until approved by your doctor. °a. You may drive when you are no longer taking prescription pain medication, you can comfortably wear a seatbelt, and you can safely maneuver your car and apply brakes. °9. You should see your doctor in the office for a follow-up appointment approximately 2-3 weeks after your surgery.  Make sure that you call for this appointment within a day or two after you arrive home to insure a convenient appointment time. °10. OTHER INSTRUCTIONS:  °WHEN TO CALL YOUR DOCTOR: °1. Fever over 101.0 °2. Inability to urinate °3. Continued bleeding from incision. °4. Increased pain, redness, or drainage from the incision. °5. Increasing abdominal pain ° °The clinic staff is available to answer your questions during regular business hours.  Please don’t hesitate to call and ask to speak to one of the nurses for clinical concerns.  If you have a medical emergency, go to the nearest emergency room or call 911.  A surgeon from [facility] is always on call at the hospital. °[address], [address], [place], [place]  [address] ? [address] [address], [place], [place]   [address] °[phone] ? [phone] ? FAX [phone] °Web site: [email or web address] ° °

## 2017-06-02 ENCOUNTER — Ambulatory Visit (INDEPENDENT_AMBULATORY_CARE_PROVIDER_SITE_OTHER): Payer: Medicaid Other | Admitting: Orthopedic Surgery

## 2017-06-13 DIAGNOSIS — H2512 Age-related nuclear cataract, left eye: Secondary | ICD-10-CM | POA: Diagnosis not present

## 2017-06-21 DIAGNOSIS — I10 Essential (primary) hypertension: Secondary | ICD-10-CM | POA: Diagnosis not present

## 2017-06-22 ENCOUNTER — Ambulatory Visit (INDEPENDENT_AMBULATORY_CARE_PROVIDER_SITE_OTHER): Payer: Medicaid Other | Admitting: Orthopedic Surgery

## 2017-06-29 ENCOUNTER — Telehealth: Payer: Self-pay | Admitting: Nurse Practitioner

## 2017-06-29 ENCOUNTER — Ambulatory Visit (INDEPENDENT_AMBULATORY_CARE_PROVIDER_SITE_OTHER): Payer: Medicaid Other | Admitting: Orthopedic Surgery

## 2017-06-29 NOTE — Telephone Encounter (Signed)
Pt has appt with Hoyle Sauer on 9/23 for migraines, Dr Mamie Nick pt

## 2017-06-30 DIAGNOSIS — K219 Gastro-esophageal reflux disease without esophagitis: Secondary | ICD-10-CM | POA: Diagnosis not present

## 2017-06-30 DIAGNOSIS — R109 Unspecified abdominal pain: Secondary | ICD-10-CM | POA: Diagnosis not present

## 2017-07-04 ENCOUNTER — Telehealth: Payer: Self-pay | Admitting: *Deleted

## 2017-07-04 NOTE — Telephone Encounter (Signed)
Received from [facility] review and pt getting rizatriptan every month while on preventative, amitriptyline.  She has cancelled 2 appt due to (eye surgery and then another appt conflict).  She now has appt in 11-07-17.  I gave her several options 07-05-17, 07-06-17 and 07-18-17 as earlier appts and she could not make these.  Will try to see if other cancellations later and she prefers mornings.

## 2017-07-06 ENCOUNTER — Ambulatory Visit: Payer: 59 | Admitting: Adult Health

## 2017-07-08 DIAGNOSIS — R109 Unspecified abdominal pain: Secondary | ICD-10-CM | POA: Diagnosis not present

## 2017-07-08 DIAGNOSIS — N644 Mastodynia: Secondary | ICD-10-CM | POA: Diagnosis not present

## 2017-07-14 ENCOUNTER — Encounter (INDEPENDENT_AMBULATORY_CARE_PROVIDER_SITE_OTHER): Payer: Self-pay | Admitting: Orthopedic Surgery

## 2017-07-14 ENCOUNTER — Ambulatory Visit (INDEPENDENT_AMBULATORY_CARE_PROVIDER_SITE_OTHER): Payer: Medicare Other | Admitting: Orthopedic Surgery

## 2017-07-14 DIAGNOSIS — M25512 Pain in left shoulder: Secondary | ICD-10-CM | POA: Diagnosis not present

## 2017-07-14 DIAGNOSIS — G8929 Other chronic pain: Secondary | ICD-10-CM | POA: Diagnosis not present

## 2017-07-16 ENCOUNTER — Encounter (INDEPENDENT_AMBULATORY_CARE_PROVIDER_SITE_OTHER): Payer: Self-pay | Admitting: Orthopedic Surgery

## 2017-07-16 NOTE — Progress Notes (Signed)
Office Visit Note   Patient: Brianna Mitchell           Date of Birth: 1955/04/17           MRN: [id number] Visit Date: 07/14/2017 Requested by: Dorothyann Peng, MD [address] [place], [place] [address] PCP: Dorothyann Peng, MD  Subjective: Chief Complaint  Patient presents with  . Left Shoulder - Follow-up    HPI: Brianna Mitchell is a patient with left shoulder pain.  She has had 2 surgeries on that shoulder.  She states "I am doing about the same.  Is not taking any medication for the pain.  1217 she had left shoulder arthroscopy with subacromial decompression biceps tenodesis and distal clavicle excision.  She is in pain management.  It is hard for her to sleep on that left hand side at times.              ROS: All systems reviewed are negative as they relate to the chief complaint within the history of present illness.  Patient denies  fevers or chills.   Assessment & Plan: Visit Diagnoses:  1. Chronic left shoulder pain     Plan: Impression is left shoulder pain but nothing really definitively operative at this time.  She is had work-up since her surgery.  I do not think there is anything definitively that can be done.  She is had multiple injections as well.  For now she is in a stable course with some symptoms as that as I would expect from shoulder that had 2 prior surgeries.  I will see her back as needed  Follow-Up Instructions: Return if symptoms worsen or fail to improve.   Orders:  No orders of the defined types were placed in this encounter.  No orders of the defined types were placed in this encounter.     Procedures: No procedures performed   Clinical Data: No additional findings.  Objective: Vital Signs: There were no vitals taken for this visit.  Physical Exam:   Constitutional: Patient appears well-developed HEENT:  Head: Normocephalic Eyes:EOM are normal Neck: Normal range of motion Cardiovascular: Normal rate Pulmonary/chest: Effort  normal Neurologic: Patient is alert Skin: Skin is warm Psychiatric: Patient has normal mood and affect    Ortho Exam: Orthopedic exam demonstrates full active and passive range of motion of the right hand side on the left she is got forward flexion and abduction both above 90 degrees.  Pretty reasonable rotator cuff strength.  Does have some superior deltoid tenderness on the left.  Not much in the way of coarse grinding or crepitus with active or passive range of motion.  Specialty Comments:  No specialty comments available.  Imaging: No results found.   PMFS History: Patient Active Problem List   Diagnosis Date Noted  . 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 Anemia   . Asthma   . Back pain   . Carpal tunnel syndrome   . Diabetes mellitus    type 2   . Fibroid tumor   . Ganglion cyst   . GERD (gastroesophageal reflux disease)   . Headache(784.0)   . Heart murmur    saw dr. Jacinto Halim (only sees if needed, not seen since 2011)  . Hypertension    dr Jacinto Halim     . Hypothyroidism   . Shortness of breath    on exertion   . Shoulder pain   . Tendonitis    left shoulder  biceps tendonitis, acromioclavicular osteoarthritis  . Tennis elbow   . Thyroid disease     Family History  Problem Relation Age of Onset  . Heart disease Mother   . Heart disease Father   . Breast cancer Sister   . Heart disease Sister   . Heart disease Brother     Past Surgical History:  Procedure Laterality Date  . ABDOMINAL HYSTERECTOMY    . ANTERIOR LAT LUMBAR FUSION N/A 01/03/2013   Procedure: X LIF (Extreme Lateral Interbody Fusion) L3-L4,  (1 LEVEL) ;  Surgeon: Venita Lick, MD;  Location: [facility] OR;  Service: Orthopedics;  Laterality: N/A;  . BACK SURGERY     x5  . CHOLECYSTECTOMY    . COLONOSCOPY  04/18/2017   Dr Charna Elizabeth ; [facility]   . EYE SURGERY  05/23/2017   lasik of right eye, to have left eye done on 06-13-17   . KNEE ARTHROSCOPY  Right 04/2014  . LAPAROSCOPY N/A 05/30/2017   Procedure: LAPAROSCOPY DIAGNOSTIC;  Surgeon: Gaynelle Adu, MD;  Location: [facility] ORS;  Service: General;  Laterality: N/A;  . LEFT HEART CATHETERIZATION WITH CORONARY ANGIOGRAM N/A 04/27/2011   Procedure: LEFT HEART CATHETERIZATION WITH CORONARY ANGIOGRAM;  Surgeon: Pamella Pert, MD;  Location: [facility] CATH LAB;  Service: Cardiovascular;  Laterality: N/A;  . pelvic bone removed    . ROTATOR CUFF REPAIR Left    x3 surgeries; Dr Lorin Picket Rockie Vawter[facility]   . SHOULDER ARTHROSCOPY WITH SUBACROMIAL DECOMPRESSION, ROTATOR CUFF REPAIR AND BICEP TENDON REPAIR Left 02/03/2016   Procedure: SHOULDER ARTHROSCOPY WITH SUBACROMIAL DECOMPRESSION, DEBRIDEMENT, BICEPS TENODESIS, DISTAL CLAVICLE EXCISION.;  Surgeon: Cammy Copa, MD;  Location: [facility] OR;  Service: Orthopedics;  Laterality: Left;  . THYROID SURGERY     LUMP REMOVED  . TUBAL LIGATION     Social History   Occupational History  . Not on file  Tobacco Use  . Smoking status: Never Smoker  . Smokeless tobacco: Never Used  Substance and Sexual Activity  . Alcohol use: No    Alcohol/week: 1.0 oz    Types: 2 Standard drinks or equivalent per week  . Drug use: No  . Sexual activity: Not on file

## 2017-07-18 DIAGNOSIS — G894 Chronic pain syndrome: Secondary | ICD-10-CM | POA: Diagnosis not present

## 2017-07-18 DIAGNOSIS — Z79891 Long term (current) use of opiate analgesic: Secondary | ICD-10-CM | POA: Diagnosis not present

## 2017-07-25 DIAGNOSIS — J45998 Other asthma: Secondary | ICD-10-CM | POA: Diagnosis not present

## 2017-09-21 LAB — BASIC METABOLIC PANEL
BUN: 16 (ref 4–21)
Creatinine: 1.1 (ref 0.5–1.1)
Glucose: 86
Potassium: 3.8 (ref 3.4–5.3)
Sodium: 140 (ref 137–147)

## 2017-09-21 LAB — HEPATIC FUNCTION PANEL
ALT: 22 (ref 7–35)
AST: 17 (ref 13–35)
Alkaline Phosphatase: 110 (ref 25–125)

## 2017-09-21 LAB — LIPID PANEL
Cholesterol: 203 — AB (ref 0–200)
HDL: 79 — AB (ref 35–70)
LDL Cholesterol: 110
LDl/HDL Ratio: 1.4
Triglycerides: 72 (ref 40–160)

## 2017-10-05 ENCOUNTER — Ambulatory Visit: Payer: Medicare Other | Admitting: *Deleted

## 2017-11-03 ENCOUNTER — Other Ambulatory Visit: Payer: Self-pay | Admitting: Diagnostic Neuroimaging

## 2017-11-07 ENCOUNTER — Ambulatory Visit: Payer: Medicaid Other | Admitting: Nurse Practitioner

## 2017-11-07 ENCOUNTER — Other Ambulatory Visit: Payer: Self-pay | Admitting: Diagnostic Neuroimaging

## 2017-11-10 ENCOUNTER — Ambulatory Visit: Payer: Medicare Other | Admitting: Registered"

## 2017-11-26 IMAGING — MR MR SHOULDER*L* W/O CM
5 of 6 series · 32 of 40 positions shown · non-contrast
Comparison: None.

CLINICAL DATA: Left shoulder pain.  Pain for 5 months.

EXAM:
MRI OF THE LEFT SHOULDER WITHOUT CONTRAST
TECHNIQUE: Multiplanar, multisequence MR imaging of the shoulder was performed.
No intravenous contrast was administered.

[Series 3: T2 fat-sat · axial · 4.0mm · 0.55mm/px · z∈[-65,+11]mm · 8 of 20 slices shown (1 of 4)]
[im 1/20]
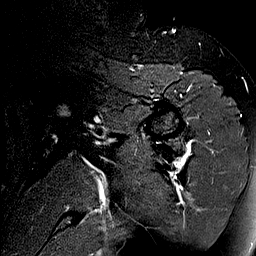
[im 3/20]
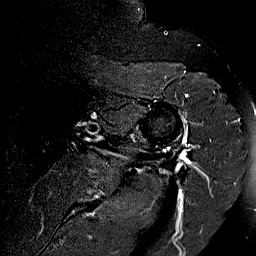
[im 6/20]
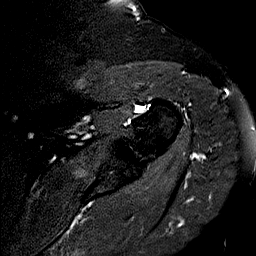
[im 9/20]
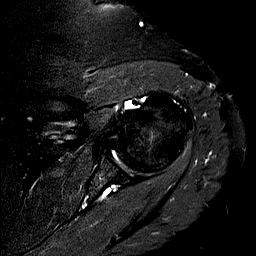
[im 11/20]
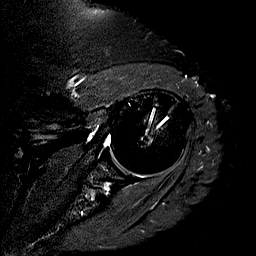
[im 14/20]
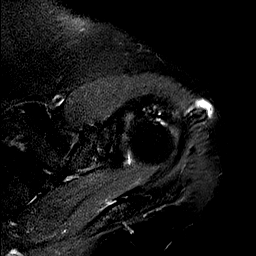
[im 17/20]
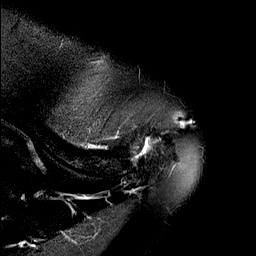
[im 20/20]
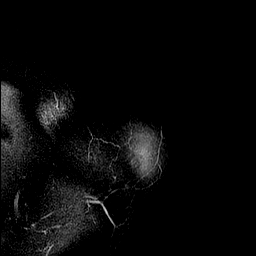

[Series 4: T2 fat-sat · coronal · 4.0mm · 0.55mm/px · 7 of 20 slices shown (2 of 4)]
[im 1/20]
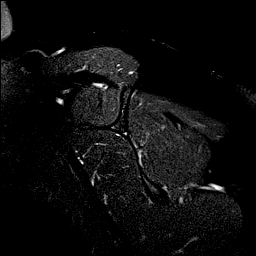
[im 4/20]
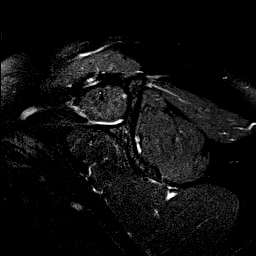
[im 7/20]
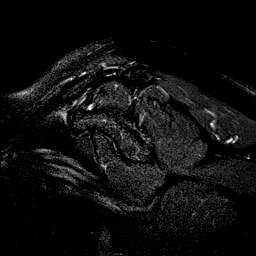
[im 10/20]
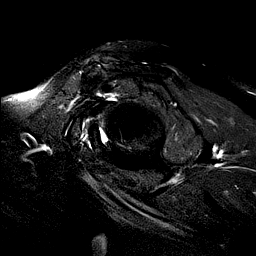
[im 13/20]
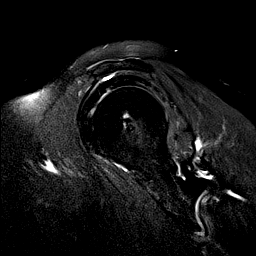
[im 16/20]
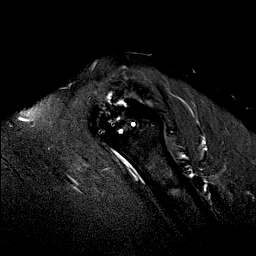
[im 20/20]
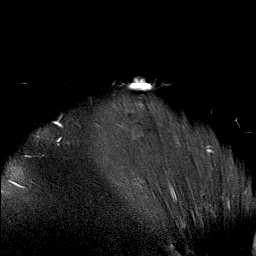

[Series 6: T2 fat-sat · oblique · 4.0mm · 0.27mm/px · 6 of 17 slices shown (3 of 4)]
[im 1/17]
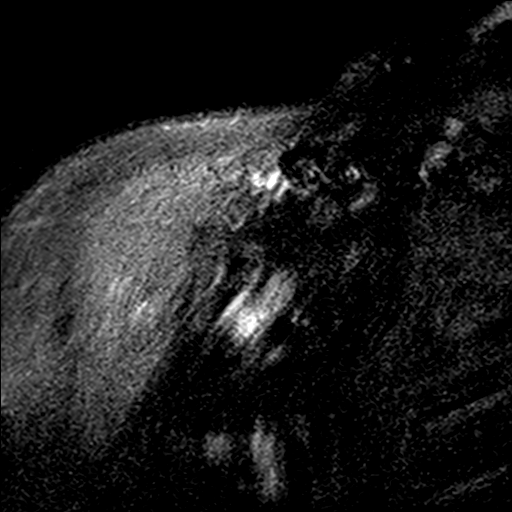
[im 4/17]
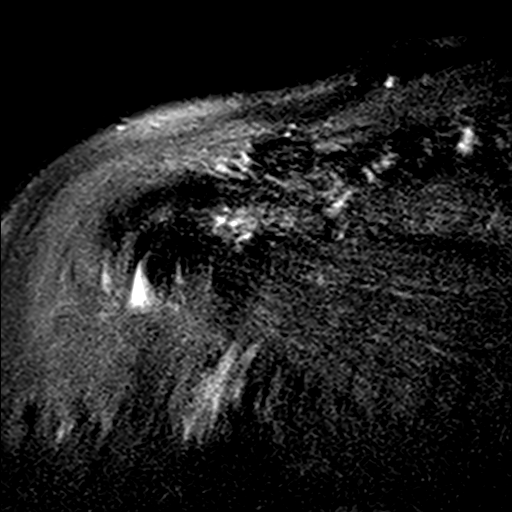
[im 7/17]
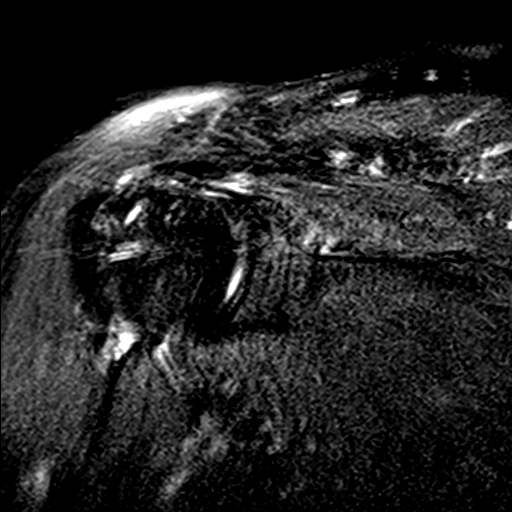
[im 10/17]
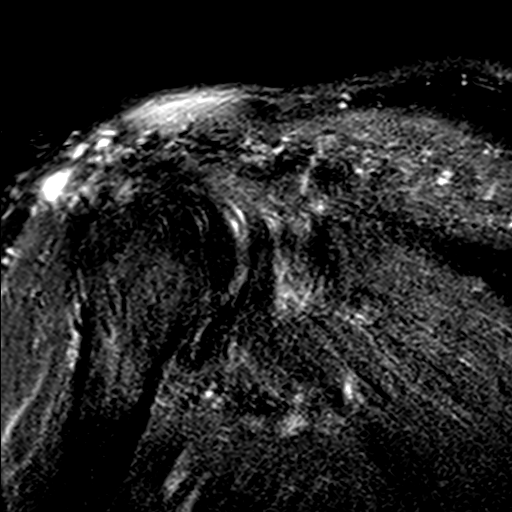
[im 13/17]
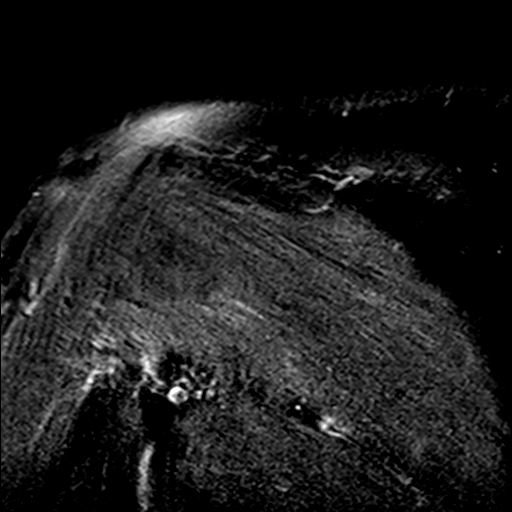
[im 17/17]
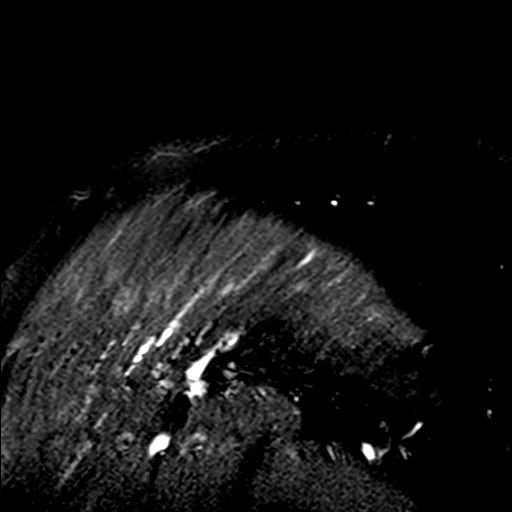

[Series 7: PD · oblique · 4.0mm · 0.44mm/px · 6 of 17 slices shown]
[im 1/17]
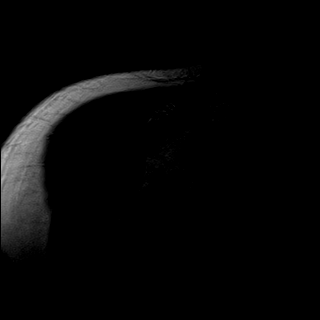
[im 4/17]
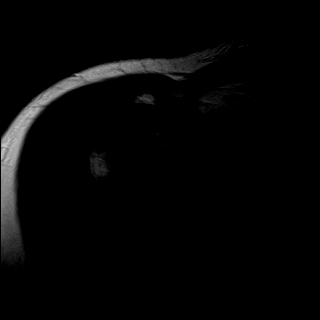
[im 7/17]
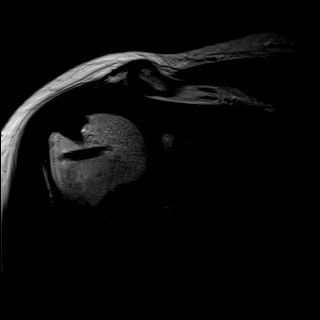
[im 10/17]
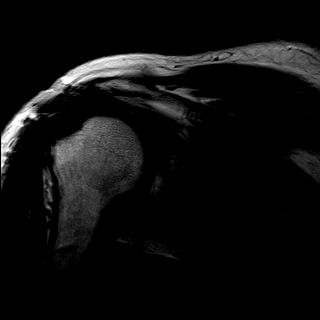
[im 13/17]
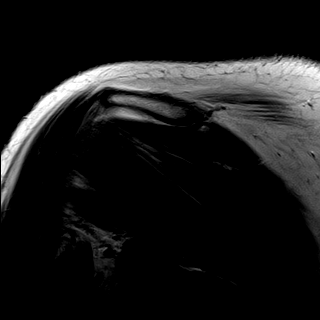
[im 17/17]
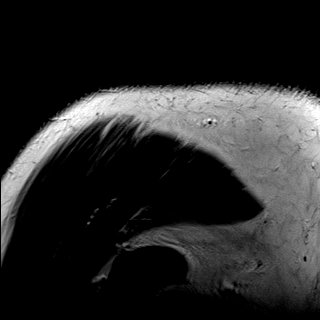

[Series 8: T2 fat-sat · oblique · 4.0mm · 0.27mm/px · 5 of 17 slices shown (4 of 4)]
[im 1/17]
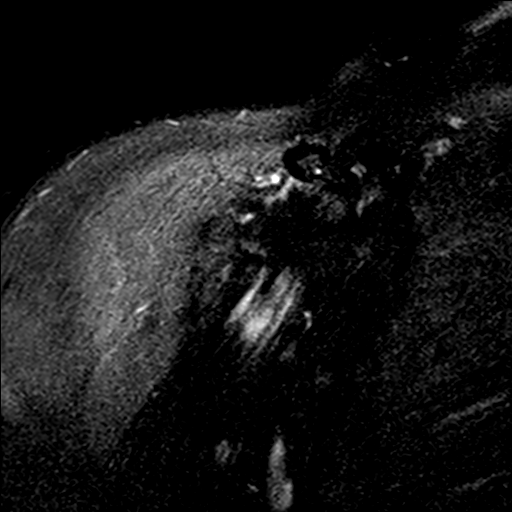
[im 4/17]
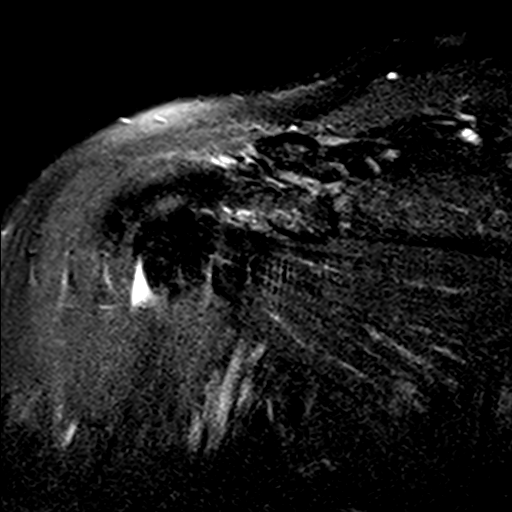
[im 7/17]
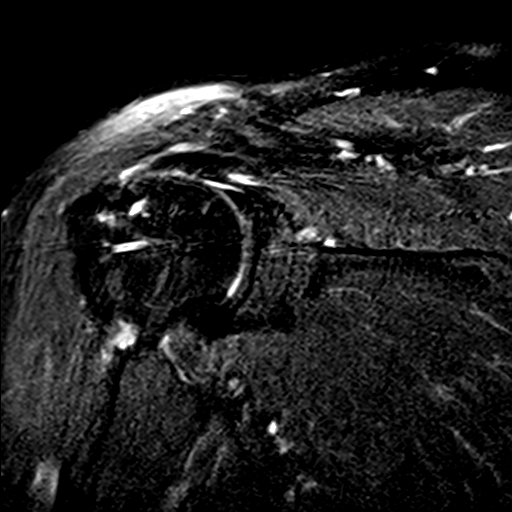
[im 10/17]
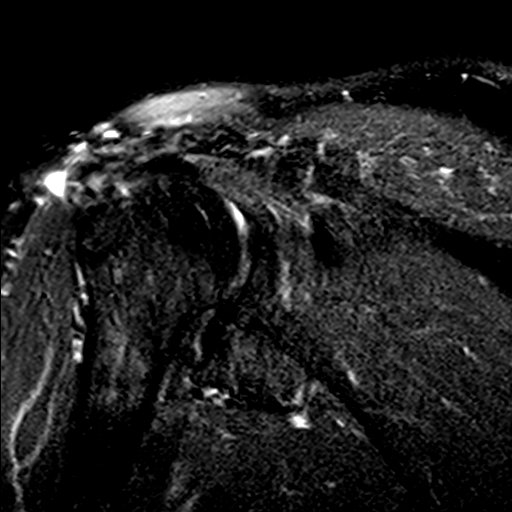
[im 13/17]
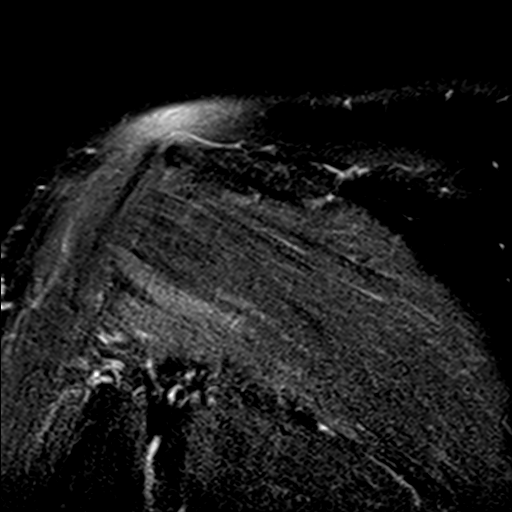

[32 of 40 positions shown; findings below may reference images not displayed]

FINDINGS: Significant patient motion degrading image quality limiting
evaluation.

Rotator cuff: Prior rotator cuff repair. Small full-thickness tear
versus postsurgical changes of the junction of the supraspinatus
-infraspinatus tendon. A majority of the supraspinatus and
infraspinatus tendons are intact. Mild tendinosis of the
infraspinatus tendon. Teres minor tendon is intact. Subscapularis
tendon is intact.

Muscles: No atrophy or fatty replacement of nor abnormal signal
within, the muscles of the rotator cuff.

Biceps long head: Tendinosis of the intraarticular portion of the
long head of the biceps tendon.

Acromioclavicular Joint: Mild arthropathy of the acromioclavicular
joint. Type I acromion. No significant subacromial/ subdeltoid
bursal fluid.

Glenohumeral Joint: No joint effusion.  No chondral defect.

Labrum: Limited evaluation of the labrum secondary to patient motion
and lack of intra-articular contrast. No gross labral tear.

Bones:  No marrow abnormality, fracture or dislocation.

Other: No fluid collection or hematoma.
IMPRESSION: Significant patient motion degrading image quality limiting
evaluation.

1. Prior rotator cuff repair. Small full-thickness tear versus
postsurgical changes of the junction of the supraspinatus
-infraspinatus tendon. A majority of the supraspinatus and
infraspinatus tendons are intact. Mild tendinosis of the
infraspinatus tendon.
2. Tendinosis of the intraarticular portion of the long head of the
biceps tendon.

## 2017-11-28 ENCOUNTER — Other Ambulatory Visit: Payer: Self-pay | Admitting: Internal Medicine

## 2017-11-30 ENCOUNTER — Other Ambulatory Visit: Payer: Self-pay | Admitting: Diagnostic Neuroimaging

## 2017-11-30 ENCOUNTER — Other Ambulatory Visit: Payer: Self-pay | Admitting: Nurse Practitioner

## 2017-11-30 DIAGNOSIS — M792 Neuralgia and neuritis, unspecified: Secondary | ICD-10-CM

## 2017-11-30 MED ORDER — PREGABALIN 50 MG PO CAPS: 50.0000 mg | ORAL_CAPSULE | Freq: Two times a day (BID) | ORAL | 2 refills | Status: DC

## 2017-12-13 LAB — HM DIABETES EYE EXAM

## 2017-12-21 ENCOUNTER — Encounter: Payer: Self-pay | Admitting: Nurse Practitioner

## 2017-12-21 DIAGNOSIS — R109 Unspecified abdominal pain: Secondary | ICD-10-CM | POA: Insufficient documentation

## 2017-12-21 DIAGNOSIS — E119 Type 2 diabetes mellitus without complications: Secondary | ICD-10-CM | POA: Insufficient documentation

## 2017-12-21 DIAGNOSIS — E1165 Type 2 diabetes mellitus with hyperglycemia: Secondary | ICD-10-CM

## 2017-12-22 ENCOUNTER — Ambulatory Visit: Payer: Medicare Other

## 2017-12-22 ENCOUNTER — Encounter: Payer: Medicare Other | Admitting: Internal Medicine

## 2017-12-24 ENCOUNTER — Other Ambulatory Visit: Payer: Self-pay | Admitting: Nurse Practitioner

## 2017-12-29 ENCOUNTER — Ambulatory Visit: Payer: Medicare Other | Admitting: Registered"

## 2018-01-03 ENCOUNTER — Other Ambulatory Visit: Payer: Self-pay | Admitting: Nurse Practitioner

## 2018-01-23 ENCOUNTER — Other Ambulatory Visit: Payer: Self-pay | Admitting: Nurse Practitioner

## 2018-01-23 DIAGNOSIS — M792 Neuralgia and neuritis, unspecified: Secondary | ICD-10-CM

## 2018-01-27 ENCOUNTER — Ambulatory Visit: Payer: Medicare Other

## 2018-01-27 ENCOUNTER — Ambulatory Visit: Payer: Medicare Other | Admitting: Nurse Practitioner

## 2018-02-16 ENCOUNTER — Ambulatory Visit: Payer: Medicaid Other | Admitting: Nurse Practitioner

## 2018-02-21 ENCOUNTER — Ambulatory Visit: Payer: Medicare Other | Admitting: Registered"

## 2018-03-08 ENCOUNTER — Encounter: Payer: Self-pay | Admitting: Internal Medicine

## 2018-03-08 ENCOUNTER — Ambulatory Visit (INDEPENDENT_AMBULATORY_CARE_PROVIDER_SITE_OTHER): Payer: Medicare Other

## 2018-03-08 ENCOUNTER — Ambulatory Visit (INDEPENDENT_AMBULATORY_CARE_PROVIDER_SITE_OTHER): Payer: Medicare Other | Admitting: Internal Medicine

## 2018-03-08 ENCOUNTER — Other Ambulatory Visit: Payer: Self-pay

## 2018-03-08 VITALS — BP 142/80 | HR 82 | Temp 98.2°F | Ht <= 58 in | Wt 169.0 lb

## 2018-03-08 DIAGNOSIS — Z1239 Encounter for other screening for malignant neoplasm of breast: Secondary | ICD-10-CM

## 2018-03-08 DIAGNOSIS — R1032 Left lower quadrant pain: Secondary | ICD-10-CM | POA: Diagnosis not present

## 2018-03-08 DIAGNOSIS — G4709 Other insomnia: Secondary | ICD-10-CM | POA: Diagnosis not present

## 2018-03-08 DIAGNOSIS — M25511 Pain in right shoulder: Secondary | ICD-10-CM

## 2018-03-08 DIAGNOSIS — I10 Essential (primary) hypertension: Secondary | ICD-10-CM | POA: Diagnosis not present

## 2018-03-08 DIAGNOSIS — E1165 Type 2 diabetes mellitus with hyperglycemia: Secondary | ICD-10-CM

## 2018-03-08 DIAGNOSIS — Z0000 Encounter for general adult medical examination without abnormal findings: Secondary | ICD-10-CM | POA: Diagnosis not present

## 2018-03-08 DIAGNOSIS — G44021 Chronic cluster headache, intractable: Secondary | ICD-10-CM

## 2018-03-08 DIAGNOSIS — E039 Hypothyroidism, unspecified: Secondary | ICD-10-CM

## 2018-03-08 DIAGNOSIS — Z794 Long term (current) use of insulin: Secondary | ICD-10-CM

## 2018-03-08 DIAGNOSIS — R7309 Other abnormal glucose: Secondary | ICD-10-CM

## 2018-03-08 LAB — POCT URINALYSIS DIPSTICK
Bilirubin, UA: NEGATIVE
Blood, UA: NEGATIVE
Glucose, UA: NEGATIVE
Ketones, UA: NEGATIVE
Leukocytes, UA: NEGATIVE
Nitrite, UA: NEGATIVE
Protein, UA: POSITIVE — AB
Spec Grav, UA: 1.015 (ref 1.010–1.025)
Urobilinogen, UA: 0.2 E.U./dL
pH, UA: 7 (ref 5.0–8.0)

## 2018-03-08 LAB — POCT UA - MICROALBUMIN
Albumin/Creatinine Ratio, Urine, POC: >300
Creatinine, POC: 100 mg/dL
Microalbumin Ur, POC: 150 mg/L

## 2018-03-08 NOTE — Progress Notes (Signed)
Subjective:   Brianna Mitchell is a 63 y.o. female who presents for Medicare Annual (Subsequent) preventive examination.  Review of Systems:  n/a Cardiac Risk Factors include: diabetes mellitus;hypertension;obesity (BMI >30kg/m2);sedentary lifestyle     Objective:     Vitals: BP (!) 142/80 (BP Location: Left Arm, Patient Position: Sitting)   Pulse 82   Temp 98.2 F (36.8 C) (Oral)   Ht 4\' 9"  (1.448 m)   Wt 169 lb (76.7 kg)   SpO2 99%   BMI 36.57 kg/m   Body mass index is 36.57 kg/m.  Advanced Directives 03/08/2018 05/30/2017 05/27/2017 02/03/2016 01/27/2016 12/05/2015 01/01/2015  Does Patient Have a Medical Advance Directive? No No No No No No Yes;No  Does patient want to make changes to medical advance directive? - - - - - - No - Patient declined  Would patient like information on creating a medical advance directive? No - Patient declined No - Patient declined No - Patient declined - No - Patient declined No - patient declined information -  Pre-existing out of facility DNR order (yellow form or pink MOST form) - - - - - - -    Tobacco Social History   Tobacco Use  Smoking Status Never Smoker  Smokeless Tobacco Never Used     Counseling given: Not Answered   Clinical Intake:  Pre-visit preparation completed: Yes  Pain : 0-10 Pain Score: 9  Pain Type: Chronic pain(acute right shoulder) Pain Location: Back Pain Descriptors / Indicators: Aching(arm pain sharp and shooting) Pain Onset: More than a month ago Pain Frequency: Constant Pain Relieving Factors: nothing helps pain  Effect of Pain on Daily Activities: slows down daily activities  Pain Relieving Factors: nothing helps pain   Nutritional Status: BMI > 30  Obese Nutritional Risks: None Diabetes: Yes CBG done?: No Did pt. bring in CBG monitor from home?: No  How often do you need to have someone help you when you read instructions, pamphlets, or other written materials from your doctor or pharmacy?: 1 -  Never What is the last grade level you completed in school?: graduated nursing school  Interpreter Needed?: No  Information entered by :: NAllen LPN  Past Medical History:  Diagnosis Date  . Anemia   . Asthma   . Back pain   . Carpal tunnel syndrome   . Diabetes mellitus    type 2   . Fibroid tumor   . Ganglion cyst   . GERD (gastroesophageal reflux disease)   . Headache(784.0)   . Heart murmur    saw dr. Einar Gip (only sees if needed, not seen since 2011)  . Hypertension    dr Einar Gip     . Hypothyroidism   . Shortness of breath    on exertion   . Shoulder pain   . Tendonitis    left shoulder biceps tendonitis, acromioclavicular osteoarthritis  . Tennis elbow   . Thyroid disease    Past Surgical History:  Procedure Laterality Date  . ABDOMINAL HYSTERECTOMY    . ANTERIOR LAT LUMBAR FUSION N/A 01/03/2013   Procedure: X LIF (Extreme Lateral Interbody Fusion) L3-L4,  (1 LEVEL) ;  Surgeon: Melina Schools, MD;  Location: [facility];  Service: Orthopedics;  Laterality: N/A;  . BACK SURGERY     x5  . CHOLECYSTECTOMY    . COLONOSCOPY  04/18/2017   Dr Juanita Craver ; [place]   . EYE SURGERY  05/23/2017   lasik of right eye, to have left eye done  on 06-13-17   . KNEE ARTHROSCOPY Right 04/2014  . LAPAROSCOPY N/A 05/30/2017   Procedure: LAPAROSCOPY DIAGNOSTIC;  Surgeon: Greer Pickerel, MD;  Location: [facility];  Service: General;  Laterality: N/A;  . LEFT HEART CATHETERIZATION WITH CORONARY ANGIOGRAM N/A 04/27/2011   Procedure: LEFT HEART CATHETERIZATION WITH CORONARY ANGIOGRAM;  Surgeon: Laverda Page, MD;  Location: [facility] CATH [facility];  Service: Cardiovascular;  Laterality: N/A;  . pelvic bone removed    . ROTATOR CUFF REPAIR Left    x3 surgeries; Dr Nicki Reaper Dean[facility]   . SHOULDER ARTHROSCOPY WITH SUBACROMIAL DECOMPRESSION, ROTATOR CUFF REPAIR AND BICEP TENDON REPAIR Left 02/03/2016   Procedure: SHOULDER ARTHROSCOPY WITH SUBACROMIAL DECOMPRESSION, DEBRIDEMENT, BICEPS  TENODESIS, DISTAL CLAVICLE EXCISION.;  Surgeon: Meredith Pel, MD;  Location: [place];  Service: Orthopedics;  Laterality: Left;  . THYROID SURGERY     LUMP REMOVED  . TUBAL LIGATION     Family History  Problem Relation Age of Onset  . Heart disease Mother   . Heart disease Father   . Breast cancer Sister   . Heart disease Sister   . Heart disease Brother    Social History   Socioeconomic History  . Marital status: Divorced    Spouse name: Not on file  . Number of children: Not on file  . Years of education: Not on file  . Highest education level: Not on file  Occupational History  . Occupation: disability  Social Needs  . Financial resource strain: Not on file  . Food insecurity:    Worry: Never true    Inability: Never true  . Transportation needs:    Medical: No    Non-medical: No  Tobacco Use  . Smoking status: Never Smoker  . Smokeless tobacco: Never Used  Substance and Sexual Activity  . Alcohol use: No    Alcohol/week: 2.0 standard drinks    Types: 2 Standard drinks or equivalent per week  . Drug use: No  . Sexual activity: Yes  Lifestyle  . Physical activity:    Days per week: 0 days    Minutes per session: 0 min  . Stress: Not at all  Relationships  . Social connections:    Talks on phone: Not on file    Gets together: Not on file    Attends religious service: Not on file    Active member of club or organization: Not on file    Attends meetings of clubs or organizations: Not on file    Relationship status: Not on file  Other Topics Concern  . Not on file  Social History Narrative  . Not on file    Outpatient Encounter Medications as of 03/08/2018  Medication Sig  . albuterol (PROVENTIL HFA;VENTOLIN HFA) 108 (90 BASE) MCG/ACT inhaler Inhale 2 puffs into the lungs every 6 (six) hours as needed for wheezing or shortness of breath.   Marland Kitchen albuterol (PROVENTIL) (2.5 MG/3ML) 0.083% nebulizer solution Take 2.5 mg by nebulization every 6 (six) hours as  needed for wheezing or shortness of breath.   Marland Kitchen amitriptyline (ELAVIL) 25 MG tablet Take 1 tablet (25 mg total) by mouth at bedtime.  . beclomethasone (QVAR) 80 MCG/ACT inhaler Inhale 1 puff into the lungs daily.   Marland Kitchen DEXILANT 60 MG capsule take 1 capsule by oral route every day  . Difluprednate (DUREZOL) 0.05 % EMUL Place 1 drop into the right eye 2 (two) times daily.  Marland Kitchen HUMALOG KWIKPEN 100 UNIT/ML KiwkPen Inject 5-12 Units into the skin 3 (  three) times daily. Based on sliding scale 70-100= 5 units, 101-150 = 7 units, 151-200 = 9 units, 201-250 = 11 units, 251-300 = 13 units, 301-350 = 15 units, > 350= 18 units  . HUMULIN N KWIKPEN 100 UNIT/ML Kiwkpen Inject 55 Units into the skin every morning. 55 units in the morning and 45 units in the evening  . hydrocortisone cream 0.5 % Apply 1 application topically 2 (two) times daily. Apply by topical route 2 times every day a thin layer to the affected areas  . levothyroxine (SYNTHROID, LEVOTHROID) 75 MCG tablet Take 75 mcg by mouth daily.  . ondansetron (ZOFRAN-ODT) 4 MG disintegrating tablet Take 1 tablet (4 mg total) by mouth every 8 (eight) hours as needed for nausea or vomiting.  Marland Kitchen oxyCODONE-acetaminophen (PERCOCET) 10-325 MG tablet Take 1 tablet by mouth every 6 (six) hours as needed for pain.  Marland Kitchen oxymorphone (OPANA ER) 30 MG 12 hr tablet Take 30 mg by mouth twice daily  . polyethylene glycol (MIRALAX / GLYCOLAX) packet Take 17 g by mouth daily. (Patient taking differently: Take 17 g by mouth daily as needed for mild constipation. )  . potassium chloride (KLOR-CON) 20 MEQ packet Take 20 mEq by mouth 2 (two) times daily.  . pregabalin (LYRICA) 50 MG capsule TAKE ONE CAPSULE BY MOUTH TWICE DAILY  . rizatriptan (MAXALT-MLT) 10 MG disintegrating tablet May repeat in 2 hours if needed. Max 2 tabs / 24 hours.  . rosuvastatin (CRESTOR) 40 MG tablet Take 40 mg by mouth daily.  Marland Kitchen topiramate (TOPAMAX) 100 MG tablet Take 1 tablet (100 mg total) by mouth 2 (two)  times daily.  . diclofenac (FLECTOR) 1.3 % PTCH APPLY 1 Patch every 12 HOURS as needed. THEN OFF 12 HOURS, cut to 2 square inch size (Patient not taking: Reported on 03/08/2018)  . EVZIO 0.4 MG/0.4ML SOAJ Inject 0.4 mg into the muscle once.   . moxifloxacin (VIGAMOX) 0.5 % ophthalmic solution Place 1 drop into the right eye 2 (two) times daily.   No facility-administered encounter medications on file as of 03/08/2018.     Activities of Daily Living In your present state of health, do you have any difficulty performing the following activities: 03/08/2018 05/27/2017  Hearing? N N  Vision? N N  Difficulty concentrating or making decisions? N N  Walking or climbing stairs? N N  Dressing or bathing? N N  Doing errands, shopping? N N  Preparing Food and eating ? N -  Using the Toilet? N -  In the past six months, have you accidently leaked urine? N -  Do you have problems with loss of bowel control? N -  Managing your Medications? N -  Managing your Finances? N -  Some recent data might be hidden    Patient Care Team: Glendale Chard, MD as PCP - General (Internal Medicine)    Assessment:   This is a routine wellness examination for [place].  Exercise Activities and Dietary recommendations Current Exercise Habits: Home exercise routine, Type of exercise: stretching, Frequency (Times/Week): 7, Intensity: Mild, Exercise limited by: None identified  Goals    . Weight (lb) < 200 lb (90.7 kg)     Eat healthy       Fall Risk Fall Risk  03/08/2018 11/16/2016 07/29/2015 01/01/2015  Falls in the past year? 0 No No No  Number falls in past yr: 0 - - -  Comment right leg went numb - - -  Injury with Fall? 0 - - -  Risk for fall due to : History of fall(s);Medication side effect - - -  Follow up Education provided;Falls prevention discussed - - -   Is the patient's home free of loose throw rugs in walkways, pet beds, electrical cords, etc?   yes      Grab bars in the bathroom? no       Handrails on the stairs?   yes      Adequate lighting?   yes  Timed Get Up and Go performed: n/a  Depression Screen PHQ 2/9 Scores 03/08/2018  PHQ - 2 Score 1  PHQ- 9 Score 7     Cognitive Function     6CIT Screen 03/08/2018  What Year? 0 points  What month? 0 points  What time? 0 points  Count back from 20 0 points  Months in reverse 4 points  Repeat phrase 2 points  Total Score 6    Immunization History  Administered Date(s) Administered  . Influenza,inj,Quad PF,6+ Mos 01/05/2013, 02/04/2016  . Pneumococcal Polysaccharide-23 01/05/2013  . Tdap 12/18/2012    Qualifies for Shingles Vaccine? yes  Screening Tests Health Maintenance  Topic Date Due  . FOOT EXAM  11/23/1964  . OPHTHALMOLOGY EXAM  11/23/1964  . URINE MICROALBUMIN  11/23/1964  . HIV Screening  11/23/1969  . PAP SMEAR-Modifier  11/24/1975  . MAMMOGRAM  11/23/2004  . HEMOGLOBIN A1C  11/26/2017  . COLONOSCOPY  03/09/2019 (Originally 11/23/2004)  . Hepatitis C Screening  03/09/2019 (Originally 12-Jan-1956)  . TETANUS/TDAP  12/19/2022  . INFLUENZA VACCINE  Completed  . PNEUMOCOCCAL POLYSACCHARIDE VACCINE AGE 42-64 HIGH RISK  Completed    Cancer Screenings: Lung: Low Dose CT Chest recommended if Age 41-80 years, 30 pack-year currently smoking OR have quit w/in 15years. Patient does not qualify. Breast:  Up to date on Mammogram? No   Up to date of Bone Density/Dexa? Yes Colorectal: declines  Additional Screenings: : Hepatitis C Screening: declines    Plan:    Declines colonoscopy and Hep C screening. Mammogram ordered.  I have personally reviewed and noted the following in the patient's chart:   . Medical and social history . Use of alcohol, tobacco or illicit drugs  . Current medications and supplements . Functional ability and status . Nutritional status . Physical activity . Advanced directives . List of other physicians . Hospitalizations, surgeries, and ER visits in previous 12  months . Vitals . Screenings to include cognitive, depression, and falls . Referrals and appointments  In addition, I have reviewed and discussed with patient certain preventive protocols, quality metrics, and best practice recommendations. A written personalized care plan for preventive services as well as general preventive health recommendations were provided to patient.     Kellie Simmering, LPN  [id number]/46/[id number]

## 2018-03-08 NOTE — Patient Instructions (Addendum)
Try taking Melatonin 3 mg to 10 mg along with Magnesium 250 mg every night for insomnia.   TV, computer, phone or other screen time disrupts your sleep, so you need to avoid it for at least  2 hours before you try to go to sleep.   Also dim the lights in the house in the evening, and try red light bulbe in the lamp of your room.

## 2018-03-08 NOTE — Patient Instructions (Signed)
Brianna Mitchell , Thank you for taking time to come for your Medicare Wellness Visit. I appreciate your ongoing commitment to your health goals. Please review the following plan we discussed and let me know if I can assist you in the future.   Screening recommendations/referrals: Colonoscopy: declined Mammogram: ordered Bone Density: 10/2012 Recommended yearly ophthalmology/optometry visit for glaucoma screening and checkup Recommended yearly dental visit for hygiene and checkup  Vaccinations: Influenza vaccine: 09/2017 Pneumococcal vaccine: 12/2012 Tdap vaccine: 12/2012 Shingles vaccine: declined    Advanced directives: Advance directive discussed with you today. Even though you declined this today please call our office should you change your mind and we can give you the proper paperwork for you to fill out.   Conditions/risks identified: Obesity  Next appointment: 03/08/2018  Preventive Care 40-64 Years, Female Preventive care refers to lifestyle choices and visits with your health care provider that can promote health and wellness. What does preventive care include?  A yearly physical exam. This is also called an annual well check.  Dental exams once or twice a year.  Routine eye exams. Ask your health care provider how often you should have your eyes checked.  Personal lifestyle choices, including:  Daily care of your teeth and gums.  Regular physical activity.  Eating a healthy diet.  Avoiding tobacco and drug use.  Limiting alcohol use.  Practicing safe sex.  Taking low-dose aspirin daily starting at age 19.  Taking vitamin and mineral supplements as recommended by your health care provider. What happens during an annual well check? The services and screenings done by your health care provider during your annual well check will depend on your age, overall health, lifestyle risk factors, and family history of disease. Counseling  Your health care provider may ask you  questions about your:  Alcohol use.  Tobacco use.  Drug use.  Emotional well-being.  Home and relationship well-being.  Sexual activity.  Eating habits.  Work and work Statistician.  Method of birth control.  Menstrual cycle.  Pregnancy history. Screening  You may have the following tests or measurements:  Height, weight, and BMI.  Blood pressure.  Lipid and cholesterol levels. These may be checked every 5 years, or more frequently if you are over 32 years old.  Skin check.  Lung cancer screening. You may have this screening every year starting at age 61 if you have a 30-pack-year history of smoking and currently smoke or have quit within the past 15 years.  Fecal occult blood test (FOBT) of the stool. You may have this test every year starting at age 67.  Flexible sigmoidoscopy or colonoscopy. You may have a sigmoidoscopy every 5 years or a colonoscopy every 10 years starting at age 83.  Hepatitis C blood test.  Hepatitis B blood test.  Sexually transmitted disease (STD) testing.  Diabetes screening. This is done by checking your blood sugar (glucose) after you have not eaten for a while (fasting). You may have this done every 1-3 years.  Mammogram. This may be done every 1-2 years. Talk to your health care provider about when you should start having regular mammograms. This may depend on whether you have a family history of breast cancer.  BRCA-related cancer screening. This may be done if you have a family history of breast, ovarian, tubal, or peritoneal cancers.  Pelvic exam and Pap test. This may be done every 3 years starting at age 20. Starting at age 36, this may be done every 5 years if you  have a Pap test in combination with an HPV test.  Bone density scan. This is done to screen for osteoporosis. You may have this scan if you are at high risk for osteoporosis. Discuss your test results, treatment options, and if necessary, the need for more tests with  your health care provider. Vaccines  Your health care provider may recommend certain vaccines, such as:  Influenza vaccine. This is recommended every year.  Tetanus, diphtheria, and acellular pertussis (Tdap, Td) vaccine. You may need a Td booster every 10 years.  Zoster vaccine. You may need this after age 5.  Pneumococcal 13-valent conjugate (PCV13) vaccine. You may need this if you have certain conditions and were not previously vaccinated.  Pneumococcal polysaccharide (PPSV23) vaccine. You may need one or two doses if you smoke cigarettes or if you have certain conditions. Talk to your health care provider about which screenings and vaccines you need and how often you need them. This information is not intended to replace advice given to you by your health care provider. Make sure you discuss any questions you have with your health care provider. Document Released: 02/28/2015 Document Revised: 10/22/2015 Document Reviewed: 12/03/2014 Elsevier Interactive Patient Education  2017 [place] Prevention in the Home Falls can cause injuries. They can happen to people of all ages. There are many things you can do to make your home safe and to help prevent falls. What can I do on the outside of my home?  Regularly fix the edges of walkways and driveways and fix any cracks.  Remove anything that might make you trip as you walk through a door, such as a raised step or threshold.  Trim any bushes or trees on the path to your home.  Use bright outdoor lighting.  Clear any walking paths of anything that might make someone trip, such as rocks or tools.  Regularly check to see if handrails are loose or broken. Make sure that both sides of any steps have handrails.  Any raised decks and porches should have guardrails on the edges.  Have any leaves, snow, or ice cleared regularly.  Use sand or salt on walking paths during winter.  Clean up any spills in your garage right  away. This includes oil or grease spills. What can I do in the bathroom?  Use night lights.  Install grab bars by the toilet and in the tub and shower. Do not use towel bars as grab bars.  Use non-skid mats or decals in the tub or shower.  If you need to sit down in the shower, use a plastic, non-slip stool.  Keep the floor dry. Clean up any water that spills on the floor as soon as it happens.  Remove soap buildup in the tub or shower regularly.  Attach bath mats securely with double-sided non-slip rug tape.  Do not have throw rugs and other things on the floor that can make you trip. What can I do in the bedroom?  Use night lights.  Make sure that you have a light by your bed that is easy to reach.  Do not use any sheets or blankets that are too big for your bed. They should not hang down onto the floor.  Have a firm chair that has side arms. You can use this for support while you get dressed.  Do not have throw rugs and other things on the floor that can make you trip. What can I do in the kitchen?  Clean up any spills right away.  Avoid walking on wet floors.  Keep items that you use a lot in easy-to-reach places.  If you need to reach something above you, use a strong step stool that has a grab bar.  Keep electrical cords out of the way.  Do not use floor polish or wax that makes floors slippery. If you must use wax, use non-skid floor wax.  Do not have throw rugs and other things on the floor that can make you trip. What can I do with my stairs?  Do not leave any items on the stairs.  Make sure that there are handrails on both sides of the stairs and use them. Fix handrails that are broken or loose. Make sure that handrails are as long as the stairways.  Check any carpeting to make sure that it is firmly attached to the stairs. Fix any carpet that is loose or worn.  Avoid having throw rugs at the top or bottom of the stairs. If you do have throw rugs, attach  them to the floor with carpet tape.  Make sure that you have a light switch at the top of the stairs and the bottom of the stairs. If you do not have them, ask someone to add them for you. What else can I do to help prevent falls?  Wear shoes that:  Do not have high heels.  Have rubber bottoms.  Are comfortable and fit you well.  Are closed at the toe. Do not wear sandals.  If you use a stepladder:  Make sure that it is fully opened. Do not climb a closed stepladder.  Make sure that both sides of the stepladder are locked into place.  Ask someone to hold it for you, if possible.  Clearly mark and make sure that you can see:  Any grab bars or handrails.  First and last steps.  Where the edge of each step is.  Use tools that help you move around (mobility aids) if they are needed. These include:  Canes.  Walkers.  Scooters.  Crutches.  Turn on the lights when you go into a dark area. Replace any light bulbs as soon as they burn out.  Set up your furniture so you have a clear path. Avoid moving your furniture around.  If any of your floors are uneven, fix them.  If there are any pets around you, be aware of where they are.  Review your medicines with your doctor. Some medicines can make you feel dizzy. This can increase your chance of falling. Ask your doctor what other things that you can do to help prevent falls. This information is not intended to replace advice given to you by your health care provider. Make sure you discuss any questions you have with your health care provider. Document Released: 11/28/2008 Document Revised: 07/10/2015 Document Reviewed: 03/08/2014 Elsevier Interactive Patient Education  2017 [employer].

## 2018-03-08 NOTE — Progress Notes (Addendum)
Subjective:     Patient ID: Brianna Mitchell , female    DOB: 1955/05/13 , 63 y.o.   MRN: [id number]   Chief Complaint  Patient presents with  . right shoulder pain    x 2 months, pain level 9/10, surgery on rotary nerve and swelling in arm and not able to raise arm.  Pt would like her throat checked and its swollen and hurts and causing her voice to be distorted. ? thyroid gland.  No sound from voice when she awakes.  Marland Kitchen depression score 7 but wants no treatment per pt she will be  . Hypothyroidism    HPI Pt is here for thyroid  and HTN FU. She sees her endocrinologist for DM.   2- R shoulder pain x 2 months ago, she denies injuring herself. She injured her R rotator cuff injury from a fall 2013 and does not know what caused this pain to come back. Has had 2 shoulder surgeries in the past.   3- The stomach medication she was given last visit has not helped her and would like to go see Dr Loreta Ave again.   Past Medical History:  Diagnosis Date  . Anemia   . Asthma   . Back pain   . Carpal tunnel syndrome   . Diabetes mellitus    type 2   . Fibroid tumor   . Ganglion cyst   . GERD (gastroesophageal reflux disease)   . Headache(784.0)   . Heart murmur    saw dr. Jacinto Halim (only sees if needed, not seen since 2011)  . Hypertension    dr Jacinto Halim     . Hypothyroidism   . Shortness of breath    on exertion   . Shoulder pain   . Tendonitis    left shoulder biceps tendonitis, acromioclavicular osteoarthritis  . Tennis elbow   . Thyroid disease      Family History  Problem Relation Age of Onset  . Heart disease Mother   . Heart disease Father   . Breast cancer Sister   . Heart disease Sister   . Heart disease Brother      Current Outpatient Medications:  .  albuterol (PROVENTIL HFA;VENTOLIN HFA) 108 (90 BASE) MCG/ACT inhaler, Inhale 2 puffs into the lungs every 6 (six) hours as needed for wheezing or shortness of breath. , Disp: , Rfl:  .  albuterol (PROVENTIL) (2.5 MG/3ML) 0.083%  nebulizer solution, Take 2.5 mg by nebulization every 6 (six) hours as needed for wheezing or shortness of breath. , Disp: , Rfl:  .  amitriptyline (ELAVIL) 25 MG tablet, Take 1 tablet (25 mg total) by mouth at bedtime., Disp: 30 tablet, Rfl: 6 .  beclomethasone (QVAR) 80 MCG/ACT inhaler, Inhale 1 puff into the lungs daily. , Disp: , Rfl:  .  DEXILANT 60 MG capsule, take 1 capsule by oral route every day, Disp: 90 capsule, Rfl: 0 .  HUMALOG KWIKPEN 100 UNIT/ML KiwkPen, Inject 5-12 Units into the skin 3 (three) times daily. Based on sliding scale 70-100= 5 units, 101-150 = 7 units, 151-200 = 9 units, 201-250 = 11 units, 251-300 = 13 units, 301-350 = 15 units, > 350= 18 units, Disp: , Rfl:  .  HUMULIN N KWIKPEN 100 UNIT/ML Kiwkpen, Inject 55 Units into the skin every morning. 55 units in the morning and 45 units in the evening, Disp: , Rfl:  .  hydrocortisone cream 0.5 %, Apply 1 application topically 2 (two) times daily. Apply by topical route  2 times every day a thin layer to the affected areas, Disp: , Rfl:  .  levothyroxine (SYNTHROID, LEVOTHROID) 75 MCG tablet, Take 75 mcg by mouth daily., Disp: , Rfl:  .  moxifloxacin (VIGAMOX) 0.5 % ophthalmic solution, Place 1 drop into the right eye 2 (two) times daily., Disp: , Rfl: 1 .  ondansetron (ZOFRAN-ODT) 4 MG disintegrating tablet, Take 1 tablet (4 mg total) by mouth every 8 (eight) hours as needed for nausea or vomiting., Disp: 50 tablet, Rfl: 0 .  oxyCODONE-acetaminophen (PERCOCET) 10-325 MG tablet, Take 1 tablet by mouth every 6 (six) hours as needed for pain., Disp: , Rfl:  .  oxymorphone (OPANA ER) 30 MG 12 hr tablet, Take 30 mg by mouth twice daily, Disp: , Rfl: 0 .  polyethylene glycol (MIRALAX / GLYCOLAX) packet, Take 17 g by mouth daily. (Patient taking differently: Take 17 g by mouth daily as needed for mild constipation. ), Disp: 24 each, Rfl: 0 .  rizatriptan (MAXALT-MLT) 10 MG disintegrating tablet, May repeat in 2 hours if needed. Max 2  tabs / 24 hours., Disp: 9 tablet, Rfl: 3 .  rosuvastatin (CRESTOR) 40 MG tablet, Take 40 mg by mouth daily., Disp: , Rfl:  .  topiramate (TOPAMAX) 100 MG tablet, Take 1 tablet (100 mg total) by mouth 2 (two) times daily., Disp: 180 tablet, Rfl: 4 .  Difluprednate (DUREZOL) 0.05 % EMUL, Place 1 drop into the right eye 2 (two) times daily., Disp: , Rfl:  .  EVZIO 0.4 MG/0.4ML SOAJ, Inject 0.4 mg into the muscle once. , Disp: , Rfl: 0 .  potassium chloride (KLOR-CON) 20 MEQ packet, Take 20 mEq by mouth 2 (two) times daily., Disp: , Rfl:  .  pregabalin (LYRICA) 50 MG capsule, TAKE ONE CAPSULE BY MOUTH TWICE DAILY (Patient not taking: Reported on 03/08/2018), Disp: 60 capsule, Rfl: 0   Allergies  Allergen Reactions  . Aspirin Swelling  . Sulfa Antibiotics Swelling  . Demerol Rash  . Penicillins Rash and Other (See Comments)    Has patient had a PCN reaction causing immediate rash, facial/tongue/throat swelling, SOB or lightheadedness with hypotension: No Has patient had a PCN reaction causing severe rash involving mucus membranes or skin necrosis: No Has patient had a PCN reaction that required hospitalization: Yes Has patient had a PCN reaction occurring within the last 10 years: No  If all of the above answers are "NO", then may proceed with Cephalosporin use.      Review of Systems  Constitutional: Positive for fatigue. Negative for chills, diaphoresis and fever.  HENT: Positive for trouble swallowing.        Has been getting horsed off and on in the am's.   Eyes: Negative for discharge.  Respiratory: Negative for cough and shortness of breath.   Cardiovascular: Negative for chest pain and palpitations.  Gastrointestinal: Positive for abdominal pain. Negative for abdominal distention, blood in stool, constipation and diarrhea.       L mid abd pain chronic, has had several test already.   Genitourinary: Negative for difficulty urinating.  Musculoskeletal: Positive for arthralgias, back  pain and joint swelling.       Has chronic back pain  Skin: Negative for rash.  Neurological: Positive for headaches. Negative for dizziness, syncope and speech difficulty.       Has chronic HA's and was given Topamax which she told her neurologist is no longer working and has an appointment in a few weeks.   Hematological: Negative for adenopathy.  Psychiatric/Behavioral: Positive for sleep disturbance. Negative for confusion. The patient is not nervous/anxious.        She stays up all night due to chronic  Back pain and HA's. She cat naps all day and gets total 8 hours in 24h.  States she feels happy.      Today's Vitals   03/08/18 1017  BP: (!) 142/80  Pulse: 82  Temp: 98.2 F (36.8 C)  TempSrc: Oral  SpO2: 99%  Weight: 169 lb (76.7 kg)  Height: 4\' 9"  (1.448 m)   Body mass index is 36.57 kg/m.   Objective:  Physical Exam   Constitutional: She is oriented to person, place, and time. She appears well-developed and well-nourished. No distress.  HENT:  Head: Normocephalic and atraumatic.  Right Ear: External ear normal.  Left Ear: External ear normal.  Nose: Nose normal.  Eyes: Conjunctivae are normal. Right eye exhibits no discharge. Left eye exhibits no discharge. No scleral icterus.  Neck: Neck supple. No thyromegaly present.  No carotid bruits bilaterally  Cardiovascular: Normal rate and regular rhythm.  No murmur heard. Pulmonary/Chest: Effort normal and breath sounds normal. No respiratory distress.  Musculoskeletal: Has decreased ROM of R shoulder due to pain. She exhibits no edema.  Lymphadenopathy: She has no cervical adenopathy.  Neurological: She is alert and oriented to person, place, and time.  Skin: Skin is warm and dry. Capillary refill takes less than 2 seconds. No rash noted. She is not diaphoretic.  Psychiatric: She has a normal mood and affect. Her behavior is normal. Judgment and thought content normal.  Nursing note reviewed.  Assessment And Plan:     1. Essential hypertension- stable. May continue same med. FU 3 months with Janece.  - Microalbumin, urine  2. Other insomnia- chronic. Advised to try Melatonin 3-10 mg with Magnesium 250 mg qhs. Needs to avoid screen light. See instructions.   3. Acute pain of right shoulder- I sent her back to her orthopedist.  - Ambulatory referral to Orthopedic Surgery  4. Left lower quadrant abdominal pain- chronic. Was sent back to see Dr Loreta Ave for FU.  - Ambulatory referral to Gastroenterology  5. Screening for breast cancer- screen - [facility] Diabetes Foot Exam  6. Uncontrolled type 2 diabetes mellitus with hyperglycemia (HCC)- chronic. Will continue Fu with her endocrinologist.  - [facility] Diabetes Foot Exam  7. Hypothyroidism, unspecified type- chronic - TSH - T4, Free - T3, free  8- Chronic HA's- will keep Fu with her neurologist. Advised combo Melatonin and Magnesium as mentioned above.   Florentine Diekman RODRIGUEZ-SOUTHWORTH, PA-C

## 2018-03-08 NOTE — Addendum Note (Signed)
Addended by: Kellie Simmering on: 03/08/2018 02:12 PM   Modules accepted: Orders

## 2018-03-09 DIAGNOSIS — E1165 Type 2 diabetes mellitus with hyperglycemia: Secondary | ICD-10-CM | POA: Insufficient documentation

## 2018-03-09 DIAGNOSIS — E039 Hypothyroidism, unspecified: Secondary | ICD-10-CM | POA: Insufficient documentation

## 2018-03-09 DIAGNOSIS — I10 Essential (primary) hypertension: Secondary | ICD-10-CM | POA: Insufficient documentation

## 2018-03-09 DIAGNOSIS — G4709 Other insomnia: Secondary | ICD-10-CM | POA: Insufficient documentation

## 2018-03-09 LAB — T4, FREE: Free T4: 1.15 ng/dL (ref 0.82–1.77)

## 2018-03-09 LAB — T3, FREE: T3, Free: 2.6 pg/mL (ref 2.0–4.4)

## 2018-03-09 LAB — TSH: TSH: 2.72 u[IU]/mL (ref 0.450–4.500)

## 2018-03-13 ENCOUNTER — Encounter (INDEPENDENT_AMBULATORY_CARE_PROVIDER_SITE_OTHER): Payer: Self-pay | Admitting: Orthopedic Surgery

## 2018-03-13 ENCOUNTER — Ambulatory Visit (INDEPENDENT_AMBULATORY_CARE_PROVIDER_SITE_OTHER): Payer: Medicare Other | Admitting: Orthopedic Surgery

## 2018-03-13 ENCOUNTER — Ambulatory Visit (INDEPENDENT_AMBULATORY_CARE_PROVIDER_SITE_OTHER): Payer: Self-pay

## 2018-03-13 DIAGNOSIS — R2231 Localized swelling, mass and lump, right upper limb: Secondary | ICD-10-CM | POA: Diagnosis not present

## 2018-03-13 DIAGNOSIS — M25511 Pain in right shoulder: Secondary | ICD-10-CM

## 2018-03-13 DIAGNOSIS — G8929 Other chronic pain: Secondary | ICD-10-CM | POA: Diagnosis not present

## 2018-03-13 NOTE — Progress Notes (Signed)
Office Visit Note   Patient: Brianna Mitchell           Date of Birth: 1955/02/19           MRN: [id number] Visit Date: 03/13/2018 Requested by: Dorothyann Peng, MD [address] [place], [place] [address] PCP: Dorothyann Peng, MD  Subjective: Chief Complaint  Patient presents with  . Right Shoulder - Pain    HPI: Brianna Mitchell is a patient with 36-month history of right shoulder pain.  No trauma.  She reports pain.  She is feels like there is a mass in the shoulder.  She describes limited range of motion.  No prior injections but she has had previous distal clavicle excision.              ROS: All systems reviewed are negative as they relate to the chief complaint within the history of present illness.  Patient denies  fevers or chills.   Assessment & Plan: Visit Diagnoses:  1. Chronic right shoulder pain   2. Mass of skin of right shoulder     Plan: Impression is definite asymmetry in the deltoid region right shoulder versus left.  Feels like she may have some type of cyst present within that deltoid region.  Her cuff strength is reasonable and range of motion passively is good without coarse grinding or crepitus.  No real discrete tenderness in the [facility] joint.  Plan at this time is MRI scan right shoulder evaluate deltoid mass.  I will see her back after that study.  Follow-Up Instructions: Return for after MRI.   Orders:  Orders Placed This Encounter  Procedures  . XR Shoulder Right  . MR SHOULDER RIGHT WO CONTRAST   No orders of the defined types were placed in this encounter.     Procedures: No procedures performed   Clinical Data: No additional findings.  Objective: Vital Signs: There were no vitals taken for this visit.  Physical Exam:   Constitutional: Patient appears well-developed HEENT:  Head: Normocephalic Eyes:EOM are normal Neck: Normal range of motion Cardiovascular: Normal rate Pulmonary/chest: Effort normal Neurologic: Patient is alert Skin:  Skin is warm Psychiatric: Patient has normal mood and affect    Ortho Exam: Ortho exam demonstrates full active and passive range of motion of the right shoulder.  She does have some pain in the deltoid region.  There is a fullness and cystic quality measuring about 8 by centimeters in the deltoid region right versus left.  It is subtle but definitely asymmetric.  No lymphadenopathy is present in the shoulder.  No coarse grinding or crepitus present and rotator cuff strength is good.  Specialty Comments:  No specialty comments available.  Imaging: Xr Shoulder Right  Result Date: 03/13/2018 AP lateral outlet right shoulder reviewed.  No arthritis is present.  Prior distal clavicle excision has been performed.  No significant glenohumeral arthritis or fracture is noted.  Visualized lung fields clear.    PMFS History: Patient Active Problem List   Diagnosis Date Noted  . Essential hypertension 03/09/2018  . Other insomnia 03/09/2018  . follows with endocrinology 03/09/2018  . Hypothyroidism 03/09/2018  . Controlled type 2 diabetes mellitus with hyperglycemia (HCC) 12/21/2017  . Abdominal pain 12/21/2017  . 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 Anemia   . Asthma   . Back pain   . Carpal tunnel syndrome   . Diabetes mellitus    type 2   . Fibroid tumor   .  Ganglion cyst   . GERD (gastroesophageal reflux disease)   . Headache(784.0)   . Heart murmur    saw dr. Jacinto Halim (only sees if needed, not seen since 2011)  . Hypertension    dr Jacinto Halim     . Hypothyroidism   . Shortness of breath    on exertion   . Shoulder pain   . Tendonitis    left shoulder biceps tendonitis, acromioclavicular osteoarthritis  . Tennis elbow   . Thyroid disease     Family History  Problem Relation Age of Onset  . Heart disease Mother   . Heart disease Father   . Breast cancer Sister   . Heart disease Sister   . Heart disease Brother     Past Surgical History:   Procedure Laterality Date  . ABDOMINAL HYSTERECTOMY    . ANTERIOR LAT LUMBAR FUSION N/A 01/03/2013   Procedure: X LIF (Extreme Lateral Interbody Fusion) L3-L4,  (1 LEVEL) ;  Surgeon: Venita Lick, MD;  Location: [facility] OR;  Service: Orthopedics;  Laterality: N/A;  . BACK SURGERY     x5  . CHOLECYSTECTOMY    . COLONOSCOPY  04/18/2017   Dr Charna Elizabeth ; [facility]   . EYE SURGERY  05/23/2017   lasik of right eye, to have left eye done on 06-13-17   . KNEE ARTHROSCOPY Right 04/2014  . LAPAROSCOPY N/A 05/30/2017   Procedure: LAPAROSCOPY DIAGNOSTIC;  Surgeon: Gaynelle Adu, MD;  Location: [facility] ORS;  Service: General;  Laterality: N/A;  . LEFT HEART CATHETERIZATION WITH CORONARY ANGIOGRAM N/A 04/27/2011   Procedure: LEFT HEART CATHETERIZATION WITH CORONARY ANGIOGRAM;  Surgeon: Pamella Pert, MD;  Location: [facility] CATH LAB;  Service: Cardiovascular;  Laterality: N/A;  . pelvic bone removed    . ROTATOR CUFF REPAIR Left    x3 surgeries; Dr Lorin Picket Takoda Janowiak[facility]   . SHOULDER ARTHROSCOPY WITH SUBACROMIAL DECOMPRESSION, ROTATOR CUFF REPAIR AND BICEP TENDON REPAIR Left 02/03/2016   Procedure: SHOULDER ARTHROSCOPY WITH SUBACROMIAL DECOMPRESSION, DEBRIDEMENT, BICEPS TENODESIS, DISTAL CLAVICLE EXCISION.;  Surgeon: Cammy Copa, MD;  Location: [facility] OR;  Service: Orthopedics;  Laterality: Left;  . THYROID SURGERY     LUMP REMOVED  . TUBAL LIGATION     Social History   Occupational History  . Occupation: disability  Tobacco Use  . Smoking status: Never Smoker  . Smokeless tobacco: Never Used  Substance and Sexual Activity  . Alcohol use: No    Alcohol/week: 2.0 standard drinks    Types: 2 Standard drinks or equivalent per week  . Drug use: No  . Sexual activity: Yes

## 2018-03-15 ENCOUNTER — Other Ambulatory Visit: Payer: Self-pay | Admitting: Diagnostic Neuroimaging

## 2018-03-15 LAB — LIPID PANEL
Chol/HDL Ratio: 2.1 ratio (ref 0.0–4.4)
Cholesterol, Total: 178 mg/dL (ref 100–199)
HDL: 86 mg/dL (ref >39)
LDL Calculated: 81 mg/dL (ref 0–99)
Triglycerides: 55 mg/dL (ref 0–149)
VLDL Cholesterol Cal: 11 mg/dL (ref 5–40)

## 2018-03-15 LAB — CMP14 + ANION GAP
ALT: 17 IU/L (ref 0–32)
AST: 19 IU/L (ref 0–40)
Albumin/Globulin Ratio: 1.7 (ref 1.2–2.2)
Albumin: 4.4 g/dL (ref 3.8–4.8)
Alkaline Phosphatase: 77 IU/L (ref 39–117)
Anion Gap: 18 mmol/L (ref 10.0–18.0)
BUN/Creatinine Ratio: 15 (ref 12–28)
BUN: 15 mg/dL (ref 8–27)
Bilirubin Total: <0.2 mg/dL (ref 0.0–1.2)
CO2: 18 mmol/L — ABNORMAL LOW (ref 20–29)
Calcium: 9.5 mg/dL (ref 8.7–10.3)
Chloride: 107 mmol/L — ABNORMAL HIGH (ref 96–106)
Creatinine, Ser: 0.98 mg/dL (ref 0.57–1.00)
GFR calc Af Amer: 71 mL/min/{1.73_m2} (ref >59)
GFR calc non Af Amer: 62 mL/min/{1.73_m2} (ref >59)
Globulin, Total: 2.6 g/dL (ref 1.5–4.5)
Glucose: 116 mg/dL — ABNORMAL HIGH (ref 65–99)
Potassium: 3.7 mmol/L (ref 3.5–5.2)
Sodium: 143 mmol/L (ref 134–144)
Total Protein: 7 g/dL (ref 6.0–8.5)

## 2018-03-15 LAB — SPECIMEN STATUS REPORT

## 2018-03-15 NOTE — Addendum Note (Signed)
Addended by: Nicki Guadalajara on: 03/15/2018 07:52 AM   Modules accepted: Orders

## 2018-03-16 ENCOUNTER — Encounter: Payer: Self-pay | Admitting: Nurse Practitioner

## 2018-03-16 ENCOUNTER — Other Ambulatory Visit: Payer: Self-pay | Admitting: Internal Medicine

## 2018-03-16 MED ORDER — DESLORATADINE 5 MG PO TABS: 5.0000 mg | ORAL_TABLET | Freq: Every day | ORAL | 0 refills | Status: DC

## 2018-03-16 NOTE — Progress Notes (Signed)
I sent Clarinex for allergies since allegra, claritin or zyrtec do not help

## 2018-03-23 ENCOUNTER — Ambulatory Visit: Payer: Medicare Other | Admitting: Registered"

## 2018-03-23 ENCOUNTER — Ambulatory Visit
Admission: RE | Admit: 2018-03-23 | Discharge: 2018-03-23 | Disposition: A | Discharge status: Home / Self Care | Payer: Medicare Other | Source: Ambulatory Visit | Attending: Orthopedic Surgery | Admitting: Orthopedic Surgery

## 2018-03-23 DIAGNOSIS — M25511 Pain in right shoulder: Principal | ICD-10-CM

## 2018-03-23 DIAGNOSIS — G8929 Other chronic pain: Secondary | ICD-10-CM

## 2018-03-27 ENCOUNTER — Ambulatory Visit (INDEPENDENT_AMBULATORY_CARE_PROVIDER_SITE_OTHER): Payer: Medicare Other | Admitting: Orthopedic Surgery

## 2018-03-27 ENCOUNTER — Encounter (INDEPENDENT_AMBULATORY_CARE_PROVIDER_SITE_OTHER): Payer: Self-pay | Admitting: Orthopedic Surgery

## 2018-03-27 VITALS — Ht <= 58 in | Wt 169.0 lb

## 2018-03-27 DIAGNOSIS — M75121 Complete rotator cuff tear or rupture of right shoulder, not specified as traumatic: Secondary | ICD-10-CM

## 2018-03-28 NOTE — Pre-Procedure Instructions (Addendum)
Brianna Mitchell  03/28/2018      [place], [place]  [address] Phone: [phone] Fax: [place], [place], [place]  [address] Phone: [phone] Fax: [phone]    Your procedure is scheduled on February 18  Report to [facility] Admitting at 0900 A.M.  Call this number if you have problems the morning of surgery:  [phone]   Remember:  Do not eat or drink after midnight.    Take these medicines the morning of surgery with A SIP OF WATER  albuterol (PROVENTIL HFA;VENTOLIN HFA) albuterol (PROVENTIL) (2.5 MG/3ML)   beclomethasone (QVAR) DEXILANT levothyroxine (SYNTHROID, LEVOTHROID oxyCODONE-acetaminophen (PERCOCET) Eye drops if needed topiramate (TOPAMAX) rizatriptan (MAXALT-MLT) if needed  Bring all inhalers with you the day of surgery  7 days prior to surgery STOP taking any Aspirin (unless otherwise instructed by your surgeon), Aleve, Naproxen, Ibuprofen, Motrin, Advil, Goody's, BC's, all herbal medications, fish oil, and all vitamins.   WHAT DO I DO ABOUT MY DIABETES MEDICATION?   Marland Kitchen Do not take oral diabetes medicines (pills) the morning of surgery.  . THE NIGHT BEFORE SURGERY, take ______22_____ units of __HUMULIN N KWIKPEN_________insulin.       . THE MORNING OF SURGERY, take ________27_____ units of __HUMULIN N KWIKPEN________insulin.  . The day of surgery, do not take other diabetes injectables, including Byetta (exenatide), Bydureon (exenatide ER), Victoza (liraglutide), or Trulicity (dulaglutide).  . If your CBG is greater than 220 mg/dL, you may take  of your sliding scale (correction) dose of insulin. HUMALOG KWIKPEN    How to Manage Your Diabetes Before and After Surgery  Why is it important to control my blood sugar before and after surgery? . Improving blood sugar levels  before and after surgery helps healing and can limit problems. . A way of improving blood sugar control is eating a healthy diet by: o  Eating less sugar and carbohydrates o  Increasing activity/exercise o  Talking with your doctor about reaching your blood sugar goals . High blood sugars (greater than 180 mg/dL) can raise your risk of infections and slow your recovery, so you will need to focus on controlling your diabetes during the weeks before surgery. . Make sure that the doctor who takes care of your diabetes knows about your planned surgery including the date and location.  How do I manage my blood sugar before surgery? . Check your blood sugar at least 4 times a day, starting 2 days before surgery, to make sure that the level is not too high or low. o Check your blood sugar the morning of your surgery when you wake up and every 2 hours until you get to the Short Stay unit. . If your blood sugar is less than 70 mg/dL, you will need to treat for low blood sugar: o Do not take insulin. o Treat a low blood sugar (less than 70 mg/dL) with  cup of clear juice (cranberry or apple), 4 glucose tablets, OR glucose gel. o Recheck blood sugar in 15 minutes after treatment (to make sure it is greater than 70 mg/dL). If your blood sugar is not greater than 70 mg/dL on recheck, call [phone] for further instructions. . Report your blood sugar to the short stay nurse when you get to Short Stay.  . If you are admitted to the hospital after  surgery: o Your blood sugar will be checked by the staff and you will probably be given insulin after surgery (instead of oral diabetes medicines) to make sure you have good blood sugar levels. o The goal for blood sugar control after surgery is 80-180 mg/dL.   Do not wear jewelry, make-up or nail polish.  Do not wear lotions, powders, or perfumes, or deodorant.  Do not shave 48 hours prior to surgery.  Men may shave face and neck.  Do not bring valuables to  the hospital.  [facility] is not responsible for any belongings or valuables.  Contacts, dentures or bridgework may not be worn into surgery.  Leave your suitcase in the car.  After surgery it may be brought to your room.  For patients admitted to the hospital, discharge time will be determined by your treatment team.  Patients discharged the day of surgery will not be allowed to drive home.    Special instructions:   [facility]- Preparing For Surgery  Before surgery, you can play an important role. Because skin is not sterile, your skin needs to be as free of germs as possible. You can reduce the number of germs on your skin by washing with CHG (chlorahexidine gluconate) Soap before surgery.  CHG is an antiseptic cleaner which kills germs and bonds with the skin to continue killing germs even after washing.    Oral Hygiene is also important to reduce your risk of infection.  Remember - BRUSH YOUR TEETH THE MORNING OF SURGERY WITH YOUR REGULAR TOOTHPASTE  Please do not use if you have an allergy to CHG or antibacterial soaps. If your skin becomes reddened/irritated stop using the CHG.  Do not shave (including legs and underarms) for at least 48 hours prior to first CHG shower. It is OK to shave your face.  Please follow these instructions carefully.   1. Shower the NIGHT BEFORE SURGERY and the MORNING OF SURGERY with CHG.   2. If you chose to wash your hair, wash your hair first as usual with your normal shampoo.  3. After you shampoo, rinse your hair and body thoroughly to remove the shampoo.  4. Use CHG as you would any other liquid soap. You can apply CHG directly to the skin and wash gently with a scrungie or a clean washcloth.   5. Apply the CHG Soap to your body ONLY FROM THE NECK DOWN.  Do not use on open wounds or open sores. Avoid contact with your eyes, ears, mouth and genitals (private parts). Wash Face and genitals (private parts)  with your normal soap.  6. Wash  thoroughly, paying special attention to the area where your surgery will be performed.  7. Thoroughly rinse your body with warm water from the neck down.  8. DO NOT shower/wash with your normal soap after using and rinsing off the CHG Soap.  9. Pat yourself dry with a CLEAN TOWEL.  10. Wear CLEAN PAJAMAS to bed the night before surgery, wear comfortable clothes the morning of surgery  11. Place CLEAN SHEETS on your bed the night of your first shower and DO NOT SLEEP WITH PETS.    Day of Surgery:  Do not apply any deodorants/lotions.  Please wear clean clothes to the hospital/surgery center.   Remember to brush your teeth WITH YOUR REGULAR TOOTHPASTE.    Please read over the following fact sheets that you were given.

## 2018-03-29 ENCOUNTER — Other Ambulatory Visit: Payer: Self-pay

## 2018-03-29 ENCOUNTER — Encounter (INDEPENDENT_AMBULATORY_CARE_PROVIDER_SITE_OTHER): Payer: Self-pay | Admitting: Orthopedic Surgery

## 2018-03-29 ENCOUNTER — Encounter (HOSPITAL_COMMUNITY)
Admission: RE | Admit: 2018-03-29 | Discharge: 2018-03-29 | Disposition: A | Discharge status: Home / Self Care | Payer: Medicare Other | Source: Ambulatory Visit | Attending: Orthopedic Surgery | Admitting: Orthopedic Surgery

## 2018-03-29 ENCOUNTER — Encounter (HOSPITAL_COMMUNITY): Payer: Self-pay | Admitting: Physician Assistant

## 2018-03-29 ENCOUNTER — Encounter (HOSPITAL_COMMUNITY): Payer: Self-pay

## 2018-03-29 ENCOUNTER — Other Ambulatory Visit (INDEPENDENT_AMBULATORY_CARE_PROVIDER_SITE_OTHER): Payer: Self-pay | Admitting: Orthopedic Surgery

## 2018-03-29 DIAGNOSIS — Z01812 Encounter for preprocedural laboratory examination: Secondary | ICD-10-CM | POA: Insufficient documentation

## 2018-03-29 DIAGNOSIS — M75121 Complete rotator cuff tear or rupture of right shoulder, not specified as traumatic: Secondary | ICD-10-CM

## 2018-03-29 LAB — CBC
HCT: 35.7 % — ABNORMAL LOW (ref 36.0–46.0)
Hemoglobin: 10.9 g/dL — ABNORMAL LOW (ref 12.0–15.0)
MCH: 27.8 pg (ref 26.0–34.0)
MCHC: 30.5 g/dL (ref 30.0–36.0)
MCV: 91.1 fL (ref 80.0–100.0)
Platelets: 281 10*3/uL (ref 150–400)
RBC: 3.92 MIL/uL (ref 3.87–5.11)
RDW: 14.6 % (ref 11.5–15.5)
WBC: 7.9 10*3/uL (ref 4.0–10.5)
nRBC: 0 % (ref 0.0–0.2)

## 2018-03-29 LAB — HEMOGLOBIN A1C
Hgb A1c MFr Bld: 9.4 % — ABNORMAL HIGH (ref 4.8–5.6)
Mean Plasma Glucose: 223.08 mg/dL

## 2018-03-29 LAB — BASIC METABOLIC PANEL
Anion gap: 11 (ref 5–15)
BUN: 20 mg/dL (ref 8–23)
CO2: 21 mmol/L — ABNORMAL LOW (ref 22–32)
Calcium: 9.3 mg/dL (ref 8.9–10.3)
Chloride: 108 mmol/L (ref 98–111)
Creatinine, Ser: 1.22 mg/dL — ABNORMAL HIGH (ref 0.44–1.00)
GFR calc Af Amer: 55 mL/min — ABNORMAL LOW (ref >60)
GFR calc non Af Amer: 47 mL/min — ABNORMAL LOW (ref >60)
Glucose, Bld: 66 mg/dL — ABNORMAL LOW (ref 70–99)
Potassium: 3.5 mmol/L (ref 3.5–5.1)
Sodium: 140 mmol/L (ref 135–145)

## 2018-03-29 LAB — GLUCOSE, CAPILLARY: Glucose-Capillary: 161 mg/dL — ABNORMAL HIGH (ref 70–99)

## 2018-03-29 NOTE — Progress Notes (Signed)
Office Visit Note   Patient: Brianna Mitchell           Date of Birth: 03-Nov-1955           MRN: [id number] Visit Date: 03/27/2018 Requested by: Dorothyann Peng, MD [address] [place], [place] [address] PCP: Dorothyann Peng, MD  Subjective: Chief Complaint  Patient presents with  . Right Shoulder - Pain, Follow-up    MRI Right Shoulder Review    HPI: Patient presents for follow-up of right shoulder MRI scan.  She does have a full-thickness minimally retracted rotator cuff tear.  History of prior acromioplasty and arthroscopic surgery on that right shoulder.  Not really taking too much for pain.  She does have continued symptoms though worse with overhead and also reports weakness in that right shoulder.  Has had a history of left shoulder rotator cuff surgery.              ROS: All systems reviewed are negative as they relate to the chief complaint within the history of present illness.  Patient denies  fevers or chills.   Assessment & Plan: Visit Diagnoses:  1. Nontraumatic complete tear of right rotator cuff     Plan: Impression is right shoulder rotator cuff tear refractory to nonoperative management.  Does not look like she has frozen shoulder.  She would like to proceed with operative management of that rotator cuff tear.  I think that could be done through a mini open approach.  Risk and benefits are discussed including not limited to infection nerve vessel damage shoulder stiffness incomplete pain relief and incomplete restoration of function.  Patient understands risk benefits.  All questions answered  Follow-Up Instructions: No follow-ups on file.   Orders:  No orders of the defined types were placed in this encounter.  No orders of the defined types were placed in this encounter.     Procedures: No procedures performed   Clinical Data: No additional findings.  Objective: Vital Signs: Ht 4\' 9"  (1.448 m)   Wt 169 lb (76.7 kg)   BMI 36.57 kg/m    Physical Exam:   Constitutional: Patient appears well-developed HEENT:  Head: Normocephalic Eyes:EOM are normal Neck: Normal range of motion Cardiovascular: Normal rate Pulmonary/chest: Effort normal Neurologic: Patient is alert Skin: Skin is warm Psychiatric: Patient has normal mood and affect    Ortho Exam: Ortho exam demonstrates good cervical spine range of motion.  Patient has some coarseness and grinding with passive range of motion of the right and to a lesser degree left shoulder.  Some infraspinatus supraspinatus weakness on the right compared to left.  No masses lymphadenopathy or skin changes noted in that shoulder girdle region.  No discrete tenderness in the [facility] joint present.  Specialty Comments:  No specialty comments available.  Imaging: No results found.   PMFS History: Patient Active Problem List   Diagnosis Date Noted  . Essential hypertension 03/09/2018  . Other insomnia 03/09/2018  . follows with endocrinology 03/09/2018  . Hypothyroidism 03/09/2018  . Controlled type 2 diabetes mellitus with hyperglycemia (HCC) 12/21/2017  . Abdominal pain 12/21/2017  . 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 Anemia   . Asthma   . Back pain   . Carpal tunnel syndrome   . Diabetes mellitus    type 2   . Fibroid tumor   . Ganglion cyst   . GERD (gastroesophageal reflux disease)   . Headache(784.0)   .  Heart murmur    saw dr. Jacinto Halim (only sees if needed, not seen since 2011)  . Hypertension    dr Jacinto Halim     . Hypothyroidism   . Shortness of breath    on exertion   . Shoulder pain   . Tendonitis    left shoulder biceps tendonitis, acromioclavicular osteoarthritis  . Tennis elbow   . Thyroid disease     Family History  Problem Relation Age of Onset  . Heart disease Mother   . Heart disease Father   . Breast cancer Sister   . Heart disease Sister   . Heart disease Brother     Past Surgical History:  Procedure  Laterality Date  . ABDOMINAL HYSTERECTOMY    . ANTERIOR LAT LUMBAR FUSION N/A 01/03/2013   Procedure: X LIF (Extreme Lateral Interbody Fusion) L3-L4,  (1 LEVEL) ;  Surgeon: Venita Lick, MD;  Location: [facility] OR;  Service: Orthopedics;  Laterality: N/A;  . BACK SURGERY     x5  . CHOLECYSTECTOMY    . COLONOSCOPY  04/18/2017   Dr Charna Elizabeth ; [facility]   . EYE SURGERY  05/23/2017   lasik of right eye, to have left eye done on 06-13-17   . KNEE ARTHROSCOPY Right 04/2014  . LAPAROSCOPY N/A 05/30/2017   Procedure: LAPAROSCOPY DIAGNOSTIC;  Surgeon: Gaynelle Adu, MD;  Location: [facility] ORS;  Service: General;  Laterality: N/A;  . LEFT HEART CATHETERIZATION WITH CORONARY ANGIOGRAM N/A 04/27/2011   Procedure: LEFT HEART CATHETERIZATION WITH CORONARY ANGIOGRAM;  Surgeon: Pamella Pert, MD;  Location: [facility] CATH LAB;  Service: Cardiovascular;  Laterality: N/A;  . pelvic bone removed    . ROTATOR CUFF REPAIR Left    x3 surgeries; Dr Lorin Picket [place]   . SHOULDER ARTHROSCOPY WITH SUBACROMIAL DECOMPRESSION, ROTATOR CUFF REPAIR AND BICEP TENDON REPAIR Left 02/03/2016   Procedure: SHOULDER ARTHROSCOPY WITH SUBACROMIAL DECOMPRESSION, DEBRIDEMENT, BICEPS TENODESIS, DISTAL CLAVICLE EXCISION.;  Surgeon: Cammy Copa, MD;  Location: [facility] OR;  Service: Orthopedics;  Laterality: Left;  . THYROID SURGERY     LUMP REMOVED  . TUBAL LIGATION     Social History   Occupational History  . Occupation: disability  Tobacco Use  . Smoking status: Never Smoker  . Smokeless tobacco: Never Used  Substance and Sexual Activity  . Alcohol use: No    Alcohol/week: 2.0 standard drinks    Types: 2 Standard drinks or equivalent per week  . Drug use: No  . Sexual activity: Yes

## 2018-03-29 NOTE — Progress Notes (Addendum)
PCP -[facility]  Cardiologist - Cath.- by Einar Gip  Chest x-ray - n/a EKG - 05/2017 Stress Test -  ECHO - 10/11 Cardiac Cath - 2013  Sleep Study -n/a  CPAP - n/a  Fasting Blood Sugar - 161 this a.m.  Checks Blood Sugar __3___ times a day  Blood Thinner Instructions:n/a Aspirin Instructions:allergy  Anesthesia review: due to lab results- chart will be given to anesth. For review  Patient denies shortness of breath, fever, cough and chest pain at PAT appointment   Patient verbalized understanding of instructions that were given to them at the PAT appointment. Patient was also instructed that they will need to review over the PAT instructions again at home before surgery.  Pt. Followed by Dr. Chalmers Cater for DM.  Pt. Reports that she has seen Dr, Einar Gip in the past- & thinks that she will see him again in the near future . Call to Dr. Irven Shelling office, pt. Last seen there in 2013, no appt. scheduled for in the future- spoke with [facility].

## 2018-03-29 NOTE — Progress Notes (Signed)
Please call patient with results. Thanks her hemoglobin A1c is too high for elective surgery.  She needs to get that down under 8 before we can do it please call thanks

## 2018-03-29 NOTE — Progress Notes (Signed)
Spoke with Lorretta Harp, PA, regarding Lab results seen for today.

## 2018-03-30 NOTE — Progress Notes (Signed)
Ok for t 3 1 po q 8  - 12 # 30

## 2018-03-31 ENCOUNTER — Other Ambulatory Visit (INDEPENDENT_AMBULATORY_CARE_PROVIDER_SITE_OTHER): Payer: Self-pay | Admitting: Radiology

## 2018-03-31 MED ORDER — ACETAMINOPHEN-CODEINE #3 300-30 MG PO TABS: ORAL_TABLET | ORAL | 0 refills | Status: DC

## 2018-04-03 ENCOUNTER — Telehealth (INDEPENDENT_AMBULATORY_CARE_PROVIDER_SITE_OTHER): Payer: Self-pay | Admitting: Orthopedic Surgery

## 2018-04-03 NOTE — Telephone Encounter (Signed)
Please advise. Thanks.  

## 2018-04-03 NOTE — Telephone Encounter (Signed)
Sure pls calal thx

## 2018-04-03 NOTE — Telephone Encounter (Signed)
Patient called asked if she can get her lab work done at Dr Randel Pigg office prior to her surgery in May? (A1C) check  The number to contact patient is [phone]

## 2018-04-04 ENCOUNTER — Encounter (HOSPITAL_COMMUNITY): Admission: RE | Payer: Self-pay | Source: Ambulatory Visit

## 2018-04-04 ENCOUNTER — Ambulatory Visit (HOSPITAL_COMMUNITY): Admission: RE | Admit: 2018-04-04 | Payer: Medicare Other | Source: Ambulatory Visit | Admitting: Orthopedic Surgery

## 2018-04-04 SURGERY — SHOULDER ARTHROSCOPY WITH SUBACROMIAL DECOMPRESSION, ROTATOR CUFF REPAIR AND BICEP TENDON REPAIR
Anesthesia: General | Laterality: Right

## 2018-04-04 NOTE — Telephone Encounter (Signed)
IC s/w patient. Advised. She stated she will call closer to her surgery date to schedule appt for labs to have A1C checked.

## 2018-04-11 ENCOUNTER — Ambulatory Visit: Payer: Medicare Other

## 2018-04-12 ENCOUNTER — Inpatient Hospital Stay (INDEPENDENT_AMBULATORY_CARE_PROVIDER_SITE_OTHER): Payer: Medicare Other | Admitting: Orthopedic Surgery

## 2018-04-17 ENCOUNTER — Telehealth (INDEPENDENT_AMBULATORY_CARE_PROVIDER_SITE_OTHER): Payer: Self-pay | Admitting: Radiology

## 2018-04-17 NOTE — Telephone Encounter (Signed)
Patient left voicemail and would like to schedule surgery sooner due to being in so much pain. She requests a call back at [phone].

## 2018-05-01 ENCOUNTER — Other Ambulatory Visit: Payer: Self-pay | Admitting: Diagnostic Neuroimaging

## 2018-05-01 NOTE — Telephone Encounter (Signed)
Refilled maxalt x 1 month. Patient needs to keep FU in May.

## 2018-05-17 ENCOUNTER — Ambulatory Visit: Payer: Medicare Other | Admitting: Nurse Practitioner

## 2018-05-29 ENCOUNTER — Other Ambulatory Visit: Payer: Self-pay | Admitting: Diagnostic Neuroimaging

## 2018-05-29 NOTE — Telephone Encounter (Signed)
Amitriptyline refilled x 2 months with note to pharmacy: patient must keep May appointment.

## 2018-06-19 ENCOUNTER — Telehealth: Payer: Self-pay | Admitting: Nurse Practitioner

## 2018-06-19 NOTE — Telephone Encounter (Signed)
Pt agreed to phone visit.

## 2018-06-20 ENCOUNTER — Telehealth: Payer: Self-pay | Admitting: *Deleted

## 2018-06-20 ENCOUNTER — Encounter: Payer: Self-pay | Admitting: *Deleted

## 2018-06-20 ENCOUNTER — Telehealth: Payer: Self-pay | Admitting: Orthopedic Surgery

## 2018-06-20 NOTE — Telephone Encounter (Signed)
Please advise. I did run narcotic search #120 oxy 10/325 on 05/04 by Dr Nelva Bush #60 oxymorphone 30mg  on 04/13 by Levy Pupa PA-C #128 oxy 10/325 on 04/07 by Levy Pupa PA-C

## 2018-06-20 NOTE — Telephone Encounter (Signed)
Called patient and advised her that due to current COVID 19 pandemic, our office is severely reducing in person visits in order to minimize the risk to our patients and healthcare providers. We recommend to convert your appointment to a video visit. We'll take all precautions to reduce any security or privacy concerns. This will be treated like an office visit, and we will file with your insurance. She consented to video visit. Her e mail is [email or web address]. We rescheduled her for sooner, updated EMR. E mail sent.

## 2018-06-20 NOTE — Telephone Encounter (Signed)
Patient requesting something for pain for the R shoulder.  Wants to have surgery but is too afraid to schedule anything right now.  She states her arm has had some tingling and it sometimes goes down into the hand.  She says she was takingTylenol w/codeine.   Uses [facility]

## 2018-06-21 NOTE — Telephone Encounter (Signed)
IC s/w patient and advised. She verbalized understanding.  

## 2018-06-21 NOTE — Telephone Encounter (Signed)
Great job that would be a no on more pain meds

## 2018-06-22 ENCOUNTER — Other Ambulatory Visit: Payer: Self-pay

## 2018-06-22 ENCOUNTER — Ambulatory Visit: Payer: Medicare Other | Admitting: Diagnostic Neuroimaging

## 2018-06-22 NOTE — Telephone Encounter (Signed)
Called patient to discuss why she was unable to connect to [email or web address].  She thought the provider was going to call her. Explained process to her again, and that I sent e mail to 2 different e mails, verified I had used correct e mail the 2nd time. After discussion we decided to reschedule her for a tele visit next Monday, and she  verbalized understanding, appreciation.

## 2018-06-26 ENCOUNTER — Telehealth: Payer: Self-pay | Admitting: *Deleted

## 2018-06-26 ENCOUNTER — Other Ambulatory Visit: Payer: Self-pay

## 2018-06-26 ENCOUNTER — Ambulatory Visit (INDEPENDENT_AMBULATORY_CARE_PROVIDER_SITE_OTHER): Payer: Medicare Other | Admitting: Diagnostic Neuroimaging

## 2018-06-26 DIAGNOSIS — G43019 Migraine without aura, intractable, without status migrainosus: Secondary | ICD-10-CM | POA: Diagnosis not present

## 2018-06-26 MED ORDER — ERENUMAB-AOOE 70 MG/ML ~~LOC~~ SOAJ: 70.0000 mg | SUBCUTANEOUS | 4 refills | Status: DC

## 2018-06-26 MED ORDER — RIZATRIPTAN BENZOATE 10 MG PO TBDP: ORAL_TABLET | ORAL | 12 refills | Status: DC

## 2018-06-26 MED ORDER — TOPIRAMATE 100 MG PO TABS: 100.0000 mg | ORAL_TABLET | Freq: Two times a day (BID) | ORAL | 4 refills | Status: DC

## 2018-06-26 NOTE — Progress Notes (Signed)
° ° ° °  Virtual Visit via Telephone Note  I connected with@ on 06/26/18 at  1:00 PM EDT by telephone and verified that I am speaking with the correct person using two identifiers.   I discussed the limitations, risks, security and privacy concerns of performing an evaluation and management service by telephone and the availability of in person appointments. I also discussed with the patient that there may be a patient responsible charge related to this service. The patient expressed understanding and agreed to proceed. Patient is at home and I am at the office.   History of Present Illness:  UPDATE (06/26/18, VRP): Since last visit, doing poorly. HA symptoms are continuing. Severity is high. No alleviating or aggravating factors. Tolerating meds. More stress. Had 3 family members die of COVID 61 (living in [place] and [place]).    UPDATE (11/16/16, VRP): Since last visit, doing poorly. More back pain, headaches, insomnia. Tolerating topiramate and rizatriptan.   UPDATE 07/29/15: Since last visit, symptoms stable. Daily HA + intermittent headaches. Patient had to cancel sleep study due to family issues.   UPDATE 01/01/15: Since last visit, still with daily HA. Also with 5 severe migraines per week.  PRIOR HPI (03/12/14): 63 year old ambidextrous female here for evaluation of headaches. Since age 47 years old patient has had intermittent global, severe throbbing headaches, with blurred vision, irritability, nausea and photophobia and phonophobia. Around 2008 patient was evaluated by headache and wellness center, dx'd with migraine, and prescribed topiramate 25 mg twice a day. Patient then followed up with PCP who increased this to 50 mg twice a day for past 2-3 yrs. Patient continues to have multiple low-grade headaches per day. She has 3 severe migraine headaches per week. She typically tries to calm herself, relax, sometimes takes half tablet of generic Excedrin Migraine tablet. She reports allergy to aspirin  and this medication has aspirin in it. She reports some itching sensation when she takes it. Patient reports remote sleep study testing more than 10 years ago but does not know the results. She is not on CPAP therapy. She has significant insomnia which she feels is related to her chronic back pain. She has snoring, obesity, HTN  and daytime sleepiness. No specific other triggering factors for headache.    Observations/Objective:  - awake, alert   Assessment and Plan:  63 y.o. female here with migraine without aura since age 69 years old. Headaches are aggravated by insomnia, chronic back pain, family stress issues.  Dx:  1. Intractable migraine without aura and without status migrainosus     - stop amitriptyline - continue topiramate 100mg  twice a day + tylenol as needed + rizatriptan as needed - start aimovig injections (monthly) - encouraged patient to improve nutrition and sleep pattern   Follow Up Instructions:  - Return in about 4 months (around 10/27/2018) for with NP (Amy Lomax).    I discussed the assessment and treatment plan with the patient. The patient was provided an opportunity to ask questions and all were answered. The patient agreed with the plan and demonstrated an understanding of the instructions.   The patient was advised to call back or seek an in-person evaluation if the symptoms worsen or if the condition fails to improve as anticipated.  I provided 15 minutes of non-face-to-face time during this encounter.    Penni Bombard, MD 3/76/2831, 5:17 PM Certified in Neurology, Neurophysiology and Neuroimaging  [facility] [address], [place], [place] [address] [phone]

## 2018-06-26 NOTE — Telephone Encounter (Signed)
Started Aimovig PA on CMM, key: [id number]. Dx: G 8. 019. Failed: topiramate, amitriptyline, allergic: ASA. Clinical questions answered.  Information sent to [facility].

## 2018-06-27 NOTE — Telephone Encounter (Addendum)
[facility]: Aimovig approved through 02/15/2019. Pt ID: [id number], GPI/NDC: [id number], Ref PA# [id number]. Approval letter faxed to [place].

## 2018-07-03 ENCOUNTER — Ambulatory Visit (INDEPENDENT_AMBULATORY_CARE_PROVIDER_SITE_OTHER): Payer: Medicare Other | Admitting: Nurse Practitioner

## 2018-07-03 ENCOUNTER — Other Ambulatory Visit: Payer: Self-pay

## 2018-07-03 ENCOUNTER — Encounter: Payer: Self-pay | Admitting: Nurse Practitioner

## 2018-07-03 DIAGNOSIS — E039 Hypothyroidism, unspecified: Secondary | ICD-10-CM

## 2018-07-03 DIAGNOSIS — Z794 Long term (current) use of insulin: Secondary | ICD-10-CM

## 2018-07-03 DIAGNOSIS — R109 Unspecified abdominal pain: Secondary | ICD-10-CM | POA: Diagnosis not present

## 2018-07-03 DIAGNOSIS — I10 Essential (primary) hypertension: Secondary | ICD-10-CM

## 2018-07-03 DIAGNOSIS — E1165 Type 2 diabetes mellitus with hyperglycemia: Secondary | ICD-10-CM

## 2018-07-03 DIAGNOSIS — G8929 Other chronic pain: Secondary | ICD-10-CM

## 2018-07-03 DIAGNOSIS — M25511 Pain in right shoulder: Secondary | ICD-10-CM

## 2018-07-03 MED ORDER — LACTOBACILLUS PO TABS: 1.0000 | ORAL_TABLET | Freq: Every day | ORAL | 1 refills | Status: DC

## 2018-07-03 NOTE — Progress Notes (Signed)
Virtual Visit via Telephone     This visit type was conducted due to national recommendations for restrictions regarding the COVID-19 Pandemic (e.g. social distancing) in an effort to limit this patient's exposure and mitigate transmission in our community.  Patients identity confirmed using two different identifiers.  This format is felt to be most appropriate for this patient at this time.  All issues noted in this document were discussed and addressed.  No physical exam was performed (except for noted visual exam findings with Video Visits).    Date:  07/13/2018   ID:  Brianna Mitchell, DOB 20-Apr-1955, MRN [id number]  Patient Location:  Home - spoke with Brianna Mitchell  Provider location:   Office   Chief Complaint:  Diabetes follow up  History of Present Illness:    Brianna Mitchell is a 63 y.o. female who presents via video conferencing for a telehealth visit today.    The patient does not have symptoms concerning for COVID-19 infection (fever, chills, cough, or new shortness of breath).   Humulin - 55 units am and 30 units night, Humalog sliding scale - Dr. Chalmers Cater - goes back on 07/19/2018 - she was placed on a new sliding scale.  She could not have her surgery due to elevated HgbA1c.  Last was 9.4.  She tells me she is not eating breakfast drinking Ensure and Glucerna. (she had been going to a nutritionist).   Changed to topamax to amoivig - just started on Friday.  The other headache medication just wasn't working.    She is to get her right shoulder surgery rescheduled.  She continues to go to pain clinic Dr. Venda Rodes on 06/29/2018.  Rescheduled her appt and will be having an injection, they will let her know what day to have the injection.    Diabetes  She presents for her follow-up diabetic visit. She has type 2 diabetes mellitus. There are no hypoglycemic associated symptoms. Pertinent negatives for hypoglycemia include no confusion or dizziness. There are no diabetic associated  symptoms. Pertinent negatives for diabetes include no blurred vision and no chest pain. There are no hypoglycemic complications. Symptoms are stable. There are no diabetic complications. Risk factors for coronary artery disease include obesity and sedentary lifestyle.     Past Medical History:  Diagnosis Date  . Anemia   . Asthma   . Back pain   . Carpal tunnel syndrome   . Diabetes mellitus    type 2   . Fibroid tumor   . Ganglion cyst   . GERD (gastroesophageal reflux disease)    will see Dr. Tammi Klippel 04/2018  . Headache(784.0)   . Heart murmur    saw dr. Einar Gip (only sees if needed, not seen since 2011)  . Hypertension    dr Einar Gip     . Hypothyroidism   . Shortness of breath    on exertion   . Shoulder pain   . Tendonitis    left shoulder biceps tendonitis, acromioclavicular osteoarthritis  . Tennis elbow   . Thyroid disease    Past Surgical History:  Procedure Laterality Date  . ABDOMINAL HYSTERECTOMY    . ANTERIOR LAT LUMBAR FUSION N/A 01/03/2013   Procedure: X LIF (Extreme Lateral Interbody Fusion) L3-L4,  (1 LEVEL) ;  Surgeon: Melina Schools, MD;  Location: [place];  Service: Orthopedics;  Laterality: N/A;  . BACK SURGERY     x5  . CARDIAC CATHETERIZATION    . CHOLECYSTECTOMY    . COLONOSCOPY  04/18/2017  Dr Juanita Craver ; [place]   . EYE SURGERY  05/23/2017   laser of right eye, to have left eye done on 06-13-17 - for cataracts   . KNEE ARTHROSCOPY Right 04/2014  . LAPAROSCOPY N/A 05/30/2017   Procedure: LAPAROSCOPY DIAGNOSTIC;  Surgeon: Greer Pickerel, MD;  Location: [facility];  Service: General;  Laterality: N/A;  . LEFT HEART CATHETERIZATION WITH CORONARY ANGIOGRAM N/A 04/27/2011   Procedure: LEFT HEART CATHETERIZATION WITH CORONARY ANGIOGRAM;  Surgeon: Laverda Page, MD;  Location: [facility];  Service: Cardiovascular;  Laterality: N/A;  . pelvic bone removed    . ROTATOR CUFF REPAIR Left    x3 surgeries; Dr Nicki Reaper [facility]   .  SHOULDER ARTHROSCOPY WITH SUBACROMIAL DECOMPRESSION, ROTATOR CUFF REPAIR AND BICEP TENDON REPAIR Left 02/03/2016   Procedure: SHOULDER ARTHROSCOPY WITH SUBACROMIAL DECOMPRESSION, DEBRIDEMENT, BICEPS TENODESIS, DISTAL CLAVICLE EXCISION.;  Surgeon: Meredith Pel, MD;  Location: [place];  Service: Orthopedics;  Laterality: Left;  . THYROID SURGERY     LUMP REMOVED  . TUBAL LIGATION       Current Meds  Medication Sig  . albuterol (PROVENTIL HFA;VENTOLIN HFA) 108 (90 BASE) MCG/ACT inhaler Inhale 2 puffs into the lungs every 6 (six) hours as needed for wheezing or shortness of breath.   Marland Kitchen albuterol (PROVENTIL) (2.5 MG/3ML) 0.083% nebulizer solution Take 2.5 mg by nebulization every 6 (six) hours as needed for wheezing or shortness of breath.   . beclomethasone (QVAR) 80 MCG/ACT inhaler Inhale 1 puff into the lungs daily.   Marland Kitchen DEXILANT 60 MG capsule take 1 capsule by oral route every day (Patient taking differently: Take 60 mg by mouth daily. )  . Erenumab-aooe (AIMOVIG) 70 MG/ML SOAJ Inject 70 mg into the skin every 30 (thirty) days.  Marland Kitchen HUMALOG KWIKPEN 100 UNIT/ML KiwkPen Inject 5-18 Units into the skin 3 (three) times daily. Based on sliding scale 70-100= 5 units, 101-150 = 7 units, 151-200 = 9 units, 201-250 = 11 units, 251-300 = 13 units, 301-350 = 15 units, > 350= 18 units  . HUMULIN N KWIKPEN 100 UNIT/ML Kiwkpen Inject 45-55 Units into the skin See admin instructions. Inject 55 units SQ in the morning and inject 45 units SQ in the evening  . hydrocortisone cream 0.5 % Apply 1 application topically 2 (two) times daily.   Marland Kitchen levothyroxine (SYNTHROID, LEVOTHROID) 75 MCG tablet Take 75 mcg by mouth daily before breakfast.   . oxyCODONE-acetaminophen (PERCOCET) 10-325 MG tablet Take 1 tablet by mouth every 6 (six) hours as needed for pain.  Marland Kitchen oxymorphone (OPANA ER) 30 MG 12 hr tablet Take 30 mg by mouth every 12 (twelve) hours.   Vladimir Faster Glycol-Propyl Glycol (SYSTANE) 0.4-0.3 % SOLN Place 1 drop  into both eyes daily.  . polyethylene glycol (MIRALAX / GLYCOLAX) packet Take 17 g by mouth daily. (Patient taking differently: Take 17 g by mouth daily as needed for mild constipation. )  . rosuvastatin (CRESTOR) 40 MG tablet Take 40 mg by mouth daily.  Marland Kitchen topiramate (TOPAMAX) 100 MG tablet Take 1 tablet (100 mg total) by mouth 2 (two) times daily.  . [DISCONTINUED] pregabalin (LYRICA) 50 MG capsule TAKE ONE CAPSULE BY MOUTH TWICE DAILY     Allergies:   Aspirin; Sulfa antibiotics; Demerol; and Penicillins   Social History   Tobacco Use  . Smoking status: Never Smoker  . Smokeless tobacco: Never Used  Substance Use Topics  . Alcohol use: No    Alcohol/week: 2.0 standard drinks  Types: 2 Standard drinks or equivalent per week  . Drug use: No     Family Hx: The patient's family history includes Breast cancer in her sister; Heart disease in her brother, father, mother, and sister.  ROS:   Please see the history of present illness.    Review of Systems  Constitutional: Negative.  Negative for chills and fever.  Eyes: Negative for blurred vision.  Respiratory: Negative.   Cardiovascular: Negative for chest pain and palpitations.  Neurological: Negative for dizziness and tingling.  Psychiatric/Behavioral: Negative.  Negative for confusion and depression.    All other systems reviewed and are negative.   Labs/Other Tests and Data Reviewed:    Recent Labs: 03/08/2018: ALT 17; TSH 2.720 03/29/2018: BUN 20; Creatinine, Ser 1.22; Hemoglobin 10.9; Platelets 281; Potassium 3.5; Sodium 140   Recent Lipid Panel Lab Results  Component Value Date/Time   CHOL 178 03/08/2018 11:34 AM   TRIG 55 03/08/2018 11:34 AM   HDL 86 03/08/2018 11:34 AM   CHOLHDL 2.1 03/08/2018 11:34 AM   LDLCALC 81 03/08/2018 11:34 AM    Wt Readings from Last 3 Encounters:  03/29/18 168 lb 12.8 oz (76.6 kg)  03/27/18 169 lb (76.7 kg)  03/08/18 169 lb (76.7 kg)     Exam:    Vital Signs:  There were no  vitals taken for this visit.    Physical Exam Unable to visualize due to telephone visit however she sounds like in good spirits and laughing at times during visit.   ASSESSMENT & PLAN:   1. Abdominal discomfort  This is ongoing and she has seen Dr. Collene Mares multiple times.  She is unable to afford the [place] she was given I sent a prescription for Lactobacillus as this has been the most effective for her - Lactobacillus TABS; Take 1 tablet by mouth daily.  Dispense: 90 tablet; Refill: 1  2. Essential hypertension . No blood pressure today due to being telephone visit.  . CMP ordered to check renal function.  . The importance of regular exercise and dietary modification was stressed to the patient.  . Stressed importance of losing ten percent of her body weight to help with B/P control.  . The weight loss would help with decreasing cardiac and cancer risk as well.   3. Hypothyroidism, unspecified type  Chronic, controlled  Continue with current medications  4. Uncontrolled type 2 diabetes mellitus with hyperglycemia ([place])  Chronic  Poorly controlled, she is being followed by Endocrinology  5. Chronic right shoulder pain  She had to reschedule her surgery to her shoulder due to COVID 19 and they would like for her HgbA1c to be below 8  Continues to go to the pain provider as well  COVID-19 Education: The signs and symptoms of COVID-19 were discussed with the patient and how to seek care for testing (follow up with PCP or arrange E-visit).  The importance of social distancing was discussed today.  Patient Risk:   After full review of this patients clinical status, I feel that they are at least moderate risk at this time.  Time:   Today, I have spent 15 minutes/ seconds with the patient with telehealth technology discussing above diagnoses.     Medication Adjustments/Labs and Tests Ordered: Current medicines are reviewed at length with the patient today.  Concerns regarding  medicines are outlined above.   Tests Ordered: No orders of the defined types were placed in this encounter.   Medication Changes: Meds ordered this encounter  Medications  . Lactobacillus TABS    Sig: Take 1 tablet by mouth daily.    Dispense:  90 tablet    Refill:  1    Disposition:  Follow up in 3 month(s)  Signed, Minette Brine, FNP

## 2018-07-04 ENCOUNTER — Ambulatory Visit: Payer: Self-pay | Admitting: Diagnostic Neuroimaging

## 2018-07-12 ENCOUNTER — Ambulatory Visit: Payer: Medicare Other | Admitting: Nurse Practitioner

## 2018-07-14 ENCOUNTER — Other Ambulatory Visit: Payer: Self-pay | Admitting: Diagnostic Neuroimaging

## 2018-07-15 ENCOUNTER — Other Ambulatory Visit: Payer: Self-pay | Admitting: Internal Medicine

## 2018-07-17 ENCOUNTER — Telehealth: Payer: Self-pay

## 2018-07-17 NOTE — Telephone Encounter (Signed)
Called pt to notify her she can purchase a probiotic OTC. YRL,RMA

## 2018-07-20 ENCOUNTER — Other Ambulatory Visit: Payer: Medicare Other

## 2018-07-20 ENCOUNTER — Other Ambulatory Visit: Payer: Self-pay

## 2018-07-21 LAB — CMP14 + ANION GAP
ALT: 19 IU/L (ref 0–32)
AST: 21 IU/L (ref 0–40)
Albumin/Globulin Ratio: 1.9 (ref 1.2–2.2)
Albumin: 4.8 g/dL (ref 3.8–4.8)
Alkaline Phosphatase: 89 IU/L (ref 39–117)
Anion Gap: 15 mmol/L (ref 10.0–18.0)
BUN/Creatinine Ratio: 14 (ref 12–28)
BUN: 18 mg/dL (ref 8–27)
Bilirubin Total: 0.2 mg/dL (ref 0.0–1.2)
CO2: 21 mmol/L (ref 20–29)
Calcium: 10.2 mg/dL (ref 8.7–10.3)
Chloride: 108 mmol/L — ABNORMAL HIGH (ref 96–106)
Creatinine, Ser: 1.3 mg/dL — ABNORMAL HIGH (ref 0.57–1.00)
GFR calc Af Amer: 50 mL/min/{1.73_m2} — ABNORMAL LOW (ref >59)
GFR calc non Af Amer: 44 mL/min/{1.73_m2} — ABNORMAL LOW (ref >59)
Globulin, Total: 2.5 g/dL (ref 1.5–4.5)
Glucose: 75 mg/dL (ref 65–99)
Potassium: 4 mmol/L (ref 3.5–5.2)
Sodium: 144 mmol/L (ref 134–144)
Total Protein: 7.3 g/dL (ref 6.0–8.5)

## 2018-07-21 LAB — T3: T3, Total: 104 ng/dL (ref 71–180)

## 2018-07-21 LAB — HEMOGLOBIN A1C
Est. average glucose Bld gHb Est-mCnc: 220 mg/dL
Hgb A1c MFr Bld: 9.3 % — ABNORMAL HIGH (ref 4.8–5.6)

## 2018-07-21 LAB — T4, FREE: Free T4: 1.36 ng/dL (ref 0.82–1.77)

## 2018-07-21 LAB — TSH: TSH: 0.726 u[IU]/mL (ref 0.450–4.500)

## 2018-08-28 ENCOUNTER — Other Ambulatory Visit: Payer: Self-pay | Admitting: Internal Medicine

## 2018-08-28 ENCOUNTER — Other Ambulatory Visit: Payer: Self-pay | Admitting: Diagnostic Neuroimaging

## 2018-10-04 ENCOUNTER — Ambulatory Visit: Payer: Medicare Other | Admitting: Nurse Practitioner

## 2018-10-05 LAB — HM DIABETES EYE EXAM

## 2018-10-25 ENCOUNTER — Ambulatory Visit: Payer: Medicare Other | Admitting: Nurse Practitioner

## 2018-11-07 ENCOUNTER — Other Ambulatory Visit: Payer: Self-pay | Admitting: Nurse Practitioner

## 2018-11-23 DIAGNOSIS — J45998 Other asthma: Secondary | ICD-10-CM | POA: Diagnosis not present

## 2018-11-27 DIAGNOSIS — J45998 Other asthma: Secondary | ICD-10-CM | POA: Diagnosis not present

## 2018-12-14 ENCOUNTER — Other Ambulatory Visit: Payer: Self-pay | Admitting: Diagnostic Neuroimaging

## 2018-12-14 DIAGNOSIS — J45998 Other asthma: Secondary | ICD-10-CM | POA: Diagnosis not present

## 2018-12-14 NOTE — Telephone Encounter (Signed)
Refill sent through the RX refills bin.

## 2018-12-14 NOTE — Telephone Encounter (Signed)
Pt is requesting a refill of Erenumab-aooe (AIMOVIG) 41 MG/ML SOAJ, to be sent to [id number] - [place], [place] [address] [address]

## 2018-12-28 DIAGNOSIS — J45998 Other asthma: Secondary | ICD-10-CM | POA: Diagnosis not present

## 2019-01-04 DIAGNOSIS — J45998 Other asthma: Secondary | ICD-10-CM | POA: Diagnosis not present

## 2019-01-18 ENCOUNTER — Telehealth: Payer: Self-pay | Admitting: *Deleted

## 2019-01-18 NOTE — Telephone Encounter (Signed)
Started Aimovig 70 mg PA on CMM, key: [id number]. Will wait for [employer] 2017 NCPDP to return determination.

## 2019-01-18 NOTE — Telephone Encounter (Signed)
PA approved by [employer] [phone]).  [id number].  Pt [id number].  Effective through 02/15/2020.

## 2019-01-25 DIAGNOSIS — J45998 Other asthma: Secondary | ICD-10-CM | POA: Diagnosis not present

## 2019-02-02 DIAGNOSIS — E1165 Type 2 diabetes mellitus with hyperglycemia: Secondary | ICD-10-CM | POA: Diagnosis not present

## 2019-02-21 DIAGNOSIS — J45998 Other asthma: Secondary | ICD-10-CM | POA: Diagnosis not present

## 2019-02-27 DIAGNOSIS — J45998 Other asthma: Secondary | ICD-10-CM | POA: Diagnosis not present

## 2019-02-28 ENCOUNTER — Other Ambulatory Visit: Payer: Self-pay

## 2019-02-28 ENCOUNTER — Encounter: Payer: Self-pay | Admitting: Diagnostic Neuroimaging

## 2019-02-28 ENCOUNTER — Ambulatory Visit (INDEPENDENT_AMBULATORY_CARE_PROVIDER_SITE_OTHER): Payer: Medicare Other | Admitting: Diagnostic Neuroimaging

## 2019-02-28 VITALS — BP 136/84 | HR 101 | Temp 97.4°F | Ht <= 58 in | Wt 156.4 lb

## 2019-02-28 DIAGNOSIS — G43019 Migraine without aura, intractable, without status migrainosus: Secondary | ICD-10-CM

## 2019-02-28 MED ORDER — TOPIRAMATE 100 MG PO TABS: 100.0000 mg | ORAL_TABLET | Freq: Two times a day (BID) | ORAL | 4 refills | Status: DC

## 2019-02-28 MED ORDER — AIMOVIG 140 MG/ML ~~LOC~~ SOAJ: 140.0000 mg | SUBCUTANEOUS | 4 refills | Status: DC

## 2019-02-28 NOTE — Patient Instructions (Signed)
-   continue topiramate 100mg  twice a day   - increase aimovig to 140mg  (monthly injections)  - tylenol as needed

## 2019-02-28 NOTE — Progress Notes (Signed)
Chief Complaint  Patient presents with  . Migraine    rm 6, "need refill on meds; Aimovig really works better than pills, still have headaches; rizatriptan didn't work"     History of Present Illness:  UPDATE (02/28/19, VRP): Since last visit, HA continue ("all the time"). Tolerating aimovig and topiramate. Symptoms are stable. Severity is moderate. No alleviating or aggravating factors. Tolerating meds.  UPDATE (06/26/18, VRP): Since last visit, doing poorly. HA symptoms are continuing. Severity is high. No alleviating or aggravating factors. Tolerating meds. More stress. Had 3 family members die of COVID 71 (living in [place] and [place]).   UPDATE (11/16/16, VRP): Since last visit, doing poorly. More back pain, headaches, insomnia. Tolerating topiramate and rizatriptan.  UPDATE 07/29/15: Since last visit, symptoms stable. Daily HA + intermittent headaches. Patient had to cancel sleep study due to family issues.  UPDATE 01/01/15: Since last visit, still with daily HA. Also with 5 severe migraines per week.  PRIOR HPI (03/12/14): 64 year old ambidextrous female here for evaluation of headaches. Since age 28 years old patient has had intermittent global, severe throbbing headaches, with blurred vision, irritability, nausea and photophobia and phonophobia. Around 2008 patient was evaluated by headache and wellness center, dx'd with migraine, and prescribed topiramate 25 mg twice a day. Patient then followed up with PCP who increased this to 50 mg twice a day for past 2-3 yrs. Patient continues to have multiple low-grade headaches per day. She has 3 severe migraine headaches per week. She typically tries to calm herself, relax, sometimes takes half tablet of generic Excedrin Migraine tablet. She reports allergy to aspirin and this medication has aspirin in it. She reports some itching sensation when she takes it. Patient reports remote sleep study testing more than 10 years ago but does not know the  results. She is not on CPAP therapy. She has significant insomnia which she feels is related to her chronic back pain. She has snoring, obesity, HTN  and daytime sleepiness. No specific other triggering factors for headache.    Observations/Objective:  GENERAL EXAM/CONSTITUTIONAL: Vitals:  Vitals:   02/28/19 1356  BP: 136/84  Pulse: (!) 101  Temp: (!) 97.4 F (36.3 C)  Weight: 156 lb 6.4 oz (70.9 kg)  Height: 4\' 9"  (1.448 m)     Body mass index is 33.84 kg/m. Wt Readings from Last 3 Encounters:  02/28/19 156 lb 6.4 oz (70.9 kg)  03/29/18 168 lb 12.8 oz (76.6 kg)  03/27/18 169 lb (76.7 kg)     Patient is in no distress; well developed, nourished and groomed; neck is supple  CARDIOVASCULAR:  Examination of carotid arteries is normal; no carotid bruits  Regular rate and rhythm, no murmurs  Examination of peripheral vascular system by observation and palpation is normal  EYES:  Ophthalmoscopic exam of optic discs and posterior segments is normal; no papilledema or hemorrhages  No exam data present  MUSCULOSKELETAL:  Gait, strength, tone, movements noted in Neurologic exam below  NEUROLOGIC: MENTAL STATUS:  No flowsheet data found.  awake, alert, oriented to person, place and time  recent and remote memory intact  normal attention and concentration  language fluent, comprehension intact, naming intact  fund of knowledge appropriate  CRANIAL NERVE:   2nd - no papilledema on fundoscopic exam  2nd, 3rd, 4th, 6th - pupils equal and reactive to light, visual fields full to confrontation, extraocular muscles intact, no nystagmus  5th - facial sensation symmetric  7th - facial strength symmetric  8th -  hearing intact  9th - palate elevates symmetrically, uvula midline  11th - shoulder shrug symmetric  12th - tongue protrusion midline  MOTOR:   normal bulk and tone, full strength in the BUE, BLE  SENSORY:   normal and symmetric to light touch,  temperature, vibration  COORDINATION:   finger-nose-finger, fine finger movements normal  REFLEXES:   deep tendon reflexes present and symmetric  GAIT/STATION:   narrow based gait    Assessment and Plan:  64 y.o. female here with migraine without aura since age 8 years old. Headaches are aggravated by insomnia, chronic back pain, family stress issues.  Dx:  1. Intractable migraine without aura and without status migrainosus     - continue topiramate 100mg  twice a day  - increase aimovig to 140mg  (monthly injections) - tylenol as needed   Follow Up Instructions:  - Return in about 1 year (around 02/28/2020) for with NP (Amy Lomax).     Penni Bombard, MD [id number], [id number] PM Certified in Neurology, Neurophysiology and Neuroimaging  [facility] [address] [place], [place] [address] [phone]

## 2019-03-01 DIAGNOSIS — G894 Chronic pain syndrome: Secondary | ICD-10-CM | POA: Diagnosis not present

## 2019-03-01 DIAGNOSIS — M5136 Other intervertebral disc degeneration, lumbar region: Secondary | ICD-10-CM | POA: Diagnosis not present

## 2019-03-06 ENCOUNTER — Telehealth: Payer: Self-pay | Admitting: *Deleted

## 2019-03-06 NOTE — Telephone Encounter (Signed)
Received fax from [facility] re: patient getting refills of rizatriptan, #9 tabs in Nov, Dec 2020 and Jan 2021. Per last note she stated it didn't work, she wasn't taking it. Called patient and asked why she is refilling it. She stated that [facility] delivers her medications, and refills it even though she's told them she doesn't take it. I advised her I'll call and discontinue the medication. Patient verbalized understanding, appreciation. Called [facility], spoke with Madelynn Done and advised him per Dr Leta Baptist to discontinue rizatriptan. Clement  verbalized understanding, appreciation.

## 2019-03-07 DIAGNOSIS — M25561 Pain in right knee: Secondary | ICD-10-CM | POA: Diagnosis not present

## 2019-03-07 DIAGNOSIS — M25562 Pain in left knee: Secondary | ICD-10-CM | POA: Diagnosis not present

## 2019-03-14 ENCOUNTER — Ambulatory Visit: Payer: Medicare Other

## 2019-03-14 ENCOUNTER — Ambulatory Visit: Payer: Medicare Other | Admitting: Internal Medicine

## 2019-03-14 ENCOUNTER — Ambulatory Visit: Payer: Medicare Other | Admitting: Nurse Practitioner

## 2019-03-14 DIAGNOSIS — M961 Postlaminectomy syndrome, not elsewhere classified: Secondary | ICD-10-CM | POA: Diagnosis not present

## 2019-03-14 DIAGNOSIS — Z79891 Long term (current) use of opiate analgesic: Secondary | ICD-10-CM | POA: Diagnosis not present

## 2019-03-14 DIAGNOSIS — G894 Chronic pain syndrome: Secondary | ICD-10-CM | POA: Diagnosis not present

## 2019-03-14 DIAGNOSIS — M5136 Other intervertebral disc degeneration, lumbar region: Secondary | ICD-10-CM | POA: Diagnosis not present

## 2019-03-14 DIAGNOSIS — M533 Sacrococcygeal disorders, not elsewhere classified: Secondary | ICD-10-CM | POA: Diagnosis not present

## 2019-03-21 ENCOUNTER — Ambulatory Visit: Payer: Medicare Other | Admitting: Orthopedic Surgery

## 2019-03-28 ENCOUNTER — Other Ambulatory Visit: Payer: Self-pay

## 2019-03-28 ENCOUNTER — Encounter: Payer: Self-pay | Admitting: Orthopedic Surgery

## 2019-03-28 ENCOUNTER — Ambulatory Visit (INDEPENDENT_AMBULATORY_CARE_PROVIDER_SITE_OTHER): Payer: Medicare Other | Admitting: Orthopedic Surgery

## 2019-03-28 DIAGNOSIS — G8929 Other chronic pain: Secondary | ICD-10-CM | POA: Diagnosis not present

## 2019-03-28 DIAGNOSIS — M792 Neuralgia and neuritis, unspecified: Secondary | ICD-10-CM

## 2019-03-28 DIAGNOSIS — M25511 Pain in right shoulder: Secondary | ICD-10-CM | POA: Diagnosis not present

## 2019-03-29 ENCOUNTER — Other Ambulatory Visit: Payer: Self-pay | Admitting: Diagnostic Neuroimaging

## 2019-03-29 ENCOUNTER — Other Ambulatory Visit: Payer: Self-pay | Admitting: Nurse Practitioner

## 2019-03-30 ENCOUNTER — Encounter: Payer: Self-pay | Admitting: Orthopedic Surgery

## 2019-03-30 DIAGNOSIS — J45998 Other asthma: Secondary | ICD-10-CM | POA: Diagnosis not present

## 2019-03-30 NOTE — Progress Notes (Signed)
Office Visit Note   Patient: Brianna Mitchell           Date of Birth: 04/11/1955           MRN: [id number] Visit Date: 03/28/2019 Requested by: Minette Brine, [place] [address] [place],  [place] [address] PCP: Minette Brine, FNP  Subjective: Chief Complaint  Patient presents with  . Right Shoulder - Pain    HPI: Brianna Mitchell is a 64 y.o. female who presents to the office complaining of right shoulder and arm pain.  Patient notes entire right arm pain.  Patient notes that pain begins in her neck and travels down the right arm into the hand.  This pain is associated with numbness/tingling.  It wakes her up at night.  She also endorses subjective weakness of the right arm.  She has been taking Tylenol as needed as well as taking Percocet that she receives from pain management.  She denies any history of neck surgery.  She does have a history of 2 prior right shoulder surgeries and a cyst excision of her right wrist.  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                ROS:  All systems reviewed are negative as they relate to the chief complaint within the history of present illness.  Patient denies fevers or chills.  Assessment & Plan: Visit Diagnoses:  1. Radicular pain in right arm   2. Chronic right shoulder pain     Plan: Patient is a 64 year old female who presents complaining of right arm radicular pain.  His pain is waking her up at night and my impression is that it is coming from her neck.  This is associated with numbness and tingling and her prescribed Percocet is not controlling the pain.  On exam she has good strength of the right upper extremity but she does have significant numbness throughout the entire arm compared with the contralateral side.  We will refer patient to Dr. Laurence Spates for cervical spine ESI's due to right foraminal stenosis that was  identified on her 2018 MRI.  Patient agreed with plan and will follow up with the office as needed.  Follow-Up Instructions: No follow-ups on file.   Orders:  Orders Placed This Encounter  Procedures  . Ambulatory referral to Physical Medicine Rehab   No orders of the defined types were placed in this encounter.     Procedures: No procedures performed   Clinical Data: No additional findings.  Objective: Vital Signs: There were no vitals taken for this visit.  Physical Exam:  Constitutional: Patient appears well-developed HEENT:  Head: Normocephalic Eyes:EOM are normal Neck: Normal range of motion Cardiovascular: Normal rate Pulmonary/chest: Effort normal Neurologic: Patient is alert Skin: Skin is warm Psychiatric: Patient has normal mood and affect  Ortho Exam:  5/5 motor strength of the bilateral grip strength, finger abduction, pronation, supination, bicep flexion, tricep extension, deltoid.  Decreased sensation through all dermatomes of the right upper extremity when compared with the left upper extremity.  Reflexes are normal without any evidence of hypo or hyperreflexia.  Specialty Comments:  No specialty comments available.  Imaging: No results found.   PMFS History: Patient Active Problem List   Diagnosis Date Noted  . Essential hypertension 03/09/2018  . Other insomnia 03/09/2018  . follows with endocrinology 03/09/2018  .  Hypothyroidism 03/09/2018  . Controlled type 2 diabetes mellitus with hyperglycemia ([place]) 12/21/2017  . Abdominal pain 12/21/2017  . 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 Anemia   . Asthma   . Back pain    02/2019 getting injections  . Carpal tunnel syndrome   . Diabetes mellitus    type 2   . Fibroid tumor   . Ganglion cyst   . GERD (gastroesophageal reflux disease)    will see Dr. Tammi Klippel 04/2018  . Headache(784.0)   . Heart murmur    saw dr. Einar Gip (only sees if needed, not seen since  2011)  . Hypertension    dr Einar Gip     . Hypothyroidism   . Shortness of breath    on exertion   . Shoulder pain   . Tendonitis    left shoulder biceps tendonitis, acromioclavicular osteoarthritis  . Tennis elbow   . Thyroid disease     Family History  Problem Relation Age of Onset  . Heart disease Mother   . Heart disease Father   . Breast cancer Sister   . Heart disease Sister   . Heart disease Brother     Past Surgical History:  Procedure Laterality Date  . ABDOMINAL HYSTERECTOMY    . ANTERIOR LAT LUMBAR FUSION N/A 01/03/2013   Procedure: X LIF (Extreme Lateral Interbody Fusion) L3-L4,  (1 LEVEL) ;  Surgeon: Melina Schools, MD;  Location: [place];  Service: Orthopedics;  Laterality: N/A;  . BACK SURGERY     x5  . CARDIAC CATHETERIZATION    . CHOLECYSTECTOMY    . COLONOSCOPY  04/18/2017   Dr Juanita Craver ; [place]   . EYE SURGERY  05/23/2017   laser of right eye, to have left eye done on 06-13-17 - for cataracts   . KNEE ARTHROSCOPY Right 04/2014  . LAPAROSCOPY N/A 05/30/2017   Procedure: LAPAROSCOPY DIAGNOSTIC;  Surgeon: Greer Pickerel, MD;  Location: [facility];  Service: General;  Laterality: N/A;  . LEFT HEART CATHETERIZATION WITH CORONARY ANGIOGRAM N/A 04/27/2011   Procedure: LEFT HEART CATHETERIZATION WITH CORONARY ANGIOGRAM;  Surgeon: Laverda Page, MD;  Location: [facility] CATH LAB;  Service: Cardiovascular;  Laterality: N/A;  . pelvic bone removed    . ROTATOR CUFF REPAIR Left    x3 surgeries; Dr Nicki Reaper [facility]   . SHOULDER ARTHROSCOPY WITH SUBACROMIAL DECOMPRESSION, ROTATOR CUFF REPAIR AND BICEP TENDON REPAIR Left 02/03/2016   Procedure: SHOULDER ARTHROSCOPY WITH SUBACROMIAL DECOMPRESSION, DEBRIDEMENT, BICEPS TENODESIS, DISTAL CLAVICLE EXCISION.;  Surgeon: Meredith Pel, MD;  Location: [place];  Service: Orthopedics;  Laterality: Left;  . THYROID SURGERY     LUMP REMOVED  . TUBAL LIGATION     Social History   Occupational History  .  Occupation: disability  Tobacco Use  . Smoking status: Never Smoker  . Smokeless tobacco: Never Used  Substance and Sexual Activity  . Alcohol use: No    Alcohol/week: 2.0 standard drinks    Types: 2 Standard drinks or equivalent per week  . Drug use: No  . Sexual activity: Yes

## 2019-03-31 ENCOUNTER — Encounter: Payer: Self-pay | Admitting: Orthopedic Surgery

## 2019-04-04 ENCOUNTER — Ambulatory Visit: Payer: Medicare Other | Admitting: Nurse Practitioner

## 2019-04-04 ENCOUNTER — Ambulatory Visit: Payer: Medicare Other

## 2019-04-04 DIAGNOSIS — M533 Sacrococcygeal disorders, not elsewhere classified: Secondary | ICD-10-CM | POA: Diagnosis not present

## 2019-04-06 ENCOUNTER — Other Ambulatory Visit: Payer: Self-pay

## 2019-04-06 DIAGNOSIS — M792 Neuralgia and neuritis, unspecified: Secondary | ICD-10-CM

## 2019-04-12 ENCOUNTER — Encounter: Payer: Self-pay | Admitting: Nurse Practitioner

## 2019-04-12 ENCOUNTER — Other Ambulatory Visit: Payer: Self-pay

## 2019-04-12 ENCOUNTER — Ambulatory Visit (INDEPENDENT_AMBULATORY_CARE_PROVIDER_SITE_OTHER): Payer: Medicare Other

## 2019-04-12 ENCOUNTER — Ambulatory Visit (INDEPENDENT_AMBULATORY_CARE_PROVIDER_SITE_OTHER): Payer: Medicare Other | Admitting: Nurse Practitioner

## 2019-04-12 VITALS — BP 116/82 | HR 104 | Temp 98.9°F | Ht 60.6 in | Wt 153.6 lb

## 2019-04-12 VITALS — BP 116/82 | HR 104 | Temp 98.9°F | Ht 60.6 in | Wt 153.0 lb

## 2019-04-12 DIAGNOSIS — R519 Headache, unspecified: Secondary | ICD-10-CM

## 2019-04-12 DIAGNOSIS — Z0000 Encounter for general adult medical examination without abnormal findings: Secondary | ICD-10-CM

## 2019-04-12 DIAGNOSIS — E039 Hypothyroidism, unspecified: Secondary | ICD-10-CM

## 2019-04-12 DIAGNOSIS — I10 Essential (primary) hypertension: Secondary | ICD-10-CM

## 2019-04-12 DIAGNOSIS — Z1159 Encounter for screening for other viral diseases: Secondary | ICD-10-CM

## 2019-04-12 DIAGNOSIS — E1165 Type 2 diabetes mellitus with hyperglycemia: Secondary | ICD-10-CM

## 2019-04-12 DIAGNOSIS — G8929 Other chronic pain: Secondary | ICD-10-CM

## 2019-04-12 MED ORDER — HUMULIN N KWIKPEN 100 UNIT/ML ~~LOC~~ SUPN: 45.0000 [IU] | PEN_INJECTOR | SUBCUTANEOUS | Status: DC

## 2019-04-12 NOTE — Progress Notes (Signed)
This visit occurred during the SARS-CoV-2 public health emergency.  Safety protocols were in place, including screening questions prior to the visit, additional usage of staff PPE, and extensive cleaning of exam room while observing appropriate contact time as indicated for disinfecting solutions.  Subjective:   Brianna Mitchell is a 64 y.o. female who presents for Medicare Annual (Subsequent) preventive examination.  Review of Systems: n/a Cardiac Risk Factors include: diabetes mellitus;hypertension;sedentary lifestyle     Objective:     Vitals: BP 116/82   Pulse (!) 104   Temp 98.9 F (37.2 C)   Ht 5' 0.6" (1.539 m)   Wt 153 lb (69.4 kg)   BMI 29.29 kg/m   Body mass index is 29.29 kg/m.  Advanced Directives 04/12/2019 05/30/2017 02/03/2016 01/27/2016 12/05/2015 01/01/2015 01/06/2013  Does Patient Have a Medical Advance Directive? No No No No No Yes;No Patient does not have advance directive  Does patient want to make changes to medical advance directive? - - - - - No - Patient declined -  Would patient like information on creating a medical advance directive? No - Patient declined No - Patient declined - No - Patient declined No - patient declined information - -  Pre-existing out of facility DNR order (yellow form or pink MOST form) - - - - - - No  Some encounter information is confidential and restricted. Go to Review Flowsheets activity to see all data.    Tobacco Social History   Tobacco Use  Smoking Status Never Smoker  Smokeless Tobacco Never Used     Counseling given: Not Answered   Clinical Intake:  Pre-visit preparation completed: Yes  Pain : No/denies pain     Nutritional Status: BMI 25 -29 Overweight Nutritional Risks: None Diabetes: Yes  How often do you need to have someone help you when you read instructions, pamphlets, or other written materials from your doctor or pharmacy?: 1 - Never What is the last grade level you completed in school?:  college  Interpreter Needed?: No  Information entered by :: NAllen LPN  Past Medical History:  Diagnosis Date  . Anemia   . Asthma   . Back pain    02/2019 getting injections  . Carpal tunnel syndrome   . Diabetes mellitus    type 2   . Fibroid tumor   . Ganglion cyst   . GERD (gastroesophageal reflux disease)    will see Dr. Tammi Klippel 04/2018  . Headache(784.0)   . Heart murmur    saw dr. Einar Gip (only sees if needed, not seen since 2011)  . Hypertension    dr Einar Gip     . Hypothyroidism   . Shortness of breath    on exertion   . Shoulder pain   . Tendonitis    left shoulder biceps tendonitis, acromioclavicular osteoarthritis  . Tennis elbow   . Thyroid disease    Past Surgical History:  Procedure Laterality Date  . ABDOMINAL HYSTERECTOMY    . ANTERIOR LAT LUMBAR FUSION N/A 01/03/2013   Procedure: X LIF (Extreme Lateral Interbody Fusion) L3-L4,  (1 LEVEL) ;  Surgeon: Melina Schools, MD;  Location: [facility];  Service: Orthopedics;  Laterality: N/A;  . BACK SURGERY     x5  . CARDIAC CATHETERIZATION    . CHOLECYSTECTOMY    . COLONOSCOPY  04/18/2017   Dr Juanita Craver ; [place]   . EYE SURGERY  05/23/2017   laser of right eye, to have left eye done on 06-13-17 -  for cataracts   . KNEE ARTHROSCOPY Right 04/2014  . LAPAROSCOPY N/A 05/30/2017   Procedure: LAPAROSCOPY DIAGNOSTIC;  Surgeon: Greer Pickerel, MD;  Location: [facility];  Service: General;  Laterality: N/A;  . LEFT HEART CATHETERIZATION WITH CORONARY ANGIOGRAM N/A 04/27/2011   Procedure: LEFT HEART CATHETERIZATION WITH CORONARY ANGIOGRAM;  Surgeon: Laverda Page, MD;  Location: [facility] CATH [facility];  Service: Cardiovascular;  Laterality: N/A;  . pelvic bone removed    . ROTATOR CUFF REPAIR Left    x3 surgeries; Dr Nicki Reaper [facility]   . SHOULDER ARTHROSCOPY WITH SUBACROMIAL DECOMPRESSION, ROTATOR CUFF REPAIR AND BICEP TENDON REPAIR Left 02/03/2016   Procedure: SHOULDER ARTHROSCOPY WITH SUBACROMIAL  DECOMPRESSION, DEBRIDEMENT, BICEPS TENODESIS, DISTAL CLAVICLE EXCISION.;  Surgeon: Meredith Pel, MD;  Location: [facility];  Service: Orthopedics;  Laterality: Left;  . THYROID SURGERY     LUMP REMOVED  . TUBAL LIGATION     Family History  Problem Relation Age of Onset  . Heart disease Mother   . Heart disease Father   . Breast cancer Sister   . Heart disease Sister   . Heart disease Brother   . Heart disease Paternal Uncle    Social History   Socioeconomic History  . Marital status: Divorced    Spouse name: Not on file  . Number of children: Not on file  . Years of education: Not on file  . Highest education level: Not on file  Occupational History  . Occupation: disability  Tobacco Use  . Smoking status: Never Smoker  . Smokeless tobacco: Never Used  Substance and Sexual Activity  . Alcohol use: No  . Drug use: No  . Sexual activity: Yes  Other Topics Concern  . Not on file  Social History Narrative  . Not on file   Social Determinants of Health   Financial Resource Strain: Low Risk   . Difficulty of Paying Living Expenses: Not hard at all  Food Insecurity: No Food Insecurity  . Worried About Charity fundraiser in the Last Year: Never true  . Ran Out of Food in the Last Year: Never true  Transportation Needs: No Transportation Needs  . Lack of Transportation (Medical): No  . Lack of Transportation (Non-Medical): No  Physical Activity: Inactive  . Days of Exercise per Week: 0 days  . Minutes of Exercise per Session: 0 min  Stress: No Stress Concern Present  . Feeling of Stress : Not at all  Social Connections:   . Frequency of Communication with Friends and Family: Not on file  . Frequency of Social Gatherings with Friends and Family: Not on file  . Attends Religious Services: Not on file  . Active Member of Clubs or Organizations: Not on file  . Attends Archivist Meetings: Not on file  . Marital Status: Not on file    Outpatient Encounter  Medications as of 04/12/2019  Medication Sig  . AIMOVIG 70 MG/ML SOAJ INJECT 70 MG INTO THE SKIN EVERY 30 DAYS  . albuterol (PROVENTIL HFA;VENTOLIN HFA) 108 (90 BASE) MCG/ACT inhaler Inhale 2 puffs into the lungs every 6 (six) hours as needed for wheezing or shortness of breath.   Marland Kitchen albuterol (PROVENTIL) (2.5 MG/3ML) 0.083% nebulizer solution Take 2.5 mg by nebulization every 6 (six) hours as needed for wheezing or shortness of breath.   . beclomethasone (QVAR) 80 MCG/ACT inhaler Inhale 1 puff into the lungs daily.   Marland Kitchen desloratadine (CLARINEX) 5 MG tablet Take 1 tablet (5 mg total)  by mouth daily.  Marland Kitchen DEXILANT 60 MG capsule TAKE ONE CAPSULE BY MOUTH EVERY DAY  . Erenumab-aooe (AIMOVIG) 140 MG/ML SOAJ Inject 140 mg into the skin every 30 (thirty) days.  Marland Kitchen HUMALOG KWIKPEN 100 UNIT/ML KiwkPen Inject 5-18 Units into the skin 3 (three) times daily. Based on sliding scale 70-100= 5 units, 101-150 = 7 units, 151-200 = 9 units, 201-250 = 11 units, 251-300 = 13 units, 301-350 = 15 units, > 350= 18 units  . HUMULIN N KWIKPEN 100 UNIT/ML Kiwkpen Inject 45-55 Units into the skin See admin instructions. Inject 55 units SQ in the morning and inject 30 units SQ in the evening  . hydrocortisone cream 0.5 % Apply 1 application topically 2 (two) times daily.   . Lactobacillus TABS Take 1 tablet by mouth daily.  Marland Kitchen levothyroxine (SYNTHROID, LEVOTHROID) 75 MCG tablet Take 75 mcg by mouth daily before breakfast.   . oxyCODONE-acetaminophen (PERCOCET) 10-325 MG tablet Take 1 tablet by mouth every 6 (six) hours as needed for pain.  Marland Kitchen oxymorphone (OPANA ER) 30 MG 12 hr tablet Take 30 mg by mouth every 12 (twelve) hours.   Vladimir Faster Glycol-Propyl Glycol (SYSTANE) 0.4-0.3 % SOLN Place 1 drop into both eyes daily.  . polyethylene glycol (MIRALAX / GLYCOLAX) packet Take 17 g by mouth daily. (Patient taking differently: Take 17 g by mouth daily as needed for mild constipation. )  . rosuvastatin (CRESTOR) 40 MG tablet Take 40 mg  by mouth daily.  Marland Kitchen topiramate (TOPAMAX) 100 MG tablet Take 1 tablet (100 mg total) by mouth 2 (two) times daily.   No facility-administered encounter medications on file as of 04/12/2019.    Activities of Daily Living In your present state of health, do you have any difficulty performing the following activities: 04/12/2019  Hearing? N  Vision? N  Difficulty concentrating or making decisions? N  Walking or climbing stairs? N  Dressing or bathing? N  Doing errands, shopping? N  Preparing Food and eating ? N  Using the Toilet? N  In the past six months, have you accidently leaked urine? N  Do you have problems with loss of bowel control? N  Managing your Medications? N  Managing your Finances? N  Housekeeping or managing your Housekeeping? N  Some recent data might be hidden    Patient Care Team: Minette Brine, FNP as PCP - General (General Practice)    Assessment:   This is a routine wellness examination for [place].  Exercise Activities and Dietary recommendations Current Exercise Habits: The patient does not participate in regular exercise at present  Goals    . Patient Stated     04/12/2019, wants to get better    . Weight (lb) < 200 lb (90.7 kg)     Eat healthy       Fall Risk Fall Risk  04/12/2019 04/12/2019 03/08/2018 07/29/2015 01/01/2015  Falls in the past year? 0 0 - No No  Comment - - right leg went numb - -  Risk for fall due to : Medication side effect - - - -  Follow up Falls evaluation completed;Education provided;Falls prevention discussed - - - -  Some encounter information is confidential and restricted. Go to Review Flowsheets activity to see all data.   Is the patient's home free of loose throw rugs in walkways, pet beds, electrical cords, etc?   yes      Grab bars in the bathroom? no      Handrails on the stairs?  yes      Adequate lighting?   yes  Timed Get Up and Go performed: n/a  Depression Screen PHQ 2/9 Scores 04/12/2019 04/12/2019  PHQ - 2  Score 0 0  PHQ- 9 Score 3 -  Some encounter information is confidential and restricted. Go to Review Flowsheets activity to see all data.     Cognitive Function     6CIT Screen 04/12/2019  What Year? 0 points  What month? 0 points  What time? 0 points  Count back from 20 0 points  Months in reverse 4 points  Repeat phrase 10 points  Total Score 14  Some encounter information is confidential and restricted. Go to Review Flowsheets activity to see all data.    Immunization History  Administered Date(s) Administered  . Influenza,inj,Quad PF,6+ Mos 01/05/2013, 02/04/2016, 11/25/2018  . Pneumococcal Polysaccharide-23 01/05/2013  . Tdap 12/18/2012    Qualifies for Shingles Vaccine? yes  Screening Tests Health Maintenance  Topic Date Due  . HIV Screening  11/23/1969  . PAP SMEAR-Modifier  11/24/1975  . MAMMOGRAM  11/23/2004  . OPHTHALMOLOGY EXAM  12/14/2018  . HEMOGLOBIN A1C  01/19/2019  . URINE MICROALBUMIN  03/09/2019  . COLONOSCOPY  04/11/2020 (Originally 11/23/2004)  . FOOT EXAM  04/11/2020  . TETANUS/TDAP  12/19/2022  . INFLUENZA VACCINE  Completed  . Hepatitis C Screening  Completed    Cancer Screenings: Lung: Low Dose CT Chest recommended if Age 37-80 years, 30 pack-year currently smoking OR have quit w/in 15years. Patient does not qualify. Breast:  Up to date on Mammogram? No   Up to date of Bone Density/Dexa? n/a Colorectal: decline  Additional Screenings: : Hepatitis C Screening: today     Plan:    Patient states that she just wants to get better. 6 CIT was high. She states that her uncle just died and she could not really think to answer some questions.   I have personally reviewed and noted the following in the patient's chart:   . Medical and social history . Use of alcohol, tobacco or illicit drugs  . Current medications and supplements . Functional ability and status . Nutritional status . Physical activity . Advanced directives . List of other  physicians . Hospitalizations, surgeries, and ER visits in previous 12 months . Vitals . Screenings to include cognitive, depression, and falls . Referrals and appointments  In addition, I have reviewed and discussed with patient certain preventive protocols, quality metrics, and best practice recommendations. A written personalized care plan for preventive services as well as general preventive health recommendations were provided to patient.     Kellie Simmering, LPN  [id number]

## 2019-04-12 NOTE — Progress Notes (Addendum)
This visit occurred during the SARS-CoV-2 public health emergency.  Safety protocols were in place, including screening questions prior to the visit, additional usage of staff PPE, and extensive cleaning of exam room while observing appropriate contact time as indicated for disinfecting solutions.  Subjective:     Patient ID: Brianna Mitchell , female    DOB: [birth year] , 64 y.o.   MRN: [id number]   Chief Complaint  Patient presents with  . Diabetes    HPI  Wt Readings from Last 3 Encounters: 04/12/19 : 153 lb 9.6 oz (69.7 kg) 02/28/19 : 156 lb 6.4 oz (70.9 kg) 09/21/17 : 162 lb 12.8 oz (73.8 kg)  She had a right hip injection by Dr. Nelva Bush - 04/04/2019.  She is scheduled to see him next month.  If the injection is not effective she will need a hip replacement.  Reports she continues to have a lot of pain.    Her uncle passed on 27-Mar-2019.    She is having continuous headaches, taking amovig and topiramate.  She has not had the covid 19 vaccine.    Diabetes She presents for her follow-up diabetic visit. She has type 2 diabetes mellitus. Her disease course has been stable. Hypoglycemia symptoms include headaches. There are no diabetic associated symptoms. Symptoms are stable. She has not had a previous visit with a dietitian. She rarely participates in exercise. (Blood sugar this morning was 149) An ACE inhibitor/angiotensin II receptor blocker is being taken. She does not see a podiatrist. Headache  This is a chronic problem. The quality of the pain is described as aching. She has tried nothing for the symptoms.     Past Medical History:  Diagnosis Date  . Anemia   . Asthma   . Back pain    02/2019 getting injections  . Carpal tunnel syndrome   . Diabetes mellitus    type 2   . Fibroid tumor   . Ganglion cyst   . GERD (gastroesophageal reflux disease)    will see Dr. Tammi Klippel 04/2018  . Headache(784.0)   . Heart murmur    saw dr. Einar Gip (only sees if needed, not seen since 2011)   . Hypertension    dr Einar Gip     . Hypothyroidism   . Shortness of breath    on exertion   . Shoulder pain   . Tendonitis    left shoulder biceps tendonitis, acromioclavicular osteoarthritis  . Tennis elbow   . Thyroid disease      Family History  Problem Relation Age of Onset  . Heart disease Mother   . Heart disease Father   . Breast cancer Sister   . Heart disease Sister   . Heart disease Brother   . Heart disease Paternal Uncle      Current Outpatient Medications:  .  AIMOVIG 70 MG/ML SOAJ, INJECT 70 MG INTO THE SKIN EVERY 30 DAYS, Disp: 3 pen, Rfl: 0 .  albuterol (PROVENTIL HFA;VENTOLIN HFA) 108 (90 BASE) MCG/ACT inhaler, Inhale 2 puffs into the lungs every 6 (six) hours as needed for wheezing or shortness of breath. , Disp: , Rfl:  .  albuterol (PROVENTIL) (2.5 MG/3ML) 0.083% nebulizer solution, Take 2.5 mg by nebulization every 6 (six) hours as needed for wheezing or shortness of breath. , Disp: , Rfl:  .  beclomethasone (QVAR) 80 MCG/ACT inhaler, Inhale 1 puff into the lungs daily. , Disp: , Rfl:  .  desloratadine (CLARINEX) 5 MG tablet, Take 1 tablet (5 mg total)  by mouth daily., Disp: 90 tablet, Rfl: 0 .  DEXILANT 60 MG capsule, TAKE ONE CAPSULE BY MOUTH EVERY DAY, Disp: 90 capsule, Rfl: 0 .  Erenumab-aooe (AIMOVIG) 140 MG/ML SOAJ, Inject 140 mg into the skin every 30 (thirty) days., Disp: 3 pen, Rfl: 4 .  HUMALOG KWIKPEN 100 UNIT/ML KiwkPen, Inject 5-18 Units into the skin 3 (three) times daily. Based on sliding scale 70-100= 5 units, 101-150 = 7 units, 151-200 = 9 units, 201-250 = 11 units, 251-300 = 13 units, 301-350 = 15 units, > 350= 18 units, Disp: , Rfl:  .  HUMULIN N KWIKPEN 100 UNIT/ML Kiwkpen, Inject 45-55 Units into the skin See admin instructions. Inject 55 units SQ in the morning and inject 45 units SQ in the evening, Disp: , Rfl:  .  hydrocortisone cream 0.5 %, Apply 1 application topically 2 (two) times daily. , Disp: , Rfl:  .  Lactobacillus TABS, Take 1  tablet by mouth daily., Disp: 90 tablet, Rfl: 1 .  levothyroxine (SYNTHROID, LEVOTHROID) 75 MCG tablet, Take 75 mcg by mouth daily before breakfast. , Disp: , Rfl:  .  oxyCODONE-acetaminophen (PERCOCET) 10-325 MG tablet, Take 1 tablet by mouth every 6 (six) hours as needed for pain., Disp: , Rfl:  .  oxymorphone (OPANA ER) 30 MG 12 hr tablet, Take 30 mg by mouth every 12 (twelve) hours. , Disp: , Rfl: 0 .  Polyethyl Glycol-Propyl Glycol (SYSTANE) 0.4-0.3 % SOLN, Place 1 drop into both eyes daily., Disp: , Rfl:  .  polyethylene glycol (MIRALAX / GLYCOLAX) packet, Take 17 g by mouth daily. (Patient taking differently: Take 17 g by mouth daily as needed for mild constipation. ), Disp: 24 each, Rfl: 0 .  rosuvastatin (CRESTOR) 40 MG tablet, Take 40 mg by mouth daily., Disp: , Rfl:  .  topiramate (TOPAMAX) 100 MG tablet, Take 1 tablet (100 mg total) by mouth 2 (two) times daily., Disp: 180 tablet, Rfl: 4   Allergies  Allergen Reactions  . Aspirin Swelling  . Sulfa Antibiotics Swelling  . Demerol Rash  . Penicillins Rash and Other (See Comments)    Has patient had a PCN reaction causing immediate rash, facial/tongue/throat swelling, SOB or lightheadedness with hypotension: No Has patient had a PCN reaction causing severe rash involving mucus membranes or skin necrosis: No Has patient had a PCN reaction that required hospitalization: Yes Has patient had a PCN reaction occurring within the last 10 years: No  If all of the above answers are "NO", then may proceed with Cephalosporin use.      Review of Systems  Neurological: Positive for headaches.     Today's Vitals   04/12/19 0855  BP: 116/82  Pulse: (!) 104  Temp: 98.9 F (37.2 C)  Weight: 153 lb 9.6 oz (69.7 kg)  Height: 5' 0.6" (1.539 m)   Body mass index is 29.41 kg/m.   Objective:  Physical Exam Constitutional:      General: She is not in acute distress.    Appearance: Normal appearance. She is well-developed. She is obese.   Cardiovascular:     Rate and Rhythm: Normal rate and regular rhythm.     Pulses: Normal pulses.     Heart sounds: Normal heart sounds. No murmur.  Pulmonary:     Effort: Pulmonary effort is normal. No respiratory distress.     Breath sounds: Normal breath sounds.  Chest:     Chest wall: No tenderness.  Musculoskeletal:  General: Normal range of motion.  Skin:    General: Skin is warm and dry.     Capillary Refill: Capillary refill takes less than 2 seconds.  Neurological:     General: No focal deficit present.     Mental Status: She is alert and oriented to person, place, and time.     Cranial Nerves: No cranial nerve deficit.  Psychiatric:        Mood and Affect: Mood normal.        Behavior: Behavior normal.        Thought Content: Thought content normal.        Judgment: Judgment normal.         Assessment And Plan:     1. Uncontrolled type 2 diabetes mellitus with hyperglycemia ([place])  She is being followed by Dr. Chalmers Cater reports her HgbA1c is improved, will get records from Dr. Chalmers Cater  Diabetic foot exam done normal, advised to do regular foot checks and keep feet moisturizers - HUMULIN N KWIKPEN 100 UNIT/ML Kiwkpen; Inject 45-55 Units into the skin See admin instructions. Inject 55 units SQ in the morning and inject 30 units SQ in the evening  Dispense: 15 mL - CMP14+EGFR - Lipid panel - Hemoglobin A1c  2. Hypothyroidism, unspecified type  Chronic, she is being followed by Dr. Chalmers Cater - HUMULIN N KWIKPEN 100 UNIT/ML Mayer Masker; Inject 45-55 Units into the skin See admin instructions. Inject 55 units SQ in the morning and inject 30 units SQ in the evening  Dispense: 15 mL  3. Essential hypertension  Chronic, fair control  Continue with current medications  4. Encounter for hepatitis C screening test for low risk patient  Will check for Hepatitis C screening due to being born between the years 20-1966 - Hepatitis C antibody  5. Chronic nonintractable  headache, unspecified headache type  She is being followed by Dr. Leta Baptist  I tried to talk with her about using an abortive medication however she states none of them work and she does not want to try any.   She is to follow up with Dr. Leta Baptist in 1 year  She did have her Amoivig increased to 140 mg monthly.     Minette Brine, FNP    THE PATIENT IS ENCOURAGED TO PRACTICE SOCIAL DISTANCING DUE TO THE COVID-19 PANDEMIC.

## 2019-04-12 NOTE — Patient Instructions (Signed)
Brianna Mitchell , Thank you for taking time to come for your Medicare Wellness Visit. I appreciate your ongoing commitment to your health goals. Please review the following plan we discussed and let me know if I can assist you in the future.   Screening recommendations/referrals: Colonoscopy: decline Mammogram: decline Bone Density: n/a Recommended yearly ophthalmology/optometry visit for glaucoma screening and checkup Recommended yearly dental visit for hygiene and checkup  Vaccinations: Influenza vaccine: 11/2018 Pneumococcal vaccine: 12/2012 Tdap vaccine: 12/2012 Shingles vaccine: decline    Advanced directives: Advance directive discussed with you today. Even though you declined this today please call our office should you change your mind and we can give you the proper paperwork for you to fill out.   Conditions/risks identified: overweight  Next appointment: 08/13/2019 at 8:30  Preventive Care 40-64 Years, Female Preventive care refers to lifestyle choices and visits with your health care provider that can promote health and wellness. What does preventive care include?  A yearly physical exam. This is also called an annual well check.  Dental exams once or twice a year.  Routine eye exams. Ask your health care provider how often you should have your eyes checked.  Personal lifestyle choices, including:  Daily care of your teeth and gums.  Regular physical activity.  Eating a healthy diet.  Avoiding tobacco and drug use.  Limiting alcohol use.  Practicing safe sex.  Taking low-dose aspirin daily starting at age 57.  Taking vitamin and mineral supplements as recommended by your health care provider. What happens during an annual well check? The services and screenings done by your health care provider during your annual well check will depend on your age, overall health, lifestyle risk factors, and family history of disease. Counseling  Your health care provider may  ask you questions about your:  Alcohol use.  Tobacco use.  Drug use.  Emotional well-being.  Home and relationship well-being.  Sexual activity.  Eating habits.  Work and work Statistician.  Method of birth control.  Menstrual cycle.  Pregnancy history. Screening  You may have the following tests or measurements:  Height, weight, and BMI.  Blood pressure.  Lipid and cholesterol levels. These may be checked every 5 years, or more frequently if you are over 23 years old.  Skin check.  Lung cancer screening. You may have this screening every year starting at age 61 if you have a 30-pack-year history of smoking and currently smoke or have quit within the past 15 years.  Fecal occult blood test (FOBT) of the stool. You may have this test every year starting at age 31.  Flexible sigmoidoscopy or colonoscopy. You may have a sigmoidoscopy every 5 years or a colonoscopy every 10 years starting at age 24.  Hepatitis C blood test.  Hepatitis B blood test.  Sexually transmitted disease (STD) testing.  Diabetes screening. This is done by checking your blood sugar (glucose) after you have not eaten for a while (fasting). You may have this done every 1-3 years.  Mammogram. This may be done every 1-2 years. Talk to your health care provider about when you should start having regular mammograms. This may depend on whether you have a family history of breast cancer.  BRCA-related cancer screening. This may be done if you have a family history of breast, ovarian, tubal, or peritoneal cancers.  Pelvic exam and Pap test. This may be done every 3 years starting at age 62. Starting at age 88, this may be done every 5 years  if you have a Pap test in combination with an HPV test.  Bone density scan. This is done to screen for osteoporosis. You may have this scan if you are at high risk for osteoporosis. Discuss your test results, treatment options, and if necessary, the need for more  tests with your health care provider. Vaccines  Your health care provider may recommend certain vaccines, such as:  Influenza vaccine. This is recommended every year.  Tetanus, diphtheria, and acellular pertussis (Tdap, Td) vaccine. You may need a Td booster every 10 years.  Zoster vaccine. You may need this after age 38.  Pneumococcal 13-valent conjugate (PCV13) vaccine. You may need this if you have certain conditions and were not previously vaccinated.  Pneumococcal polysaccharide (PPSV23) vaccine. You may need one or two doses if you smoke cigarettes or if you have certain conditions. Talk to your health care provider about which screenings and vaccines you need and how often you need them. This information is not intended to replace advice given to you by your health care provider. Make sure you discuss any questions you have with your health care provider. Document Released: 02/28/2015 Document Revised: 10/22/2015 Document Reviewed: 12/03/2014 Elsevier Interactive Patient Education  2017 [place] Prevention in the Home Falls can cause injuries. They can happen to people of all ages. There are many things you can do to make your home safe and to help prevent falls. What can I do on the outside of my home?  Regularly fix the edges of walkways and driveways and fix any cracks.  Remove anything that might make you trip as you walk through a door, such as a raised step or threshold.  Trim any bushes or trees on the path to your home.  Use bright outdoor lighting.  Clear any walking paths of anything that might make someone trip, such as rocks or tools.  Regularly check to see if handrails are loose or broken. Make sure that both sides of any steps have handrails.  Any raised decks and porches should have guardrails on the edges.  Have any leaves, snow, or ice cleared regularly.  Use sand or salt on walking paths during winter.  Clean up any spills in your  garage right away. This includes oil or grease spills. What can I do in the bathroom?  Use night lights.  Install grab bars by the toilet and in the tub and shower. Do not use towel bars as grab bars.  Use non-skid mats or decals in the tub or shower.  If you need to sit down in the shower, use a plastic, non-slip stool.  Keep the floor dry. Clean up any water that spills on the floor as soon as it happens.  Remove soap buildup in the tub or shower regularly.  Attach bath mats securely with double-sided non-slip rug tape.  Do not have throw rugs and other things on the floor that can make you trip. What can I do in the bedroom?  Use night lights.  Make sure that you have a light by your bed that is easy to reach.  Do not use any sheets or blankets that are too big for your bed. They should not hang down onto the floor.  Have a firm chair that has side arms. You can use this for support while you get dressed.  Do not have throw rugs and other things on the floor that can make you trip. What can I do in  the kitchen?  Clean up any spills right away.  Avoid walking on wet floors.  Keep items that you use a lot in easy-to-reach places.  If you need to reach something above you, use a strong step stool that has a grab bar.  Keep electrical cords out of the way.  Do not use floor polish or wax that makes floors slippery. If you must use wax, use non-skid floor wax.  Do not have throw rugs and other things on the floor that can make you trip. What can I do with my stairs?  Do not leave any items on the stairs.  Make sure that there are handrails on both sides of the stairs and use them. Fix handrails that are broken or loose. Make sure that handrails are as long as the stairways.  Check any carpeting to make sure that it is firmly attached to the stairs. Fix any carpet that is loose or worn.  Avoid having throw rugs at the top or bottom of the stairs. If you do have throw  rugs, attach them to the floor with carpet tape.  Make sure that you have a light switch at the top of the stairs and the bottom of the stairs. If you do not have them, ask someone to add them for you. What else can I do to help prevent falls?  Wear shoes that:  Do not have high heels.  Have rubber bottoms.  Are comfortable and fit you well.  Are closed at the toe. Do not wear sandals.  If you use a stepladder:  Make sure that it is fully opened. Do not climb a closed stepladder.  Make sure that both sides of the stepladder are locked into place.  Ask someone to hold it for you, if possible.  Clearly mark and make sure that you can see:  Any grab bars or handrails.  First and last steps.  Where the edge of each step is.  Use tools that help you move around (mobility aids) if they are needed. These include:  Canes.  Walkers.  Scooters.  Crutches.  Turn on the lights when you go into a dark area. Replace any light bulbs as soon as they burn out.  Set up your furniture so you have a clear path. Avoid moving your furniture around.  If any of your floors are uneven, fix them.  If there are any pets around you, be aware of where they are.  Review your medicines with your doctor. Some medicines can make you feel dizzy. This can increase your chance of falling. Ask your doctor what other things that you can do to help prevent falls. This information is not intended to replace advice given to you by your health care provider. Make sure you discuss any questions you have with your health care provider. Document Released: 11/28/2008 Document Revised: 07/10/2015 Document Reviewed: 03/08/2014 Elsevier Interactive Patient Education  2017 [employer].

## 2019-04-16 ENCOUNTER — Encounter: Payer: Self-pay | Admitting: Nurse Practitioner

## 2019-04-27 DIAGNOSIS — J45998 Other asthma: Secondary | ICD-10-CM | POA: Diagnosis not present

## 2019-05-23 DIAGNOSIS — J45998 Other asthma: Secondary | ICD-10-CM | POA: Diagnosis not present

## 2019-05-28 DIAGNOSIS — J45998 Other asthma: Secondary | ICD-10-CM | POA: Diagnosis not present

## 2019-06-20 DIAGNOSIS — M25511 Pain in right shoulder: Secondary | ICD-10-CM | POA: Diagnosis not present

## 2019-06-26 ENCOUNTER — Telehealth: Payer: Self-pay | Admitting: Nurse Practitioner

## 2019-06-26 NOTE — Chronic Care Management (AMB) (Signed)
  Chronic Care Management   Note  06/26/2019 Name: [name] MRN: [id number] DOB: Nov 02, 1955  Brianna Mitchell is a 64 y.o. year old female who is a primary care patient of Minette Brine, [place]. I reached out to Brianna Mitchell by phone today in response to a referral sent by Ms. Chariti A Mika's health plan.     Ms. Manges was given information about Chronic Care Management services today including:  1. CCM service includes personalized support from designated clinical staff supervised by her physician, including individualized plan of care and coordination with other care providers 2. 24/7 contact phone numbers for assistance for urgent and routine care needs. 3. Service will only be billed when office clinical staff spend 20 minutes or more in a month to coordinate care. 4. Only one practitioner may furnish and bill the service in a calendar month. 5. The patient may stop CCM services at any time (effective at the end of the month) by phone call to the office staff. 6. The patient will be responsible for cost sharing (co-pay) of up to 20% of the service fee (after annual deductible is met).  Patient did not agree to enrollment in care management services and does not wish to consider at this time.  Follow up plan: The care management team is available to follow up with the patient after provider conversation with the patient regarding recommendation for care management engagement and subsequent re-referral to the care management team.  The patient has been provided with contact information for the care management team and has been advised to call with any health related questions or concerns.   [place], [place] [address] Direct Dial: [phone] [email or web address] Website: [email or web address]

## 2019-06-27 DIAGNOSIS — J45998 Other asthma: Secondary | ICD-10-CM | POA: Diagnosis not present

## 2019-06-28 DIAGNOSIS — J45998 Other asthma: Secondary | ICD-10-CM | POA: Diagnosis not present

## 2019-07-04 ENCOUNTER — Other Ambulatory Visit: Payer: Self-pay | Admitting: Nurse Practitioner

## 2019-08-06 ENCOUNTER — Other Ambulatory Visit: Payer: Self-pay | Admitting: Nurse Practitioner

## 2019-08-13 ENCOUNTER — Ambulatory Visit: Payer: Self-pay | Admitting: Nurse Practitioner

## 2019-08-16 DIAGNOSIS — J45998 Other asthma: Secondary | ICD-10-CM | POA: Diagnosis not present

## 2019-09-25 DIAGNOSIS — M5416 Radiculopathy, lumbar region: Secondary | ICD-10-CM | POA: Diagnosis not present

## 2019-09-25 DIAGNOSIS — M5136 Other intervertebral disc degeneration, lumbar region: Secondary | ICD-10-CM | POA: Diagnosis not present

## 2019-09-25 DIAGNOSIS — Z981 Arthrodesis status: Secondary | ICD-10-CM | POA: Diagnosis not present

## 2019-09-25 DIAGNOSIS — Z79891 Long term (current) use of opiate analgesic: Secondary | ICD-10-CM | POA: Diagnosis not present

## 2019-09-25 DIAGNOSIS — G894 Chronic pain syndrome: Secondary | ICD-10-CM | POA: Diagnosis not present

## 2019-09-28 DIAGNOSIS — J45998 Other asthma: Secondary | ICD-10-CM | POA: Diagnosis not present

## 2019-10-10 ENCOUNTER — Other Ambulatory Visit: Payer: Self-pay

## 2019-10-10 MED ORDER — DEXILANT 60 MG PO CPDR: 1.0000 | DELAYED_RELEASE_CAPSULE | Freq: Every day | ORAL | 0 refills | Status: DC

## 2019-10-16 DIAGNOSIS — M5136 Other intervertebral disc degeneration, lumbar region: Secondary | ICD-10-CM | POA: Diagnosis not present

## 2019-10-16 DIAGNOSIS — Z5181 Encounter for therapeutic drug level monitoring: Secondary | ICD-10-CM | POA: Diagnosis not present

## 2019-10-16 DIAGNOSIS — M961 Postlaminectomy syndrome, not elsewhere classified: Secondary | ICD-10-CM | POA: Diagnosis not present

## 2019-10-16 DIAGNOSIS — Z79891 Long term (current) use of opiate analgesic: Secondary | ICD-10-CM | POA: Diagnosis not present

## 2019-12-10 DIAGNOSIS — M961 Postlaminectomy syndrome, not elsewhere classified: Secondary | ICD-10-CM | POA: Diagnosis not present

## 2019-12-21 DIAGNOSIS — M545 Low back pain, unspecified: Secondary | ICD-10-CM | POA: Diagnosis not present

## 2019-12-28 DIAGNOSIS — M5416 Radiculopathy, lumbar region: Secondary | ICD-10-CM | POA: Diagnosis not present

## 2020-01-18 ENCOUNTER — Other Ambulatory Visit: Payer: Self-pay | Admitting: Nurse Practitioner

## 2020-01-31 DIAGNOSIS — M5416 Radiculopathy, lumbar region: Secondary | ICD-10-CM | POA: Diagnosis not present

## 2020-02-19 ENCOUNTER — Telehealth: Payer: Self-pay | Admitting: Neurology

## 2020-02-19 NOTE — Telephone Encounter (Signed)
PA submitted for the patient through cover my meds/ [facility]. KEYGarlan Mitchell  Received a notification, "medication or product was previously approved on [id number] from 2020-02-16 to 2021-02-14"

## 2020-02-28 ENCOUNTER — Ambulatory Visit: Payer: Medicare Other | Admitting: Family Medicine

## 2020-03-11 DIAGNOSIS — M25511 Pain in right shoulder: Secondary | ICD-10-CM | POA: Diagnosis not present

## 2020-03-11 DIAGNOSIS — G894 Chronic pain syndrome: Secondary | ICD-10-CM | POA: Diagnosis not present

## 2020-03-11 DIAGNOSIS — M5136 Other intervertebral disc degeneration, lumbar region: Secondary | ICD-10-CM | POA: Diagnosis not present

## 2020-03-27 DIAGNOSIS — E041 Nontoxic single thyroid nodule: Secondary | ICD-10-CM | POA: Diagnosis not present

## 2020-03-27 DIAGNOSIS — E1165 Type 2 diabetes mellitus with hyperglycemia: Secondary | ICD-10-CM | POA: Diagnosis not present

## 2020-03-27 DIAGNOSIS — E039 Hypothyroidism, unspecified: Secondary | ICD-10-CM | POA: Diagnosis not present

## 2020-03-27 DIAGNOSIS — I10 Essential (primary) hypertension: Secondary | ICD-10-CM | POA: Diagnosis not present

## 2020-04-04 DIAGNOSIS — J45998 Other asthma: Secondary | ICD-10-CM | POA: Diagnosis not present

## 2020-04-11 ENCOUNTER — Other Ambulatory Visit: Payer: Self-pay | Admitting: Nurse Practitioner

## 2020-04-17 ENCOUNTER — Ambulatory Visit: Payer: Self-pay | Admitting: Nurse Practitioner

## 2020-04-25 DIAGNOSIS — Z961 Presence of intraocular lens: Secondary | ICD-10-CM | POA: Diagnosis not present

## 2020-04-25 DIAGNOSIS — H26493 Other secondary cataract, bilateral: Secondary | ICD-10-CM | POA: Diagnosis not present

## 2020-04-25 DIAGNOSIS — H26492 Other secondary cataract, left eye: Secondary | ICD-10-CM | POA: Diagnosis not present

## 2020-04-25 DIAGNOSIS — H18413 Arcus senilis, bilateral: Secondary | ICD-10-CM | POA: Diagnosis not present

## 2020-04-25 DIAGNOSIS — E119 Type 2 diabetes mellitus without complications: Secondary | ICD-10-CM | POA: Diagnosis not present

## 2020-04-29 ENCOUNTER — Encounter: Payer: Self-pay | Admitting: Family Medicine

## 2020-04-29 ENCOUNTER — Telehealth: Payer: Self-pay

## 2020-04-29 ENCOUNTER — Other Ambulatory Visit: Payer: Self-pay | Admitting: Diagnostic Neuroimaging

## 2020-04-29 ENCOUNTER — Ambulatory Visit (INDEPENDENT_AMBULATORY_CARE_PROVIDER_SITE_OTHER): Payer: Medicare Other | Admitting: Family Medicine

## 2020-04-29 ENCOUNTER — Other Ambulatory Visit: Payer: Self-pay | Admitting: Family Medicine

## 2020-04-29 VITALS — BP 154/99 | HR 86 | Ht <= 58 in | Wt 148.0 lb

## 2020-04-29 DIAGNOSIS — G43019 Migraine without aura, intractable, without status migrainosus: Secondary | ICD-10-CM

## 2020-04-29 MED ORDER — AJOVY 225 MG/1.5ML ~~LOC~~ SOAJ: 225.0000 mg | SUBCUTANEOUS | 3 refills | Status: DC

## 2020-04-29 NOTE — Patient Instructions (Signed)
Below is our plan:  We will change Amovig to Ajovy. Take 1 injection every 30 days. Continue topiramate 100mg  twice daily.   Please make sure you are staying well hydrated. I recommend 50-60 ounces daily. Well balanced diet and regular exercise encouraged. Consistent sleep schedule with 6-8 hours recommended.   Please continue follow up with care team as directed.   Follow up in 1 year if tolerating medication well. May call sooner if needed   You may receive a survey regarding today's visit. I encourage you to leave honest feed back as I do use this information to improve patient care. Thank you for seeing me today!    Fremanezumab injection What is this medicine? FREMANEZUMAB (fre ma NEZ ue mab) is used to prevent migraine headaches. This medicine may be used for other purposes; ask your health care provider or pharmacist if you have questions. COMMON BRAND NAME(S): AJOVY What should I tell my health care provider before I take this medicine? They need to know if you have any of these conditions:  an unusual or allergic reaction to fremanezumab, other medicines, foods, dyes, or preservatives  pregnant or trying to get pregnant  breast-feeding How should I use this medicine? This medicine is for injection under the skin. You will be taught how to prepare and give this medicine. Use exactly as directed. Take your medicine at regular intervals. Do not take your medicine more often than directed. It is important that you put your used needles and syringes in a special sharps container. Do not put them in a trash can. If you do not have a sharps container, call your pharmacist or healthcare provider to get one. Talk to your pediatrician regarding the use of this medicine in children. Special care may be needed. Overdosage: If you think you have taken too much of this medicine contact a poison control center or emergency room at once. NOTE: This medicine is only for you. Do not share  this medicine with others. What if I miss a dose? If you miss a dose, take it as soon as you can. If it is almost time for your next dose, take only that dose. Do not take double or extra doses. What may interact with this medicine? Interactions are not expected. This list may not describe all possible interactions. Give your health care provider a list of all the medicines, herbs, non-prescription drugs, or dietary supplements you use. Also tell them if you smoke, drink alcohol, or use illegal drugs. Some items may interact with your medicine. What should I watch for while using this medicine? Tell your doctor or healthcare professional if your symptoms do not start to get better or if they get worse. What side effects may I notice from receiving this medicine? Side effects that you should report to your doctor or health care professional as soon as possible:  allergic reactions like skin rash, itching or hives, swelling of the face, lips, or tongue Side effects that usually do not require medical attention (report these to your doctor or health care professional if they continue or are bothersome):  pain, redness, or irritation at site where injected This list may not describe all possible side effects. Call your doctor for medical advice about side effects. You may report side effects to FDA at 1-800-FDA-1088. Where should I keep my medicine? Keep out of the reach of children. You will be instructed on how to store this medicine. Throw away any unused medicine after the expiration date  on the label. NOTE: This sheet is a summary. It may not cover all possible information. If you have questions about this medicine, talk to your doctor, pharmacist, or health care provider.  2021 Elsevier/Gold Standard (2016-11-01 17:22:56)    Sleep Apnea Sleep apnea affects breathing during sleep. It causes breathing to stop for a short time or to become shallow. It can also increase the risk of:  Heart  attack.  Stroke.  Being very overweight (obese).  Diabetes.  Heart failure.  Irregular heartbeat. The goal of treatment is to help you breathe normally again. What are the causes? There are three kinds of sleep apnea:  Obstructive sleep apnea. This is caused by a blocked or collapsed airway.  Central sleep apnea. This happens when the brain does not send the right signals to the muscles that control breathing.  Mixed sleep apnea. This is a combination of obstructive and central sleep apnea. The most common cause of this condition is a collapsed or blocked airway. This can happen if:  Your throat muscles are too relaxed.  Your tongue and tonsils are too large.  You are overweight.  Your airway is too small.   What increases the risk?  Being overweight.  Smoking.  Having a small airway.  Being older.  Being female.  Drinking alcohol.  Taking medicines to calm yourself (sedatives or tranquilizers).  Having family members with the condition. What are the signs or symptoms?  Trouble staying asleep.  Being sleepy or tired during the day.  Getting angry a lot.  Loud snoring.  Headaches in the morning.  Not being able to focus your mind (concentrate).  Forgetting things.  Less interest in sex.  Mood swings.  Personality changes.  Feelings of sadness (depression).  Waking up a lot during the night to pee (urinate).  Dry mouth.  Sore throat. How is this diagnosed?  Your medical history.  A physical exam.  A test that is done when you are sleeping (sleep study). The test is most often done in a sleep lab but may also be done at home. How is this treated?  Sleeping on your side.  Using a medicine to get rid of mucus in your nose (decongestant).  Avoiding the use of alcohol, medicines to help you relax, or certain pain medicines (narcotics).  Losing weight, if needed.  Changing your diet.  Not smoking.  Using a machine to open your  airway while you sleep, such as: ? An oral appliance. This is a mouthpiece that shifts your lower jaw forward. ? A CPAP device. This device blows air through a mask when you breathe out (exhale). ? An EPAP device. This has valves that you put in each nostril. ? A BPAP device. This device blows air through a mask when you breathe in (inhale) and breathe out.  Having surgery if other treatments do not work. It is important to get treatment for sleep apnea. Without treatment, it can lead to:  High blood pressure.  Coronary artery disease.  In men, not being able to have an erection (impotence).  Reduced thinking ability.   Follow these instructions at home: Lifestyle  Make changes that your doctor recommends.  Eat a healthy diet.  Lose weight if needed.  Avoid alcohol, medicines to help you relax, and some pain medicines.  Do not use any products that contain nicotine or tobacco, such as cigarettes, e-cigarettes, and chewing tobacco. If you need help quitting, ask your doctor. General instructions  Take over-the-counter and  prescription medicines only as told by your doctor.  If you were given a machine to use while you sleep, use it only as told by your doctor.  If you are having surgery, make sure to tell your doctor you have sleep apnea. You may need to bring your device with you.  Keep all follow-up visits as told by your doctor. This is important. Contact a doctor if:  The machine that you were given to use during sleep bothers you or does not seem to be working.  You do not get better.  You get worse. Get help right away if:  Your chest hurts.  You have trouble breathing in enough air.  You have an uncomfortable feeling in your back, arms, or stomach.  You have trouble talking.  One side of your body feels weak.  A part of your face is hanging down. These symptoms may be an emergency. Do not wait to see if the symptoms will go away. Get medical help right  away. Call your local emergency services (911 in the [place] Do not drive yourself to the hospital. Summary  This condition affects breathing during sleep.  The most common cause is a collapsed or blocked airway.  The goal of treatment is to help you breathe normally while you sleep. This information is not intended to replace advice given to you by your health care provider. Make sure you discuss any questions you have with your health care provider. Document Revised: 11/18/2017 Document Reviewed: 09/27/2017 Elsevier Patient Education  2021 [place].   Analgesic Rebound Headache An analgesic rebound headache, sometimes called a medication overuse headache or a drug-induced headache, is a secondary disorder that is caused by the overuse of pain medicine (analgesic) to treat the original (primary) headache. Any type of primary headache can return as a rebound headache if a person regularly takes analgesics. The types of primary headaches that are commonly associated with rebound headaches include:  Migraines.  Headaches that are caused by tense muscles in the head and neck area (tension headaches).  Headaches that develop and happen again on one side of the head and around the eye (cluster headaches). If rebound headaches continue, they can become long-term, daily headaches. What are the causes? This condition may be caused by frequent use of:  Over-the-counter medicines such as aspirin, ibuprofen, and acetaminophen.  Sinus-relief medicines and medicines that contain caffeine.  Narcotic pain medicines such as codeine and oxycodone.  Some prescription migraine medicines. What are the signs or symptoms? The symptoms of a rebound headache are the same as the symptoms of the original headache. Some of the symptoms of specific types of headaches include: Migraine headache  Pulsing or throbbing pain on one or both sides of the head.  Severe pain that interferes with daily  activities.  Pain that gets worse with physical activity.  Nausea, vomiting, or both.  Pain and sensitivity with exposure to bright light, loud noises, or strong smells.  Visual changes.  Numbness of one or both arms. Tension headache  Pressure around the head.  Dull, aching head pain.  Pain felt over the front and sides of the head.  Tenderness in the muscles of the head, neck, and shoulders. Cluster headache  Severe pain that begins in or around one eye or temple.  Droopy or swollen eyelid, or redness and tearing in the eye on the same side as the pain.  One-sided head pain.  Nausea.  Runny nose.  Sweaty, pale facial skin.  Restlessness. How is this diagnosed? This condition is diagnosed by:  Reviewing your medical history. This includes the nature of your primary headaches.  Reviewing the types of pain medicines that you have been using to treat your primary headaches and how often you take them. How is this treated? This condition may be treated or managed by:  Discontinuing frequent use of the analgesic medicine. Doing this may worsen your headaches at first, but the pain should eventually become more manageable, less frequent, and less severe.  Seeing a headache specialist. He or she may be able to help you manage your headaches and help make sure there is not another cause of the headaches.  Using methods of stress relief, such as acupuncture, counseling, biofeedback, and massage. Talk with your health care provider about which methods might be good for you. Follow these instructions at home: Medicines  Take over-the-counter and prescription medicines only as told by your health care provider.  Stop the repeated use of pain medicine as told by your health care provider. Stopping can be difficult. Carefully follow instructions from your health care provider.   Lifestyle  Follow a regular sleep schedule. Do not vary the time that you go to bed or the  amount that you sleep from day to day. It is important to stay on the same schedule to help prevent headaches. Get 7-9 hours of sleep each night, or the amount recommended by your health care provider.  Exercise regularly. Exercise for at least 30 minutes, 5 times each week.  Limit or manage stress. Consider stress-relief options such as acupuncture, counseling, biofeedback, and massage. Talk with your health care provider about which methods might be good for you.  Do not drink alcohol.  Do not use any products that contain nicotine or tobacco, such as cigarettes, e-cigarettes, and chewing tobacco. If you need help quitting, ask your health care provider.   General instructions  Avoid triggers that are known to cause your primary headaches.  Keep all follow-up visits as told by your health care provider. This is important. Contact a health care provider if:  You continue to experience headaches after following treatments that your health care provider recommended. Get help right away if you have:  New headache pain.  Headache pain that is different than what you have experienced in the past.  Numbness or tingling in your arms or legs.  Changes in your speech or vision. Summary  An analgesic rebound headache, sometimes called a medication overuse headache or a drug-induced headache, is caused by the overuse of pain medicine (analgesic) to treat the original (primary) headache.  Any type of primary headache can return as a rebound headache if a person regularly takes analgesics. The types of primary headaches that are commonly associated with rebound headaches include migraines, tension headaches, and cluster headaches.  Analgesic rebound headaches can occur with frequent use of over-the-counter medicines and prescription medicines.  Treatment involves stopping the medicine that is being overused. This will improve headache frequency and severity. This information is not intended to  replace advice given to you by your health care provider. Make sure you discuss any questions you have with your health care provider. Document Revised: 03/01/2019 Document Reviewed: 03/01/2019 Elsevier Patient Education  2021 [place].    Migraine Headache A migraine headache is a very strong throbbing pain on one side or both sides of your head. This type of headache can also cause other symptoms. It can last from 4 hours to 3 days.  Talk with your doctor about what things may bring on (trigger) this condition. What are the causes? The exact cause of this condition is not known. This condition may be triggered or caused by:  Drinking alcohol.  Smoking.  Taking medicines, such as: ? Medicine used to treat chest pain (nitroglycerin). ? Birth control pills. ? Estrogen. ? Some blood pressure medicines.  Eating or drinking certain products.  Doing physical activity. Other things that may trigger a migraine headache include:  Having a menstrual period.  Pregnancy.  Hunger.  Stress.  Not getting enough sleep or getting too much sleep.  Weather changes.  Tiredness (fatigue). What increases the risk?  Being 58-66 years old.  Being female.  Having a family history of migraine headaches.  Being Caucasian.  Having depression or anxiety.  Being very overweight. What are the signs or symptoms?  A throbbing pain. This pain may: ? Happen in any area of the head, such as on one side or both sides. ? Make it hard to do daily activities. ? Get worse with physical activity. ? Get worse around bright lights or loud noises.  Other symptoms may include: ? Feeling sick to your stomach (nauseous). ? Vomiting. ? Dizziness. ? Being sensitive to bright lights, loud noises, or smells.  Before you get a migraine headache, you may get warning signs (an aura). An aura may include: ? Seeing flashing lights or having blind spots. ? Seeing bright spots, halos, or zigzag  lines. ? Having tunnel vision or blurred vision. ? Having numbness or a tingling feeling. ? Having trouble talking. ? Having weak muscles.  Some people have symptoms after a migraine headache (postdromal phase), such as: ? Tiredness. ? Trouble thinking (concentrating). How is this treated?  Taking medicines that: ? Relieve pain. ? Relieve the feeling of being sick to your stomach. ? Prevent migraine headaches.  Treatment may also include: ? Having acupuncture. ? Avoiding foods that bring on migraine headaches. ? Learning ways to control your body functions (biofeedback). ? Therapy to help you know and deal with negative thoughts (cognitive behavioral therapy). Follow these instructions at home: Medicines  Take over-the-counter and prescription medicines only as told by your doctor.  Ask your doctor if the medicine prescribed to you: ? Requires you to avoid driving or using heavy machinery. ? Can cause trouble pooping (constipation). You may need to take these steps to prevent or treat trouble pooping:  Drink enough fluid to keep your pee (urine) pale yellow.  Take over-the-counter or prescription medicines.  Eat foods that are high in fiber. These include beans, whole grains, and fresh fruits and vegetables.  Limit foods that are high in fat and sugar. These include fried or sweet foods. Lifestyle  Do not drink alcohol.  Do not use any products that contain nicotine or tobacco, such as cigarettes, e-cigarettes, and chewing tobacco. If you need help quitting, ask your doctor.  Get at least 8 hours of sleep every night.  Limit and deal with stress. General instructions  Keep a journal to find out what may bring on your migraine headaches. For example, write down: ? What you eat and drink. ? How much sleep you get. ? Any change in what you eat or drink. ? Any change in your medicines.  If you have a migraine headache: ? Avoid things that make your symptoms worse,  such as bright lights. ? It may help to lie down in a dark, quiet room. ? Do not drive or use  heavy machinery. ? Ask your doctor what activities are safe for you.  Keep all follow-up visits as told by your doctor. This is important.      Contact a doctor if:  You get a migraine headache that is different or worse than others you have had.  You have more than 15 headache days in one month. Get help right away if:  Your migraine headache gets very bad.  Your migraine headache lasts longer than 72 hours.  You have a fever.  You have a stiff neck.  You have trouble seeing.  Your muscles feel weak or like you cannot control them.  You start to lose your balance a lot.  You start to have trouble walking.  You pass out (faint).  You have a seizure. Summary  A migraine headache is a very strong throbbing pain on one side or both sides of your head. These headaches can also cause other symptoms.  This condition may be treated with medicines and changes to your lifestyle.  Keep a journal to find out what may bring on your migraine headaches.  Contact a doctor if you get a migraine headache that is different or worse than others you have had.  Contact your doctor if you have more than 15 headache days in a month. This information is not intended to replace advice given to you by your health care provider. Make sure you discuss any questions you have with your health care provider. Document Revised: 05/26/2018 Document Reviewed: 03/16/2018 Elsevier Patient Education  [place].

## 2020-04-29 NOTE — Telephone Encounter (Signed)
The pt was told that Laurance Flatten, dnp, fnp bc go t a call that the pt's a1c is 12 from the [facility] and needed to be seen.  The pt said that she has an appt with Dr. Chalmers Cater on the 17th for a f/u too.

## 2020-04-29 NOTE — Progress Notes (Signed)
I reviewed note and agree with plan.   Penni Bombard, MD 0/17/4944, 9:67 PM Certified in Neurology, Neurophysiology and Neuroimaging  [facility] [address], [place], [place] [address] [phone]

## 2020-04-29 NOTE — Progress Notes (Signed)
Chief Complaint  Patient presents with  . Follow-up    RM 1 Alone  Pt states her migraines are still the same, they have not improved. She has a headache/migraine everyday.     HISTORY OF PRESENT ILLNESS: 04/29/20 ALL:  Brianna Mitchell is a 64 y.o. female here today for follow up for migraines. She was last seen 02/2019 by Dr Leta Baptist who increased Amovig to 140mg  monthly and topiramate to 100mg  BID. Tylenol advised for abortive therapy. She does feel that it helped with intensity but she continues to have daily headaches. She describes a constant sharp stabbing pain all over her head. Occasionally throbbing with nausea and light/sound sensitivity. She admits to increased stress. Her mother recently suffered a heart attack. She lost several family members to Covid. Blood sugars have been elevated. Blood pressures are elevated. Last A1C was 12. She has appt with endocrinology next week. She does not sleep well. She wakes from dreams. She does snore.  She reports having a sleep study over 10 years ago. Unsure of results. She does not feel she can tolerate CPAP. She has chronic back pain. She is followed by Dr Rolena Infante. She takes Percocet 10-325 QID and Opana 30mg  BID.    HISTORY (copied from Dr Gladstone Lighter previous note)  UPDATE (02/28/19, VRP): Since last visit, HA continue ("all the time"). Tolerating aimovig and topiramate. Symptoms are stable. Severity is moderate. No alleviating or aggravating factors. Tolerating meds.  UPDATE (06/26/18, VRP): Since last visit, doing poorly. HA symptoms are continuing. Severity is high. No alleviating or aggravating factors. Tolerating meds. More stress. Had 3 family members die of COVID 46 (living in [place] and [place]).   UPDATE (11/16/16, VRP): Since last visit, doingpoorly.More back pain, headaches, insomnia.Toleratingtopiramate and rizatriptan.  UPDATE 07/29/15: Since last visit, symptoms stable. Daily HA + intermittent headaches. Patient had to cancel sleep  study due to family issues.  UPDATE 01/01/15: Since last visit, still with daily HA. Also with 5 severe migraines per week.  PRIOR HPI (03/12/14): 65 year old ambidextrous female here for evaluation of headaches. Since age 75 years old patient has had intermittent global, severe throbbing headaches, with blurred vision, irritability, nausea and photophobia and phonophobia. Around 2008 patient was evaluated by [facility], dx'd with migraine, and prescribed topiramate 25 mg twice a day. Patient then followed up with PCP who increased this to 50 mg twice a day for past 2-3 yrs. Patient continues to have multiple low-grade headaches per day. She has 3 severe migraine headaches per week. She typically tries to calm herself, relax, sometimes takes half tablet of generic Excedrin Migraine tablet. She reports allergy to aspirin and this medication has aspirin in it. She reports some itching sensation when she takes it. Patient reports remote sleep study testing more than 10 years ago but does not know the results. She is not on CPAP therapy. She has significant insomnia which she feels is related to her chronic back pain. She has snoring, obesity, HTN and daytime sleepiness. No specific other triggering factors for headache.     REVIEW OF SYSTEMS: Out of a complete 14 system review of symptoms, the patient complains only of the following symptoms, headaches, chronic pain, snoring and all other reviewed systems are negative.    ALLERGIES: Allergies  Allergen Reactions  . Aspirin Swelling  . Sulfa Antibiotics Swelling  . Demerol Rash  . Penicillins Rash and Other (See Comments)    Has patient had a PCN reaction causing immediate rash,  facial/tongue/throat swelling, SOB or lightheadedness with hypotension: No Has patient had a PCN reaction causing severe rash involving mucus membranes or skin necrosis: No Has patient had a PCN reaction that required hospitalization: Yes Has patient had  a PCN reaction occurring within the last 10 years: No  If all of the above answers are "NO", then may proceed with Cephalosporin use.      HOME MEDICATIONS: Outpatient Medications Prior to Visit  Medication Sig Dispense Refill  . albuterol (PROVENTIL HFA;VENTOLIN HFA) 108 (90 BASE) MCG/ACT inhaler Inhale 2 puffs into the lungs every 6 (six) hours as needed for wheezing or shortness of breath.     Marland Kitchen albuterol (PROVENTIL) (2.5 MG/3ML) 0.083% nebulizer solution Take 2.5 mg by nebulization every 6 (six) hours as needed for wheezing or shortness of breath.     . beclomethasone (QVAR) 80 MCG/ACT inhaler Inhale 1 puff into the lungs daily.     Marland Kitchen desloratadine (CLARINEX) 5 MG tablet Take 1 tablet (5 mg total) by mouth daily. 90 tablet 0  . DEXILANT 60 MG capsule Take 1 capsule (60 mg total) by mouth daily. 90 capsule 0  . HUMALOG KWIKPEN 100 UNIT/ML KiwkPen Inject 5-18 Units into the skin 3 (three) times daily. Based on sliding scale 70-100= 5 units, 101-150 = 7 units, 151-200 = 9 units, 201-250 = 11 units, 251-300 = 13 units, 301-350 = 15 units, > 350= 18 units    . HUMULIN N KWIKPEN 100 UNIT/ML Kiwkpen Inject 45-55 Units into the skin See admin instructions. Inject 55 units SQ in the morning and inject 30 units SQ in the evening 15 mL   . hydrocortisone cream 0.5 % Apply 1 application topically 2 (two) times daily.     . Lactobacillus TABS Take 1 tablet by mouth daily. 90 tablet 1  . levothyroxine (SYNTHROID, LEVOTHROID) 75 MCG tablet Take 75 mcg by mouth daily before breakfast.     . oxyCODONE-acetaminophen (PERCOCET) 10-325 MG tablet Take 1 tablet by mouth every 6 (six) hours as needed for pain.    Marland Kitchen oxymorphone (OPANA ER) 30 MG 12 hr tablet Take 30 mg by mouth every 12 (twelve) hours.   0  . Polyethyl Glycol-Propyl Glycol 0.4-0.3 % SOLN Place 1 drop into both eyes daily.    . polyethylene glycol (MIRALAX / GLYCOLAX) packet Take 17 g by mouth daily. (Patient taking differently: Take 17 g by mouth  daily as needed for mild constipation.) 24 each 0  . rosuvastatin (CRESTOR) 40 MG tablet Take 40 mg by mouth daily.    Marland Kitchen topiramate (TOPAMAX) 100 MG tablet Take 1 tablet (100 mg total) by mouth 2 (two) times daily. 180 tablet 4  . AIMOVIG 70 MG/ML SOAJ INJECT 70 MG INTO THE SKIN EVERY 30 DAYS 3 pen 0  . Erenumab-aooe (AIMOVIG) 140 MG/ML SOAJ Inject 140 mg into the skin every 30 (thirty) days. 3 pen 4   No facility-administered medications prior to visit.     PAST MEDICAL HISTORY: Past Medical History:  Diagnosis Date  . Anemia   . Asthma   . Back pain    02/2019 getting injections  . Carpal tunnel syndrome   . Diabetes mellitus    type 2   . Fibroid tumor   . Ganglion cyst   . GERD (gastroesophageal reflux disease)    will see Dr. Tammi Klippel 04/2018  . Headache(784.0)   . Heart murmur    saw dr. Einar Gip (only sees if needed, not seen since 2011)  .  Hypertension    dr Einar Gip     . Hypothyroidism   . Shortness of breath    on exertion   . Shoulder pain   . Tendonitis    left shoulder biceps tendonitis, acromioclavicular osteoarthritis  . Tennis elbow   . Thyroid disease      PAST SURGICAL HISTORY: Past Surgical History:  Procedure Laterality Date  . ABDOMINAL HYSTERECTOMY    . ANTERIOR LAT LUMBAR FUSION N/A 01/03/2013   Procedure: X LIF (Extreme Lateral Interbody Fusion) L3-L4,  (1 LEVEL) ;  Surgeon: Melina Schools, MD;  Location: [facility];  Service: Orthopedics;  Laterality: N/A;  . BACK SURGERY     x5  . CARDIAC CATHETERIZATION    . CHOLECYSTECTOMY    . COLONOSCOPY  04/18/2017   Dr Juanita Craver ; [place]   . EYE SURGERY  05/23/2017   laser of right eye, to have left eye done on 06-13-17 - for cataracts   . KNEE ARTHROSCOPY Right 04/2014  . LAPAROSCOPY N/A 05/30/2017   Procedure: LAPAROSCOPY DIAGNOSTIC;  Surgeon: Greer Pickerel, MD;  Location: [facility];  Service: General;  Laterality: N/A;  . LEFT HEART CATHETERIZATION WITH CORONARY ANGIOGRAM N/A 04/27/2011    Procedure: LEFT HEART CATHETERIZATION WITH CORONARY ANGIOGRAM;  Surgeon: Laverda Page, MD;  Location[facility];  Service: Cardiovascular;  Laterality: N/A;  . pelvic bone removed    . ROTATOR CUFF REPAIR Left    x3 surgeries; Dr Nicki Reaper [facility]   . SHOULDER ARTHROSCOPY WITH SUBACROMIAL DECOMPRESSION, ROTATOR CUFF REPAIR AND BICEP TENDON REPAIR Left 02/03/2016   Procedure: SHOULDER ARTHROSCOPY WITH SUBACROMIAL DECOMPRESSION, DEBRIDEMENT, BICEPS TENODESIS, DISTAL CLAVICLE EXCISION.;  Surgeon: Meredith Pel, MD;  Location: [facility];  Service: Orthopedics;  Laterality: Left;  . THYROID SURGERY     LUMP REMOVED  . TUBAL LIGATION       FAMILY HISTORY: Family History  Problem Relation Age of Onset  . Heart disease Mother   . Heart disease Father   . Breast cancer Sister   . Heart disease Sister   . Heart disease Brother   . Heart disease Paternal Uncle      SOCIAL HISTORY: Social History   Socioeconomic History  . Marital status: Divorced    Spouse name: Not on file  . Number of children: Not on file  . Years of education: Not on file  . Highest education level: Not on file  Occupational History  . Occupation: disability  Tobacco Use  . Smoking status: Never Smoker  . Smokeless tobacco: Never Used  Vaping Use  . Vaping Use: Never used  Substance and Sexual Activity  . Alcohol use: No  . Drug use: No  . Sexual activity: Yes  Other Topics Concern  . Not on file  Social History Narrative  . Not on file   Social Determinants of Health   Financial Resource Strain: Not on file  Food Insecurity: Not on file  Transportation Needs: Not on file  Physical Activity: Not on file  Stress: Not on file  Social Connections: Not on file  Intimate Partner Violence: Not on file      PHYSICAL EXAM  Vitals:   04/29/20 1450  BP: (!) 154/99  Pulse: 86  Weight: 148 lb (67.1 kg)  Height: 4\' 8"  (1.422 m)   Body mass index is 33.18  kg/m.   Generalized: Well developed, in no acute distress  Cardiology: normal rate and rhythm, no murmur auscultated  Respiratory: clear  to auscultation bilaterally    Neurological examination  Mentation: Alert oriented to time, place, history taking. Follows all commands speech and language fluent Cranial nerve II-XII: Pupils were equal round reactive to light. Extraocular movements were full, visual field were full on confrontational test. Facial sensation and strength were normal. Head turning and shoulder shrug  were normal and symmetric. Motor: The motor testing reveals 5 over 5 strength of all 4 extremities. Good symmetric motor tone is noted throughout.  Gait and station: Gait is normal.     DIAGNOSTIC DATA (LABS, IMAGING, TESTING) - I reviewed patient records, labs, notes, testing and imaging myself where available.  Lab Results  Component Value Date   WBC 7.9 03/29/2018   HGB 10.9 (L) 03/29/2018   HCT 35.7 (L) 03/29/2018   MCV 91.1 03/29/2018   PLT 281 03/29/2018      Component Value Date/Time   NA 144 07/20/2018 1433   K 4.0 07/20/2018 1433   CL 108 (H) 07/20/2018 1433   CO2 21 07/20/2018 1433   GLUCOSE 75 07/20/2018 1433   GLUCOSE 66 (L) 03/29/2018 1038   BUN 18 07/20/2018 1433   CREATININE 1.30 (H) 07/20/2018 1433   CALCIUM 10.2 07/20/2018 1433   PROT 7.3 07/20/2018 1433   ALBUMIN 4.8 07/20/2018 1433   AST 21 07/20/2018 1433   ALT 19 07/20/2018 1433   ALKPHOS 89 07/20/2018 1433   BILITOT 0.2 07/20/2018 1433   GFRNONAA 44 (L) 07/20/2018 1433   GFRAA 50 (L) 07/20/2018 1433   Lab Results  Component Value Date   CHOL 178 03/08/2018   HDL 86 03/08/2018   LDLCALC 81 03/08/2018   TRIG 55 03/08/2018   CHOLHDL 2.1 03/08/2018   Lab Results  Component Value Date   HGBA1C 9.3 (H) 07/20/2018   No results found for: [id number] Lab Results  Component Value Date   TSH 0.726 07/20/2018    No flowsheet data found.   No flowsheet data  found.   ASSESSMENT AND PLAN  65 y.o. year old female  has a past medical history of Anemia, Asthma, Back pain, Carpal tunnel syndrome, Diabetes mellitus, Fibroid tumor, Ganglion cyst, GERD (gastroesophageal reflux disease), Headache(784.0), Heart murmur, Hypertension, Hypothyroidism, Shortness of breath, Shoulder pain, Tendonitis, Tennis elbow, and Thyroid disease. here with   Intractable migraine without aura and without status migrainosus  Nainika continues to have daily headaches. Most are generalized and sharp without migrainous features but she does endorse intermittent nausea, photo and phonophobia. Unclear how often she has migraine symptoms. We will switch Amovig to Ajovy every 30 days. She will continue topiramate 100mg  twice daily and Tylenol for abortive therapy. She was advised to monitor daily dosing of Tylenol as she continues Percocet QID. I suspect multiple factors contribute to intractable headaches. We have discussed common migraine triggers, rebound headaches and side effects of untreated sleep apnea. She does not wish to pursue sleep study. She feels pain regimen is working well to help control back pain. She does not think she can tolerate Botox therapy. She will continue close follow up with PCP and specialists as directed. Healthy lifestyle habits encouraged. She will follow up in 1 year if doing well, sooner if needed. She verbalizes understanding and agreement with this plan.    No orders of the defined types were placed in this encounter.    Meds ordered this encounter  Medications  . Fremanezumab-vfrm (AJOVY) 225 MG/1.5ML SOAJ    Sig: Inject 225 mg into the skin every 30 (thirty) days.  Dispense:  4.5 mL    Refill:  3    Order Specific Question:   Supervising Provider    Answer:   Melvenia Beam [id number]      I spent 20 minutes of face-to-face and non-face-to-face time with patient.  This included previsit chart review, lab review, study review, order  entry, electronic health record documentation, patient education.    Debbora Presto, MSN, FNP-C 04/29/2020, 3:30 PM  [facility] [address] [place], [place] [address] [phone]

## 2020-04-30 ENCOUNTER — Telehealth: Payer: Self-pay | Admitting: Family Medicine

## 2020-04-30 ENCOUNTER — Telehealth: Payer: Self-pay | Admitting: *Deleted

## 2020-04-30 NOTE — Telephone Encounter (Signed)
Emgality PA, key: [id number], [id number]. Your information has been sent to [facility].

## 2020-04-30 NOTE — Telephone Encounter (Signed)
Ajovy denied, (1) You need to try this covered drug: Emgality. NP has ordered Emgality.

## 2020-04-30 NOTE — Telephone Encounter (Signed)
Emgality approved through 02/14/21. Faxed approval letter to [facility], called patient.

## 2020-04-30 NOTE — Telephone Encounter (Signed)
Pt called, insurance will not cover Fremanezumab-vfrm (AJOVY) 225 MG/1.5ML SOAJ. Pharmacy waiting on a new prescription. Would like a call from the nurse.

## 2020-04-30 NOTE — Telephone Encounter (Signed)
Ajovy PA, key:  [id number], [id number], failed Topiramate, Aimovig.  Your information has been sent to [facility]. Called patient and advised her PA is in process.

## 2020-05-05 DIAGNOSIS — J45998 Other asthma: Secondary | ICD-10-CM | POA: Diagnosis not present

## 2020-05-13 DIAGNOSIS — H26491 Other secondary cataract, right eye: Secondary | ICD-10-CM | POA: Diagnosis not present

## 2020-05-14 ENCOUNTER — Ambulatory Visit: Payer: Self-pay | Admitting: Nurse Practitioner

## 2020-05-20 ENCOUNTER — Other Ambulatory Visit: Payer: Self-pay

## 2020-05-20 ENCOUNTER — Telehealth: Payer: Self-pay

## 2020-05-20 ENCOUNTER — Ambulatory Visit: Payer: Self-pay | Admitting: Nurse Practitioner

## 2020-05-20 DIAGNOSIS — E1165 Type 2 diabetes mellitus with hyperglycemia: Secondary | ICD-10-CM

## 2020-05-20 DIAGNOSIS — E039 Hypothyroidism, unspecified: Secondary | ICD-10-CM

## 2020-05-20 MED ORDER — BECLOMETHASONE DIPROPIONATE 80 MCG/ACT IN AERS: 2.0000 | INHALATION_SPRAY | Freq: Once | RESPIRATORY_TRACT | 0 refills | Status: DC

## 2020-05-20 MED ORDER — HUMULIN N KWIKPEN 100 UNIT/ML ~~LOC~~ SUPN: 45.0000 [IU] | PEN_INJECTOR | SUBCUTANEOUS | 0 refills | Status: DC

## 2020-05-20 MED ORDER — ALBUTEROL SULFATE HFA 108 (90 BASE) MCG/ACT IN AERS: 2.0000 | INHALATION_SPRAY | Freq: Four times a day (QID) | RESPIRATORY_TRACT | 0 refills | Status: DC | PRN

## 2020-05-20 MED ORDER — HYDROCORTISONE 0.5 % EX CREA: 1.0000 "application " | TOPICAL_CREAM | Freq: Two times a day (BID) | CUTANEOUS | 0 refills | Status: DC

## 2020-05-20 MED ORDER — DEXLANSOPRAZOLE 60 MG PO CPDR: 1.0000 | DELAYED_RELEASE_CAPSULE | Freq: Every day | ORAL | 0 refills | Status: DC

## 2020-05-20 MED ORDER — FLUTICASONE PROPIONATE HFA 44 MCG/ACT IN AERO: 1.0000 | INHALATION_SPRAY | Freq: Two times a day (BID) | RESPIRATORY_TRACT | 2 refills | Status: DC

## 2020-05-20 MED ORDER — HUMALOG KWIKPEN 100 UNIT/ML ~~LOC~~ SOPN: 5.0000 [IU] | PEN_INJECTOR | Freq: Three times a day (TID) | SUBCUTANEOUS | 0 refills | Status: DC

## 2020-05-20 MED ORDER — DESLORATADINE 5 MG PO TABS: 5.0000 mg | ORAL_TABLET | Freq: Every day | ORAL | 0 refills | Status: AC

## 2020-05-20 MED ORDER — LEVOTHYROXINE SODIUM 75 MCG PO TABS: 75.0000 ug | ORAL_TABLET | Freq: Every day | ORAL | 0 refills | Status: AC

## 2020-05-20 MED ORDER — ALBUTEROL SULFATE (2.5 MG/3ML) 0.083% IN NEBU: 2.5000 mg | INHALATION_SOLUTION | Freq: Four times a day (QID) | RESPIRATORY_TRACT | 0 refills | Status: DC | PRN

## 2020-05-20 MED ORDER — ROSUVASTATIN CALCIUM 40 MG PO TABS: 40.0000 mg | ORAL_TABLET | Freq: Every day | ORAL | 0 refills | Status: DC

## 2020-05-20 NOTE — Telephone Encounter (Signed)
Per JM: She missed her appt and she has cancelled 5 times since last June we can see her for emergencies for 30 days and provide her with refills of her medication for 90 until she finds another provider Pt is aware, she asked for a letter and a refill on all her medications for 90 days

## 2020-05-20 NOTE — Telephone Encounter (Signed)
Noted  

## 2020-06-02 ENCOUNTER — Ambulatory Visit: Payer: Self-pay | Admitting: Nurse Practitioner

## 2020-06-03 DIAGNOSIS — Z13228 Encounter for screening for other metabolic disorders: Secondary | ICD-10-CM | POA: Diagnosis not present

## 2020-06-03 DIAGNOSIS — E1165 Type 2 diabetes mellitus with hyperglycemia: Secondary | ICD-10-CM | POA: Diagnosis not present

## 2020-06-10 DIAGNOSIS — J45998 Other asthma: Secondary | ICD-10-CM | POA: Diagnosis not present

## 2020-06-10 DIAGNOSIS — M542 Cervicalgia: Secondary | ICD-10-CM | POA: Diagnosis not present

## 2020-06-10 DIAGNOSIS — M5416 Radiculopathy, lumbar region: Secondary | ICD-10-CM | POA: Diagnosis not present

## 2020-06-18 DIAGNOSIS — E1165 Type 2 diabetes mellitus with hyperglycemia: Secondary | ICD-10-CM | POA: Diagnosis not present

## 2020-06-18 DIAGNOSIS — Z0000 Encounter for general adult medical examination without abnormal findings: Secondary | ICD-10-CM | POA: Diagnosis not present

## 2020-06-18 DIAGNOSIS — Z23 Encounter for immunization: Secondary | ICD-10-CM | POA: Diagnosis not present

## 2020-06-19 ENCOUNTER — Other Ambulatory Visit: Payer: Self-pay | Admitting: Internal Medicine

## 2020-06-19 DIAGNOSIS — R5381 Other malaise: Secondary | ICD-10-CM

## 2020-06-19 DIAGNOSIS — Z1231 Encounter for screening mammogram for malignant neoplasm of breast: Secondary | ICD-10-CM

## 2020-07-03 DIAGNOSIS — J45998 Other asthma: Secondary | ICD-10-CM | POA: Diagnosis not present

## 2020-07-08 ENCOUNTER — Ambulatory Visit: Payer: Medicare Other | Admitting: Obstetrics

## 2020-07-10 DIAGNOSIS — E785 Hyperlipidemia, unspecified: Secondary | ICD-10-CM | POA: Diagnosis not present

## 2020-07-10 DIAGNOSIS — E039 Hypothyroidism, unspecified: Secondary | ICD-10-CM | POA: Diagnosis not present

## 2020-07-10 DIAGNOSIS — E119 Type 2 diabetes mellitus without complications: Secondary | ICD-10-CM | POA: Diagnosis not present

## 2020-07-10 DIAGNOSIS — J45909 Unspecified asthma, uncomplicated: Secondary | ICD-10-CM | POA: Diagnosis not present

## 2020-07-10 DIAGNOSIS — I10 Essential (primary) hypertension: Secondary | ICD-10-CM | POA: Diagnosis not present

## 2020-07-10 DIAGNOSIS — G8929 Other chronic pain: Secondary | ICD-10-CM | POA: Diagnosis not present

## 2020-07-18 ENCOUNTER — Encounter: Payer: Self-pay | Admitting: Gastroenterology

## 2020-07-18 DIAGNOSIS — I10 Essential (primary) hypertension: Secondary | ICD-10-CM | POA: Diagnosis not present

## 2020-07-18 DIAGNOSIS — K219 Gastro-esophageal reflux disease without esophagitis: Secondary | ICD-10-CM | POA: Diagnosis not present

## 2020-07-18 DIAGNOSIS — E1165 Type 2 diabetes mellitus with hyperglycemia: Secondary | ICD-10-CM | POA: Diagnosis not present

## 2020-07-22 DIAGNOSIS — M5416 Radiculopathy, lumbar region: Secondary | ICD-10-CM | POA: Diagnosis not present

## 2020-07-28 ENCOUNTER — Ambulatory Visit: Payer: Medicare Other | Admitting: Obstetrics

## 2020-07-28 DIAGNOSIS — J45998 Other asthma: Secondary | ICD-10-CM | POA: Diagnosis not present

## 2020-07-30 ENCOUNTER — Other Ambulatory Visit: Payer: Self-pay | Admitting: Internal Medicine

## 2020-07-30 DIAGNOSIS — Z780 Asymptomatic menopausal state: Secondary | ICD-10-CM

## 2020-08-07 DIAGNOSIS — I10 Essential (primary) hypertension: Secondary | ICD-10-CM | POA: Diagnosis not present

## 2020-08-07 DIAGNOSIS — E039 Hypothyroidism, unspecified: Secondary | ICD-10-CM | POA: Diagnosis not present

## 2020-08-07 DIAGNOSIS — J45909 Unspecified asthma, uncomplicated: Secondary | ICD-10-CM | POA: Diagnosis not present

## 2020-08-07 DIAGNOSIS — E119 Type 2 diabetes mellitus without complications: Secondary | ICD-10-CM | POA: Diagnosis not present

## 2020-08-07 DIAGNOSIS — G8929 Other chronic pain: Secondary | ICD-10-CM | POA: Diagnosis not present

## 2020-08-08 ENCOUNTER — Other Ambulatory Visit: Payer: Self-pay | Admitting: Nurse Practitioner

## 2020-08-13 DIAGNOSIS — E1121 Type 2 diabetes mellitus with diabetic nephropathy: Secondary | ICD-10-CM | POA: Diagnosis not present

## 2020-08-13 DIAGNOSIS — M1990 Unspecified osteoarthritis, unspecified site: Secondary | ICD-10-CM | POA: Diagnosis not present

## 2020-08-13 DIAGNOSIS — E1165 Type 2 diabetes mellitus with hyperglycemia: Secondary | ICD-10-CM | POA: Diagnosis not present

## 2020-08-13 DIAGNOSIS — I10 Essential (primary) hypertension: Secondary | ICD-10-CM | POA: Diagnosis not present

## 2020-09-01 ENCOUNTER — Ambulatory Visit: Payer: Medicare Other | Admitting: Endocrinology

## 2020-09-04 DIAGNOSIS — J45998 Other asthma: Secondary | ICD-10-CM | POA: Diagnosis not present

## 2020-09-08 DIAGNOSIS — G8929 Other chronic pain: Secondary | ICD-10-CM | POA: Diagnosis not present

## 2020-09-08 DIAGNOSIS — I10 Essential (primary) hypertension: Secondary | ICD-10-CM | POA: Diagnosis not present

## 2020-09-08 DIAGNOSIS — E039 Hypothyroidism, unspecified: Secondary | ICD-10-CM | POA: Diagnosis not present

## 2020-09-08 DIAGNOSIS — E785 Hyperlipidemia, unspecified: Secondary | ICD-10-CM | POA: Diagnosis not present

## 2020-09-08 DIAGNOSIS — E119 Type 2 diabetes mellitus without complications: Secondary | ICD-10-CM | POA: Diagnosis not present

## 2020-09-08 DIAGNOSIS — J45909 Unspecified asthma, uncomplicated: Secondary | ICD-10-CM | POA: Diagnosis not present

## 2020-09-19 ENCOUNTER — Other Ambulatory Visit: Payer: Self-pay | Admitting: Nurse Practitioner

## 2020-10-09 DIAGNOSIS — J45998 Other asthma: Secondary | ICD-10-CM | POA: Diagnosis not present

## 2020-10-13 ENCOUNTER — Other Ambulatory Visit: Payer: Self-pay | Admitting: Nurse Practitioner

## 2020-10-15 DIAGNOSIS — G8929 Other chronic pain: Secondary | ICD-10-CM | POA: Diagnosis not present

## 2020-10-15 DIAGNOSIS — I10 Essential (primary) hypertension: Secondary | ICD-10-CM | POA: Diagnosis not present

## 2020-10-15 DIAGNOSIS — E039 Hypothyroidism, unspecified: Secondary | ICD-10-CM | POA: Diagnosis not present

## 2020-10-15 DIAGNOSIS — E785 Hyperlipidemia, unspecified: Secondary | ICD-10-CM | POA: Diagnosis not present

## 2020-10-15 DIAGNOSIS — E119 Type 2 diabetes mellitus without complications: Secondary | ICD-10-CM | POA: Diagnosis not present

## 2020-10-15 DIAGNOSIS — J45909 Unspecified asthma, uncomplicated: Secondary | ICD-10-CM | POA: Diagnosis not present

## 2020-10-16 ENCOUNTER — Encounter: Payer: Medicare Other | Admitting: Gastroenterology

## 2020-11-05 DIAGNOSIS — E785 Hyperlipidemia, unspecified: Secondary | ICD-10-CM | POA: Diagnosis not present

## 2020-11-05 DIAGNOSIS — E162 Hypoglycemia, unspecified: Secondary | ICD-10-CM | POA: Diagnosis not present

## 2020-11-05 DIAGNOSIS — E1165 Type 2 diabetes mellitus with hyperglycemia: Secondary | ICD-10-CM | POA: Diagnosis not present

## 2020-11-05 DIAGNOSIS — E1121 Type 2 diabetes mellitus with diabetic nephropathy: Secondary | ICD-10-CM | POA: Diagnosis not present

## 2020-11-05 DIAGNOSIS — E161 Other hypoglycemia: Secondary | ICD-10-CM | POA: Diagnosis not present

## 2020-11-05 DIAGNOSIS — J302 Other seasonal allergic rhinitis: Secondary | ICD-10-CM | POA: Diagnosis not present

## 2020-11-05 DIAGNOSIS — I10 Essential (primary) hypertension: Secondary | ICD-10-CM | POA: Diagnosis not present

## 2020-11-06 ENCOUNTER — Other Ambulatory Visit: Payer: Self-pay | Admitting: Nurse Practitioner

## 2020-11-13 ENCOUNTER — Encounter: Payer: Self-pay | Admitting: Gastroenterology

## 2020-11-20 DIAGNOSIS — N179 Acute kidney failure, unspecified: Secondary | ICD-10-CM | POA: Diagnosis not present

## 2020-11-20 DIAGNOSIS — E162 Hypoglycemia, unspecified: Secondary | ICD-10-CM | POA: Diagnosis not present

## 2020-11-20 DIAGNOSIS — I10 Essential (primary) hypertension: Secondary | ICD-10-CM | POA: Diagnosis not present

## 2020-11-20 DIAGNOSIS — E1165 Type 2 diabetes mellitus with hyperglycemia: Secondary | ICD-10-CM | POA: Diagnosis not present

## 2020-11-23 DIAGNOSIS — J45909 Unspecified asthma, uncomplicated: Secondary | ICD-10-CM | POA: Diagnosis not present

## 2020-11-23 DIAGNOSIS — E785 Hyperlipidemia, unspecified: Secondary | ICD-10-CM | POA: Diagnosis not present

## 2020-11-23 DIAGNOSIS — G894 Chronic pain syndrome: Secondary | ICD-10-CM | POA: Diagnosis not present

## 2020-11-23 DIAGNOSIS — E119 Type 2 diabetes mellitus without complications: Secondary | ICD-10-CM | POA: Diagnosis not present

## 2020-11-23 DIAGNOSIS — E039 Hypothyroidism, unspecified: Secondary | ICD-10-CM | POA: Diagnosis not present

## 2020-11-23 DIAGNOSIS — I10 Essential (primary) hypertension: Secondary | ICD-10-CM | POA: Diagnosis not present

## 2020-12-01 DIAGNOSIS — Z79899 Other long term (current) drug therapy: Secondary | ICD-10-CM | POA: Diagnosis not present

## 2020-12-01 DIAGNOSIS — M5416 Radiculopathy, lumbar region: Secondary | ICD-10-CM | POA: Diagnosis not present

## 2020-12-01 DIAGNOSIS — Z5181 Encounter for therapeutic drug level monitoring: Secondary | ICD-10-CM | POA: Diagnosis not present

## 2020-12-31 DIAGNOSIS — E039 Hypothyroidism, unspecified: Secondary | ICD-10-CM | POA: Diagnosis not present

## 2020-12-31 DIAGNOSIS — J45909 Unspecified asthma, uncomplicated: Secondary | ICD-10-CM | POA: Diagnosis not present

## 2020-12-31 DIAGNOSIS — E119 Type 2 diabetes mellitus without complications: Secondary | ICD-10-CM | POA: Diagnosis not present

## 2020-12-31 DIAGNOSIS — G894 Chronic pain syndrome: Secondary | ICD-10-CM | POA: Diagnosis not present

## 2020-12-31 DIAGNOSIS — I10 Essential (primary) hypertension: Secondary | ICD-10-CM | POA: Diagnosis not present

## 2020-12-31 DIAGNOSIS — E785 Hyperlipidemia, unspecified: Secondary | ICD-10-CM | POA: Diagnosis not present

## 2021-01-19 ENCOUNTER — Other Ambulatory Visit: Payer: Self-pay | Admitting: Nurse Practitioner

## 2021-01-29 DIAGNOSIS — E785 Hyperlipidemia, unspecified: Secondary | ICD-10-CM | POA: Diagnosis not present

## 2021-01-29 DIAGNOSIS — G894 Chronic pain syndrome: Secondary | ICD-10-CM | POA: Diagnosis not present

## 2021-01-29 DIAGNOSIS — I10 Essential (primary) hypertension: Secondary | ICD-10-CM | POA: Diagnosis not present

## 2021-01-29 DIAGNOSIS — J45909 Unspecified asthma, uncomplicated: Secondary | ICD-10-CM | POA: Diagnosis not present

## 2021-01-29 DIAGNOSIS — E119 Type 2 diabetes mellitus without complications: Secondary | ICD-10-CM | POA: Diagnosis not present

## 2021-02-10 ENCOUNTER — Telehealth: Payer: Self-pay

## 2021-02-10 NOTE — Telephone Encounter (Signed)
Submitted PA on CMM.  Key: [id number].  Waiting on determination from [facility].

## 2021-02-10 NOTE — Telephone Encounter (Signed)
Request Reference Number: [id number]. EMGALITY INJ 120MG /ML is approved through 02/14/2022. Your patient may now fill this prescription and it will be covered.

## 2021-03-16 DIAGNOSIS — Z961 Presence of intraocular lens: Secondary | ICD-10-CM | POA: Diagnosis not present

## 2021-03-16 DIAGNOSIS — E119 Type 2 diabetes mellitus without complications: Secondary | ICD-10-CM | POA: Diagnosis not present

## 2021-03-16 DIAGNOSIS — H04123 Dry eye syndrome of bilateral lacrimal glands: Secondary | ICD-10-CM | POA: Diagnosis not present

## 2021-03-16 DIAGNOSIS — I10 Essential (primary) hypertension: Secondary | ICD-10-CM | POA: Diagnosis not present

## 2021-03-17 ENCOUNTER — Other Ambulatory Visit: Payer: Self-pay | Admitting: Nurse Practitioner

## 2021-03-17 DIAGNOSIS — M5416 Radiculopathy, lumbar region: Secondary | ICD-10-CM | POA: Diagnosis not present

## 2021-04-08 ENCOUNTER — Other Ambulatory Visit: Payer: Self-pay

## 2021-04-08 ENCOUNTER — Ambulatory Visit: Payer: Self-pay

## 2021-04-08 ENCOUNTER — Ambulatory Visit (INDEPENDENT_AMBULATORY_CARE_PROVIDER_SITE_OTHER): Payer: Medicare Other | Admitting: Orthopedic Surgery

## 2021-04-08 DIAGNOSIS — M25511 Pain in right shoulder: Secondary | ICD-10-CM | POA: Diagnosis not present

## 2021-04-08 DIAGNOSIS — M25512 Pain in left shoulder: Secondary | ICD-10-CM

## 2021-04-09 ENCOUNTER — Other Ambulatory Visit: Payer: Self-pay | Admitting: Nurse Practitioner

## 2021-04-10 ENCOUNTER — Encounter: Payer: Self-pay | Admitting: Orthopedic Surgery

## 2021-04-10 NOTE — Progress Notes (Signed)
Office Visit Note   Patient: WRENLEIGH BRAUNER           Date of Birth: 1955/11/14           MRN: [id number] Visit Date: 04/08/2021 Requested by: Arnette Felts, FNP [address] [place],  [place] [address] PCP: Arnette Felts, FNP  Subjective: Chief Complaint  Patient presents with   Left Shoulder - Pain   Right Shoulder - Pain    HPI: Patient presents for evaluation of bilateral shoulder pain right worse than left.  Patient is right-hand dominant.  Reports relatively constant pain.  She was getting injections in both shoulders by Dr. Horald Chestnut without much relief.  She does report decreased range of motion.  Has a history of arthroscopic surgery on the right shoulder as well as rotator cuff tear surgery on the left shoulder.  She is in pain management taking Opana and oxycodone on a daily basis.              ROS: All systems reviewed are negative as they relate to the chief complaint within the history of present illness.  Patient denies  fevers or chills.   Assessment & Plan: Visit Diagnoses:  1. Bilateral shoulder pain, unspecified chronicity     Plan: Impression is bilateral shoulder pain with radiographs which show surprising absence of significant acromiohumeral narrowing and arthritis.  Hard to say if there is an actionable structural problem in the more symptomatic right shoulder.  MRI arthrogram requested for over a year of symptoms with failure of conservative management including injections as well as therapeutic exercises under the guidance of a physician in another group.  Follow-up after that study  Follow-Up Instructions: Return for after MRI.   Orders:  Orders Placed This Encounter  Procedures   XR Shoulder Right   XR Shoulder Left   MR SHOULDER RIGHT W CONTRAST   Arthrogram   No orders of the defined types were placed in this encounter.     Procedures: No procedures performed   Clinical Data: No additional findings.  Objective: Vital Signs:  There were no vitals taken for this visit.  Physical Exam:   Constitutional: Patient appears well-developed HEENT:  Head: Normocephalic Eyes:EOM are normal Neck: Normal range of motion Cardiovascular: Normal rate Pulmonary/chest: Effort normal Neurologic: Patient is alert Skin: Skin is warm Psychiatric: Patient has normal mood and affect   Ortho Exam: Ortho exam demonstrates good rotator cuff strength bilaterally to infraspinatus supraspinatus and subscap muscle testing.  Mild tenderness diffusely throughout both shoulders to palpation.  Slightly more crepitus on the left compared to the right with internal and external rotation of the arm at 90 degrees of abduction.  Passive range of motion on the right is 45/85/160.  Deltoid is functional.  Cervical spine range of motion intact with no paresthesias at this time below the elbow level.  Specialty Comments:  No specialty comments available.  Imaging: No results found.   PMFS History: Patient Active Problem List   Diagnosis Date Noted   Essential hypertension 03/09/2018   Other insomnia 03/09/2018   follows with endocrinology 03/09/2018   Hypothyroidism 03/09/2018   Controlled type 2 diabetes mellitus with hyperglycemia (HCC) 12/21/2017   Abdominal pain 12/21/2017   Shoulder arthritis 02/03/2016   Past Medical History:  Diagnosis Date   Anemia    Asthma    Back pain    02/2019 getting injections   Carpal tunnel syndrome    Diabetes mellitus    type 2  Fibroid tumor    Ganglion cyst    GERD (gastroesophageal reflux disease)    will see Dr. Kathrynn Running 04/2018   Headache(784.0)    Heart murmur    saw dr. Jacinto Halim (only sees if needed, not seen since 2011)   Hypertension    dr Jacinto Halim      Hypothyroidism    Shortness of breath    on exertion    Shoulder pain    Tendonitis    left shoulder biceps tendonitis, acromioclavicular osteoarthritis   Tennis elbow    Thyroid disease     Family History  Problem Relation Age of  Onset   Heart disease Mother    Heart disease Father    Breast cancer Sister    Heart disease Sister    Heart disease Brother    Heart disease Paternal Uncle     Past Surgical History:  Procedure Laterality Date   ABDOMINAL HYSTERECTOMY     ANTERIOR LAT LUMBAR FUSION N/A 01/03/2013   Procedure: X LIF (Extreme Lateral Interbody Fusion) L3-L4,  (1 LEVEL) ;  Surgeon: Venita Lick, MD;  Location: [facility];  Service: Orthopedics;  Laterality: N/A;   BACK SURGERY     x5   CARDIAC CATHETERIZATION     CHOLECYSTECTOMY     COLONOSCOPY  04/18/2017   Dr Charna Elizabeth ; [facility]    EYE SURGERY  05/23/2017   laser of right eye, to have left eye done on 06-13-17 - for cataracts    KNEE ARTHROSCOPY Right 04/2014   LAPAROSCOPY N/A 05/30/2017   Procedure: LAPAROSCOPY DIAGNOSTIC;  Surgeon: Gaynelle Adu, MD;  Location: [facility];  Service: General;  Laterality: N/A;   LEFT HEART CATHETERIZATION WITH CORONARY ANGIOGRAM N/A 04/27/2011   Procedure: LEFT HEART CATHETERIZATION WITH CORONARY ANGIOGRAM;  Surgeon: Pamella Pert, MD;  Location: [facility] CATH LAB;  Service: Cardiovascular;  Laterality: N/A;   pelvic bone removed     ROTATOR CUFF REPAIR Left    x3 surgeries; Dr Lorin Picket Camree [place].[facility]    SHOULDER ARTHROSCOPY WITH SUBACROMIAL DECOMPRESSION, ROTATOR CUFF REPAIR AND BICEP TENDON REPAIR Left 02/03/2016   Procedure: SHOULDER ARTHROSCOPY WITH SUBACROMIAL DECOMPRESSION, DEBRIDEMENT, BICEPS TENODESIS, DISTAL CLAVICLE EXCISION.;  Surgeon: Cammy Copa, MD;  Location: [facility];  Service: Orthopedics;  Laterality: Left;   THYROID SURGERY     LUMP REMOVED   TUBAL LIGATION     Social History   Occupational History   Occupation: disability  Tobacco Use   Smoking status: Never   Smokeless tobacco: Never  Vaping Use   Vaping Use: Never used  Substance and Sexual Activity   Alcohol use: No   Drug use: No   Sexual activity: Yes

## 2021-04-18 ENCOUNTER — Other Ambulatory Visit: Payer: Self-pay | Admitting: Nurse Practitioner

## 2021-04-21 ENCOUNTER — Other Ambulatory Visit: Payer: Self-pay

## 2021-04-21 MED ORDER — TOPIRAMATE 100 MG PO TABS: 100.0000 mg | ORAL_TABLET | Freq: Two times a day (BID) | ORAL | 1 refills | Status: DC

## 2021-04-23 ENCOUNTER — Ambulatory Visit
Admission: RE | Admit: 2021-04-23 | Discharge: 2021-04-23 | Disposition: A | Discharge status: Home / Self Care | Payer: Medicare Other | Source: Ambulatory Visit | Attending: Orthopedic Surgery | Admitting: Orthopedic Surgery

## 2021-04-23 ENCOUNTER — Other Ambulatory Visit: Payer: Self-pay

## 2021-04-23 DIAGNOSIS — S46011A Strain of muscle(s) and tendon(s) of the rotator cuff of right shoulder, initial encounter: Secondary | ICD-10-CM | POA: Diagnosis not present

## 2021-04-23 DIAGNOSIS — M25511 Pain in right shoulder: Secondary | ICD-10-CM

## 2021-04-23 DIAGNOSIS — M25611 Stiffness of right shoulder, not elsewhere classified: Secondary | ICD-10-CM | POA: Diagnosis not present

## 2021-04-23 MED ORDER — IOPAMIDOL (ISOVUE-M 200) INJECTION 41%
15.0000 mL | Freq: Once | INTRAMUSCULAR | Status: AC
  Administered 2021-04-23: 15 mL via INTRA_ARTICULAR

## 2021-04-29 ENCOUNTER — Ambulatory Visit: Payer: Medicare Other | Admitting: Family Medicine

## 2021-05-06 ENCOUNTER — Other Ambulatory Visit: Payer: Self-pay

## 2021-05-06 ENCOUNTER — Ambulatory Visit (INDEPENDENT_AMBULATORY_CARE_PROVIDER_SITE_OTHER): Payer: Medicare Other | Admitting: Orthopedic Surgery

## 2021-05-06 ENCOUNTER — Telehealth: Payer: Self-pay | Admitting: Orthopedic Surgery

## 2021-05-06 DIAGNOSIS — M75101 Unspecified rotator cuff tear or rupture of right shoulder, not specified as traumatic: Secondary | ICD-10-CM | POA: Diagnosis not present

## 2021-05-06 NOTE — Telephone Encounter (Signed)
IC advised doe ROV needed until after surgery  ?

## 2021-05-06 NOTE — Telephone Encounter (Signed)
Pt was not for sure if she needed to come back , and there was nothing on the AVS, or notes ? ? ? ?Please advise ?

## 2021-05-09 ENCOUNTER — Encounter: Payer: Self-pay | Admitting: Orthopedic Surgery

## 2021-05-09 NOTE — Progress Notes (Signed)
? ?Office Visit Note ?  ?Patient: Brianna Mitchell           ?Date of Birth: 1955-05-10           ?MRN: [id number] ?Visit Date: 05/06/2021 ?Requested by: No referring provider defined for this encounter. ?PCP: Penelope Galas, MD ? ?Subjective: ?Chief Complaint  ?Patient presents with  ? Other  ?   ?Scan review  ? ? ?HPI: Patient presents for follow-up of right shoulder pain.  Since she was last seen she has had an MRI scan of the right shoulder which does show some motion artifact.  Patient appears to have a very high-grade tear of the rotator cuff involving the supraspinatus tendon.  There is no rotator cuff atrophy.  Mild biceps tendinosis is present.  In general compared to the scan from 2020 there has been interval worsening of the high-grade partial-thickness tear of the anterior supraspinatus.  There is also moderate fluid within the subacromial subdeltoid bursa. ? ?Patient has a history of back pain and complicating her surgical decision making today is the fact that she takes oxycodone in the form of Percocet 10 every 6 hours as well as Opana every 12 hours 30 mg.  MRI of the cervical spine from 2018 does show moderate foraminal stenosis on the right at C3-4.  Pain wakes her from sleep at night.  Localizes most of her pain to the shoulder region with occasional radiation below the elbow.  Underwent acromioplasty and distal clavicle excision in 2018.  Surgery was planned for the right shoulder in 2020 but COVID occurred and that surgery was canceled. ?             ?ROS: All systems reviewed are negative as they relate to the chief complaint within the history of present illness.  Patient denies  fevers or chills. ? ? ?Assessment & Plan: ?Visit Diagnoses:  ?1. Tear of right rotator cuff, unspecified tear extent, unspecified whether traumatic   ? ? ?Plan: Impression is right shoulder rotator cuff pathology and biceps tendon pathology.  I will think much of her pain is coming from her resected AC joint.  Bursitis is  present.  She has failed conservative measures including multiple injections as well as rehabilitation program.  Patient is highly motivated to have the shoulder fixed.  However, pain management in this patient will be exceedingly difficult if not impossible.  I explained this to her at length that patient's on this level pain medicine often have significant difficulty with pain control after something is painful with shoulder rotator cuff surgery.  Nonetheless patient would like to proceed with intervention because of the amount of shoulder pain she is having.  She had similar surgery on the left-hand side and has done well with that and currently has no pain in the left shoulder.  Plan to use shoulder brace for passive range of motion after surgery.  All questions answered. ? ?Follow-Up Instructions: No follow-ups on file.  ? ?Orders:  ?No orders of the defined types were placed in this encounter. ? ?No orders of the defined types were placed in this encounter. ? ? ? ? Procedures: ?No procedures performed ? ? ?Clinical Data: ?No additional findings. ? ?Objective: ?Vital Signs: There were no vitals taken for this visit. ? ?Physical Exam:  ? ?Constitutional: Patient appears well-developed ?HEENT:  ?Head: Normocephalic ?Eyes:EOM are normal ?Neck: Normal range of motion ?Cardiovascular: Normal rate ?Pulmonary/chest: Effort normal ?Neurologic: Patient is alert ?Skin: Skin is warm ?Psychiatric: Patient has  normal mood and affect ? ? ?Ortho Exam: Ortho exam demonstrates minimal tenderness over the [facility] joint on the right.  Range of motion of the shoulder is 45/85/150.  Some coarseness is present with internal and external rotation of the shoulder at 90 degrees of abduction.  No real pain with crossarm adduction on the right.  Slight weakness to supraspinatus testing on the right versus left. ? ?Specialty Comments:  ?No specialty comments available. ? ?Imaging: ?No results found. ? ? ?PMFS History: ?Patient Active Problem  List  ? Diagnosis Date Noted  ? Essential hypertension 03/09/2018  ? Other insomnia 03/09/2018  ? follows with endocrinology 03/09/2018  ? Hypothyroidism 03/09/2018  ? Controlled type 2 diabetes mellitus with hyperglycemia (HCC) 12/21/2017  ? Abdominal pain 12/21/2017  ? Shoulder arthritis 02/03/2016  ? ?Past Medical History:  ?Diagnosis Date  ? Anemia   ? Asthma   ? Back pain   ? 02/2019 getting injections  ? Carpal tunnel syndrome   ? Diabetes mellitus   ? type 2   ? Fibroid tumor   ? Ganglion cyst   ? GERD (gastroesophageal reflux disease)   ? will see Dr. Kathrynn Running 04/2018  ? Headache(784.0)   ? Heart murmur   ? saw dr. Jacinto Halim (only sees if needed, not seen since 2011)  ? Hypertension   ? dr Jacinto Halim     ? Hypothyroidism   ? Shortness of breath   ? on exertion   ? Shoulder pain   ? Tendonitis   ? left shoulder biceps tendonitis, acromioclavicular osteoarthritis  ? Tennis elbow   ? Thyroid disease   ?  ?Family History  ?Problem Relation Age of Onset  ? Heart disease Mother   ? Heart disease Father   ? Breast cancer Sister   ? Heart disease Sister   ? Heart disease Brother   ? Heart disease Paternal Uncle   ?  ?Past Surgical History:  ?Procedure Laterality Date  ? ABDOMINAL HYSTERECTOMY    ? ANTERIOR LAT LUMBAR FUSION N/A 01/03/2013  ? Procedure: X LIF (Extreme Lateral Interbody Fusion) L3-L4,  (1 LEVEL) ;  Surgeon: Venita Lick, MD;  Location: [facility] OR;  Service: Orthopedics;  Laterality: N/A;  ? BACK SURGERY    ? x5  ? CARDIAC CATHETERIZATION    ? CHOLECYSTECTOMY    ? COLONOSCOPY  04/18/2017  ? Dr Charna Elizabeth ; [facility]   ? EYE SURGERY  05/23/2017  ? laser of right eye, to have left eye done on 06-13-17 - for cataracts   ? KNEE ARTHROSCOPY Right 04/2014  ? LAPAROSCOPY N/A 05/30/2017  ? Procedure: LAPAROSCOPY DIAGNOSTIC;  Surgeon: Gaynelle Adu, MD;  Location: [facility] ORS;  Service: General;  Laterality: N/A;  ? LEFT HEART CATHETERIZATION WITH CORONARY ANGIOGRAM N/A 04/27/2011  ? Procedure: LEFT HEART  CATHETERIZATION WITH CORONARY ANGIOGRAM;  Surgeon: Pamella Pert, MD;  Location: [facility] CATH LAB;  Service: Cardiovascular;  Laterality: N/A;  ? pelvic bone removed    ? ROTATOR CUFF REPAIR Left   ? x3 surgeries; Dr Lorin Picket Jaquari Reckner.[facility]   ? SHOULDER ARTHROSCOPY WITH SUBACROMIAL DECOMPRESSION, ROTATOR CUFF REPAIR AND BICEP TENDON REPAIR Left 02/03/2016  ? Procedure: SHOULDER ARTHROSCOPY WITH SUBACROMIAL DECOMPRESSION, DEBRIDEMENT, BICEPS TENODESIS, DISTAL CLAVICLE EXCISION.;  Surgeon: Cammy Copa, MD;  Location: [facility] OR;  Service: Orthopedics;  Laterality: Left;  ? THYROID SURGERY    ? LUMP REMOVED  ? TUBAL LIGATION    ? ?Social History  ? ?Occupational History  ? Occupation:  disability  ?Tobacco Use  ? Smoking status: Never  ? Smokeless tobacco: Never  ?Vaping Use  ? Vaping Use: Never used  ?Substance and Sexual Activity  ? Alcohol use: No  ? Drug use: No  ? Sexual activity: Yes  ? ? ? ? ? ?

## 2021-05-15 ENCOUNTER — Encounter (HOSPITAL_COMMUNITY): Payer: Self-pay

## 2021-05-15 ENCOUNTER — Encounter (HOSPITAL_COMMUNITY)
Admission: RE | Admit: 2021-05-15 | Discharge: 2021-05-15 | Disposition: A | Discharge status: Home / Self Care | Payer: Medicare Other | Source: Ambulatory Visit | Attending: Orthopedic Surgery | Admitting: Orthopedic Surgery

## 2021-05-15 ENCOUNTER — Other Ambulatory Visit: Payer: Self-pay

## 2021-05-15 ENCOUNTER — Encounter (HOSPITAL_COMMUNITY): Payer: Self-pay | Admitting: Physician Assistant

## 2021-05-15 VITALS — BP 161/83 | HR 69 | Temp 97.7°F | Resp 17 | Ht <= 58 in | Wt 154.5 lb

## 2021-05-15 DIAGNOSIS — M75101 Unspecified rotator cuff tear or rupture of right shoulder, not specified as traumatic: Secondary | ICD-10-CM | POA: Diagnosis not present

## 2021-05-15 DIAGNOSIS — E11649 Type 2 diabetes mellitus with hypoglycemia without coma: Secondary | ICD-10-CM | POA: Diagnosis not present

## 2021-05-15 DIAGNOSIS — I10 Essential (primary) hypertension: Secondary | ICD-10-CM | POA: Insufficient documentation

## 2021-05-15 DIAGNOSIS — M7522 Bicipital tendinitis, left shoulder: Secondary | ICD-10-CM | POA: Insufficient documentation

## 2021-05-15 DIAGNOSIS — Z01818 Encounter for other preprocedural examination: Secondary | ICD-10-CM | POA: Insufficient documentation

## 2021-05-15 LAB — CBC
HCT: 34.3 % — ABNORMAL LOW (ref 36.0–46.0)
Hemoglobin: 11 g/dL — ABNORMAL LOW (ref 12.0–15.0)
MCH: 28.9 pg (ref 26.0–34.0)
MCHC: 32.1 g/dL (ref 30.0–36.0)
MCV: 90 fL (ref 80.0–100.0)
Platelets: 272 10*3/uL (ref 150–400)
RBC: 3.81 MIL/uL — ABNORMAL LOW (ref 3.87–5.11)
RDW: 13.2 % (ref 11.5–15.5)
WBC: 12.6 10*3/uL — ABNORMAL HIGH (ref 4.0–10.5)
nRBC: 0 % (ref 0.0–0.2)

## 2021-05-15 LAB — BASIC METABOLIC PANEL
Anion gap: 9 (ref 5–15)
BUN: 29 mg/dL — ABNORMAL HIGH (ref 8–23)
CO2: 21 mmol/L — ABNORMAL LOW (ref 22–32)
Calcium: 9.8 mg/dL (ref 8.9–10.3)
Chloride: 107 mmol/L (ref 98–111)
Creatinine, Ser: 1.2 mg/dL — ABNORMAL HIGH (ref 0.44–1.00)
GFR, Estimated: 50 mL/min — ABNORMAL LOW (ref >60)
Glucose, Bld: 107 mg/dL — ABNORMAL HIGH (ref 70–99)
Potassium: 3.7 mmol/L (ref 3.5–5.1)
Sodium: 137 mmol/L (ref 135–145)

## 2021-05-15 LAB — HEMOGLOBIN A1C
Hgb A1c MFr Bld: 12.6 % — ABNORMAL HIGH (ref 4.8–5.6)
Mean Plasma Glucose: 314.92 mg/dL

## 2021-05-15 LAB — GLUCOSE, CAPILLARY
Glucose-Capillary: 48 mg/dL — ABNORMAL LOW (ref 70–99)
Glucose-Capillary: 106 mg/dL — ABNORMAL HIGH (ref 70–99)
Glucose-Capillary: 69 mg/dL — ABNORMAL LOW (ref 70–99)

## 2021-05-15 NOTE — Progress Notes (Signed)
PCP - Leota Sauers MD ?Cardiologist - none ? ?PPM/ICD - denies ?Device Orders -  ?Rep Notified -  ? ?Chest x-ray - na ?EKG - 05/15/21 ?Stress Test - none ?ECHO - none ?Cardiac Cath - none ? ?Sleep Study - pt reports having sleep study but no results found.  ?CPAP - no ? ?Fasting Blood Sugar - 120-140 ?Checks Blood Sugar  four times a day ? ?Blood Thinner Instructions:na ?Aspirin Instructions:na ? ?ERAS Protcol -clear liquids until 0815 ?PRE-SURGERY Ensure or G2- G2 ? ?COVID TEST- na ? ? ?Anesthesia review: yes-abnormal labs ? ?Patient denies shortness of breath, fever, cough and chest pain at PAT appointment ?Informed Karoline Caldwell PA that patient has real fingernails that are at least three inches long and painted and we were unable to get an O2 sat in PAT. Per Jeneen Rinks who discussed the issue with Robbie,patient does not have to cut nails but must remove polish. Pt was informed of this when her pre op instructions were reviewed. Pt verbalized understanding. ? ?All instructions explained to the patient, with a verbal understanding of the material. Patient agrees to go over the instructions while at home for a better understanding. Patient also instructed to wear a mask when out in public prior to surgery. The opportunity to ask questions was provided. ?  ?

## 2021-05-15 NOTE — Progress Notes (Signed)
Hypoglycemic Event ? ?CBG: 48 ? ?Treatment: 4 oz juice/soda and one tube of glucose gel  ? ?Symptoms: None ? ?Follow-up CBG: [id number] CBG Result:69 ?                           [id number]    CBG Result 106 ?Possible Reasons for Event: Inadequate meal intake ? ?Comments/MD notified:James Burns PA ? ? ? ?Colin Broach ? ? ?

## 2021-05-15 NOTE — Progress Notes (Addendum)
Surgical Instructions ? ? ? Your procedure is scheduled on Tuesday April 4. ? Report to [facility] Main Entrance "A" at 9:15 A.M., then check in with the Admitting office. ? Call this number if you have problems the morning of surgery: ? [phone] ? ? If you have any questions prior to your surgery date call [phone]: Open Monday-Friday 8am-4pm ? ? ? Remember: ? Do not eat after midnight the night before your surgery ? ?You may drink clear liquids until 8:15am the morning of your surgery.   ?Clear liquids allowed are: Water, Non-Citrus Juices (without pulp), Carbonated Beverages, Clear Tea, Black Coffee ONLY (NO MILK, CREAM OR POWDERED CREAMER of any kind), and Gatorade ?Patient Instructions ? ?The day of surgery (if you have diabetes): ?Drink ONE (1) 12 oz G2 given to you in your pre admission testing appointment by *:15 the morning of surgery. Drink in one sitting. Do not sip.  ?This drink was given to you during your hospital  ?pre-op appointment visit.  ?Nothing else to drink after completing the  ?12 oz bottle of G2. ?       If you have questions, please contact your surgeon?s office.  ? ?  ? Take these medicines the morning of surgery with A SIP OF WATER:  ?amLODipine (NORVASC) ?dexlansoprazole (DEXILANT)  ?levothyroxine (SYNTHROID)  ?rosuvastatin (CRESTOR)  ?topiramate (TOPAMAX) ?            You may take these medicines the morning of surgery if needed: ?albuterol (PROVENTIL)nebulizer ?albuterol (VENTOLIN HFA) 108 (90 Base) MCG/ACT inhaler-please bring inhaler with you when you come for surgery.  ?oxyCODONE-acetaminophen (PERCOCET) ? ?As of today, STOP taking any Aspirin (unless otherwise instructed by your surgeon) Aleve, Naproxen, Ibuprofen, Motrin, Advil, Goody's, BC's, all herbal medications, fish oil, and all vitamins. ?WHAT DO I DO ABOUT MY DIABETES MEDICATION? ? ? ?Do not take oral diabetes medicines (pills) the morning of surgery. ?insulin.     ?THE NIGHT BEFORE SURGERY, DO NOT take bedtime  dose of Humulin N insulin_ ? ?THE MORNING OF SURGERY, DO NOT take any Humulin N insulin. ?      THE MORNING OF SURGERY, only take Humalog insulin if your CBG is greater than '220mg'$ /dl. ?If your CBG greater than '220mg'$ /dL, you may take ? of your sliding scale (correction) dose (6-7units) of Humalog insulin. ? ?HOW TO MANAGE YOUR DIABETES ?BEFORE AND AFTER SURGERY ? ?Why is it important to control my blood sugar before and after surgery? ?Improving blood sugar levels before and after surgery helps healing and can limit problems. ?A way of improving blood sugar control is eating a healthy diet by: ? Eating less sugar and carbohydrates ? Increasing activity/exercise ? Talking with your doctor about reaching your blood sugar goals ?High blood sugars (greater than 180 mg/dL) can raise your risk of infections and slow your recovery, so you will need to focus on controlling your diabetes during the weeks before surgery. ?Make sure that the doctor who takes care of your diabetes knows about your planned surgery including the date and location. ? ?How do I manage my blood sugar before surgery? ?Check your blood sugar at least 4 times a day, starting 2 days before surgery, to make sure that the level is not too high or low. ? ?Check your blood sugar the morning of your surgery when you wake up and every 2 hours until you get to the Short Stay unit. ? ?If your blood sugar is less than 70 mg/dL, you will need  to treat for low blood sugar: ?Do not take insulin. ?Treat a low blood sugar (less than 70 mg/dL) with ? cup of clear juice (cranberry or apple), 4 glucose tablets, OR glucose gel. ?Recheck blood sugar in 15 minutes after treatment (to make sure it is greater than 70 mg/dL). If your blood sugar is not greater than 70 mg/dL on recheck, call [phone] for further instructions. ?Report your blood sugar to the short stay nurse when you get to Short Stay. ? ?If you are admitted to the hospital after surgery: ?Your blood sugar  will be checked by the staff and you will probably be given insulin after surgery (instead of oral diabetes medicines) to make sure you have good blood sugar levels. ?The goal for blood sugar control after surgery is 80-180 mg/dL.  ?         ?Do not wear jewelry or makeup ?Do not wear lotions, powders, perfumes/colognes, or deodorant. ?Do not shave 48 hours prior to surgery.  Men may shave face and neck. ?Do not bring valuables to the hospital. ?Do not wear nail polish, gel polish, artificial nails, or any other type of covering on natural nails (fingers and toes) ?If you have artificial nails or gel coating that need to be removed by a nail salon, please have this removed prior to surgery. Artificial nails or gel coating may interfere with anesthesia's ability to adequately monitor your vital signs. ? ?[facility] is not responsible for any belongings or valuables. .  ? ?Do NOT Smoke (Tobacco/Vaping)  24 hours prior to your procedure ? ?If you use a CPAP at night, you may bring your mask for your overnight stay. ?  ?Contacts, glasses, hearing aids, dentures or partials may not be worn into surgery, please bring cases for these belongings ?  ?For patients admitted to the hospital, discharge time will be determined by your treatment team. ?  ?Patients discharged the day of surgery will not be allowed to drive home, and someone needs to stay with them for 24 hours. ? ? ?SURGICAL WAITING ROOM VISITATION ?Patients having surgery or a procedure in a hospital may have two support people. ?Children under the age of 66 must have an adult with them who is not the patient. ?They may stay in the waiting area during the procedure and may switch out with other visitors. If the patient needs to stay at the hospital during part of their recovery, the visitor guidelines for inpatient rooms apply. ? ?Please refer to the [place] website for the visitor guidelines for Inpatients (after your surgery is over and you are in a regular  room).  ? ? ? ? ? ?Special instructions:   ? ?Oral Hygiene is also important to reduce your risk of infection.  Remember - BRUSH YOUR TEETH THE MORNING OF SURGERY WITH YOUR REGULAR TOOTHPASTE ? ? ?[facility]- Preparing For Surgery ? ?Before surgery, you can play an important role. Because skin is not sterile, your skin needs to be as free of germs as possible. You can reduce the number of germs on your skin by washing with CHG (chlorahexidine gluconate) Soap before surgery.  CHG is an antiseptic cleaner which kills germs and bonds with the skin to continue killing germs even after washing.   ? ? ?Please do not use if you have an allergy to CHG or antibacterial soaps. If your skin becomes reddened/irritated stop using the CHG.  ?Do not shave (including legs and underarms) for at least 48 hours prior to  first CHG shower. It is OK to shave your face. ? ?Please follow these instructions carefully. ?  ? ? Shower the NIGHT BEFORE SURGERY and the MORNING OF SURGERY with CHG Soap.  ? If you chose to wash your hair, wash your hair first as usual with your normal shampoo. After you shampoo, rinse your hair and body thoroughly to remove the shampoo.  Then [employer] and genitals (private parts) with your normal soap and rinse thoroughly to remove soap. ? ?After that Use CHG Soap as you would any other liquid soap. You can apply CHG directly to the skin and wash gently with a scrungie or a clean washcloth.  ? ?Apply the CHG Soap to your body ONLY FROM THE NECK DOWN.  Do not use on open wounds or open sores. Avoid contact with your eyes, ears, mouth and genitals (private parts). Wash Face and genitals (private parts)  with your normal soap.  ? ?Wash thoroughly, paying special attention to the area where your surgery will be performed. ? ?Thoroughly rinse your body with warm water from the neck down. ? ?DO NOT shower/wash with your normal soap after using and rinsing off the CHG Soap. ? ?Pat yourself dry with a CLEAN  TOWEL. ? ?Wear CLEAN PAJAMAS to bed the night before surgery ? ?Place CLEAN SHEETS on your bed the night before your surgery ? ?DO NOT SLEEP WITH PETS. ? ? ?Day of Surgery: ? ?Take a shower with CHG soap. ?Wear Clean

## 2021-05-15 NOTE — Progress Notes (Signed)
[employer] sent to Dr. Marlou Sa informing him of pt's Hgb A1C  result today-12.6. Also secure chatted results to [facility] PA.  ?

## 2021-05-18 ENCOUNTER — Telehealth: Payer: Self-pay | Admitting: Orthopedic Surgery

## 2021-05-18 NOTE — Telephone Encounter (Signed)
Called patient to let her know shoulder surgery with Dr. Marlou Sa on 05-19-21 is cancelled due to A1c results at 12.6.  Patient states she sees Dr. Leota Sauers for her diabetes.  She will call to make an appointment with her PCP and will call our office back in 2-3 months while she tries to get her A1 down below 8.   ?

## 2021-05-18 NOTE — Anesthesia Preprocedure Evaluation (Deleted)
Anesthesia Evaluation  Patient identified by MRN, date of birth, ID band Patient awake    Reviewed: Allergy & Precautions, NPO status , Patient's Chart, lab work & pertinent test results, reviewed documented beta blocker date and time   History of Anesthesia Complications (+) PONV and history of anesthetic complications  Airway Mallampati: II  TM Distance: >3 FB Neck ROM: Full    Dental  (+) Dental Advisory Given, Upper Dentures, Poor Dentition, Missing   Pulmonary    Pulmonary exam normal breath sounds clear to auscultation       Cardiovascular hypertension, Pt. on home beta blockers and Pt. on medications + CAD, + Cardiac Stents and + Peripheral Vascular Disease  + dysrhythmias Atrial Fibrillation  Rhythm:Irregular Rate:Abnormal     Neuro/Psych PSYCHIATRIC DISORDERS Anxiety Depression Dementia negative neurological ROS     GI/Hepatic GERD  Medicated,  Endo/Other  diabetes, Well Controlled, Type 2  Renal/GU Renal InsufficiencyRenal disease     Musculoskeletal  (+) Arthritis , LEFT ELBOW FRACTURE WITH LIGAMENT TEAR   Abdominal   Peds  Hematology   Anesthesia Other Findings   Reproductive/Obstetrics                            Anesthesia Physical Anesthesia Plan  ASA: 3  Anesthesia Plan: General   Post-op Pain Management: Regional block* and Tylenol PO (pre-op)*   Induction:   PONV Risk Score and Plan: 4 or greater and Propofol infusion, Dexamethasone and Ondansetron  Airway Management Planned: Oral ETT  Additional Equipment:   Intra-op Plan:   Post-operative Plan: Extubation in OR  Informed Consent: I have reviewed the patients History and Physical, chart, labs and discussed the procedure including the risks, benefits and alternatives for the proposed anesthesia with the patient or authorized representative who has indicated his/her understanding and acceptance.     Dental  advisory given  Plan Discussed with: CRNA  Anesthesia Plan Comments: (See APP note by A. Mysty Kielty, FNP )       Anesthesia Quick Evaluation  

## 2021-05-18 NOTE — Progress Notes (Signed)
Anesthesia Chart Review: ? ? Case: [id number] Date/Time: 05/19/21 1100  ? Procedure: RIGHT SHOULDER ARTHROSCOPY, DEBRIDEMENT, BICEPS TENODESIS, MINI OPEN ROTATOR CUFF TEAR REPAIR (Right)  ? Anesthesia type: General  ? Pre-op diagnosis: right shoulder rotator cuff tear, biceps tendonitis  ? Location: [facility] OR ROOM 06 / [place] [facility]  ? Surgeons: Meredith Pel, MD  ? ?  ? ? ?DISCUSSION: ?Pt is 66 years old with hx HTN, DM ? ?Pt had hypoglycemic event at pre-admission testing; glucose was 48, improved to 106 after juice and glucose gel. ? ?HbA1c is 12.6. RN notified Dr. Marlou Sa and Annie Main, PA. I routed this result to PCP for f/u ? ? ?VS: BP (!) 161/83   Pulse 69   Temp 36.5 ?C (Oral)   Resp 17   Ht '4\' 9"'$  (1.448 m)   Wt 70.1 kg   BMI 33.43 kg/m?  ? ?PROVIDERS: ?- PCP is Chow, Bebe Shaggy, MD ? ? ?LABS:  ?- HbA1c is 12.6.   ? ?(all labs ordered are listed, but only abnormal results are displayed) ? ?Labs Reviewed  ?CBC - Abnormal; Notable for the following components:  ?    Result Value  ? WBC 12.6 (*)   ? RBC 3.81 (*)   ? Hemoglobin 11.0 (*)   ? HCT 34.3 (*)   ? All other components within normal limits  ?BASIC METABOLIC PANEL - Abnormal; Notable for the following components:  ? CO2 21 (*)   ? Glucose, Bld 107 (*)   ? BUN 29 (*)   ? Creatinine, Ser 1.20 (*)   ? GFR, Estimated 50 (*)   ? All other components within normal limits  ?HEMOGLOBIN A1C - Abnormal; Notable for the following components:  ? Hgb A1c MFr Bld 12.6 (*)   ? All other components within normal limits  ?GLUCOSE, CAPILLARY - Abnormal; Notable for the following components:  ? Glucose-Capillary 48 (*)   ? All other components within normal limits  ?GLUCOSE, CAPILLARY - Abnormal; Notable for the following components:  ? Glucose-Capillary 69 (*)   ? All other components within normal limits  ?GLUCOSE, CAPILLARY - Abnormal; Notable for the following components:  ? Glucose-Capillary 106 (*)   ? All other components within normal limits  ? ? ? ?EKG 05/15/21: NSR.  Minimal voltage criteria for LVH, may be normal variant. Poor R wave progression ? ? ?CV:  ?Cardiac cath 04/27/11:  ?1. Normal coronary arteries, right dominant circulation. ?2. Evaluation for non cardiac chest pain. ? ?Past Medical History:  ?Diagnosis Date  ? Anemia   ? Asthma   ? Back pain   ? 02/2019 getting injections  ? Carpal tunnel syndrome   ? Diabetes mellitus   ? type 2   ? Fibroid tumor   ? Ganglion cyst   ? GERD (gastroesophageal reflux disease)   ? will see Dr. Tammi Klippel 04/2018  ? Headache(784.0)   ? Heart murmur   ? saw dr. Einar Gip (only sees if needed, not seen since 2011)  ? Hypertension   ? dr Einar Gip     ? Hypothyroidism   ? Shortness of breath   ? on exertion   ? Shoulder pain   ? Tendonitis   ? left shoulder biceps tendonitis, acromioclavicular osteoarthritis  ? Tennis elbow   ? Thyroid disease   ? ? ?Past Surgical History:  ?Procedure Laterality Date  ? ABDOMINAL HYSTERECTOMY    ? ANTERIOR LAT LUMBAR FUSION N/A 01/03/2013  ? Procedure: X LIF (Extreme Lateral Interbody  Fusion) L3-L4,  (1 LEVEL) ;  Surgeon: Melina Schools, MD;  Location: [place];  Service: Orthopedics;  Laterality: N/A;  ? BACK SURGERY    ? x5  ? CARDIAC CATHETERIZATION    ? CHOLECYSTECTOMY    ? COLONOSCOPY  04/18/2017  ? Dr Juanita Craver ; [place]   ? EYE SURGERY  05/23/2017  ? laser of right eye, to have left eye done on 06-13-17 - for cataracts   ? KNEE ARTHROSCOPY Right 04/2014  ? LAPAROSCOPY N/A 05/30/2017  ? Procedure: LAPAROSCOPY DIAGNOSTIC;  Surgeon: Greer Pickerel, MD;  [facility]: [facility];  Service: General;  Laterality: N/A;  ? LEFT HEART CATHETERIZATION WITH CORONARY ANGIOGRAM N/A 04/27/2011  ? Procedure: LEFT HEART CATHETERIZATION WITH CORONARY ANGIOGRAM;  Surgeon: Laverda Page, MD;  Location: [facility] CATH LAB;  Service: Cardiovascular;  Laterality: N/A;  ? pelvic bone removed    ? ROTATOR CUFF REPAIR Left   ? x3 surgeries; Dr Nicki Reaper [facility]   ? SHOULDER ARTHROSCOPY WITH SUBACROMIAL DECOMPRESSION,  ROTATOR CUFF REPAIR AND BICEP TENDON REPAIR Left 02/03/2016  ? Procedure: SHOULDER ARTHROSCOPY WITH SUBACROMIAL DECOMPRESSION, DEBRIDEMENT, BICEPS TENODESIS, DISTAL CLAVICLE EXCISION.;  Surgeon: Meredith Pel, MD;  Location: [place];  Service: Orthopedics;  Laterality: Left;  ? THYROID SURGERY    ? LUMP REMOVED  ? TUBAL LIGATION    ? ? ?MEDICATIONS: ? albuterol (PROVENTIL) (2.5 MG/3ML) 0.083% nebulizer solution  ? albuterol (VENTOLIN HFA) 108 (90 Base) MCG/ACT inhaler  ? amLODipine (NORVASC) 5 MG tablet  ? beclomethasone (QVAR) 80 MCG/ACT inhaler  ? desloratadine (CLARINEX) 5 MG tablet  ? dexlansoprazole (DEXILANT) 60 MG capsule  ? FLOVENT HFA 44 MCG/ACT inhaler  ? Galcanezumab-gnlm (EMGALITY) 120 MG/ML SOAJ  ? HUMALOG KWIKPEN 100 UNIT/ML KwikPen  ? HUMULIN N KWIKPEN 100 UNIT/ML Kiwkpen  ? hydrocortisone cream 0.5 %  ? Lactobacillus TABS  ? levothyroxine (SYNTHROID) 75 MCG tablet  ? lidocaine (XYLOCAINE) 5 % ointment  ? lisinopril (ZESTRIL) 10 MG tablet  ? ondansetron (ZOFRAN) 4 MG tablet  ? oxyCODONE-acetaminophen (PERCOCET) 10-325 MG tablet  ? oxymorphone (OPANA ER) 30 MG 12 hr tablet  ? polyethylene glycol (MIRALAX / GLYCOLAX) packet  ? rosuvastatin (CRESTOR) 40 MG tablet  ? topiramate (TOPAMAX) 100 MG tablet  ? ?No current facility-administered medications for this encounter.  ? ? ?If glucose acceptable day of surgery, I anticipate pt can proceed with surgery as scheduled. ? ?Willeen Cass, PhD, FNP-BC ?[facility]/Anesthesiology ?Phone: [phone] ?05/18/2021 8:41 AM ? ? ? ? ? ? ?

## 2021-05-19 ENCOUNTER — Ambulatory Visit (HOSPITAL_COMMUNITY): Admission: RE | Admit: 2021-05-19 | Payer: Medicare Other | Source: Home / Self Care | Admitting: Orthopedic Surgery

## 2021-05-19 ENCOUNTER — Encounter (HOSPITAL_COMMUNITY): Admission: RE | Payer: Self-pay | Source: Home / Self Care

## 2021-05-19 DIAGNOSIS — Z01818 Encounter for other preprocedural examination: Secondary | ICD-10-CM

## 2021-05-19 SURGERY — SHOULDER ARTHROSCOPY WITH OPEN ROTATOR CUFF REPAIR AND DISTAL CLAVICLE ACROMINECTOMY
Anesthesia: General | Laterality: Right

## 2021-05-27 ENCOUNTER — Encounter: Payer: Medicare Other | Admitting: Orthopedic Surgery

## 2021-06-19 ENCOUNTER — Other Ambulatory Visit: Payer: Self-pay | Admitting: Nurse Practitioner

## 2021-07-01 DIAGNOSIS — M961 Postlaminectomy syndrome, not elsewhere classified: Secondary | ICD-10-CM | POA: Diagnosis not present

## 2021-07-01 DIAGNOSIS — M5136 Other intervertebral disc degeneration, lumbar region: Secondary | ICD-10-CM | POA: Diagnosis not present

## 2021-07-01 DIAGNOSIS — M5416 Radiculopathy, lumbar region: Secondary | ICD-10-CM | POA: Diagnosis not present

## 2021-07-01 DIAGNOSIS — Z79891 Long term (current) use of opiate analgesic: Secondary | ICD-10-CM | POA: Diagnosis not present

## 2021-07-01 DIAGNOSIS — G894 Chronic pain syndrome: Secondary | ICD-10-CM | POA: Diagnosis not present

## 2021-07-01 DIAGNOSIS — Z981 Arthrodesis status: Secondary | ICD-10-CM | POA: Diagnosis not present

## 2021-07-15 ENCOUNTER — Other Ambulatory Visit: Payer: Self-pay | Admitting: Nurse Practitioner

## 2021-07-22 ENCOUNTER — Other Ambulatory Visit: Payer: Self-pay | Admitting: Nurse Practitioner

## 2021-08-10 ENCOUNTER — Ambulatory Visit: Payer: Medicare Other | Admitting: Family Medicine

## 2021-09-15 ENCOUNTER — Other Ambulatory Visit: Payer: Self-pay | Admitting: Family

## 2021-09-15 DIAGNOSIS — Z1231 Encounter for screening mammogram for malignant neoplasm of breast: Secondary | ICD-10-CM

## 2021-09-17 ENCOUNTER — Other Ambulatory Visit: Payer: Self-pay

## 2021-10-22 ENCOUNTER — Other Ambulatory Visit: Payer: Self-pay | Admitting: Nurse Practitioner

## 2021-10-28 ENCOUNTER — Ambulatory Visit: Payer: Medicare Other

## 2021-11-06 ENCOUNTER — Other Ambulatory Visit: Payer: Self-pay | Admitting: Nurse Practitioner

## 2021-11-16 ENCOUNTER — Other Ambulatory Visit: Payer: Self-pay

## 2021-11-16 MED ORDER — EMGALITY 120 MG/ML ~~LOC~~ SOAJ: 120.0000 mg | SUBCUTANEOUS | 3 refills | Status: DC

## 2021-11-25 ENCOUNTER — Other Ambulatory Visit: Payer: Self-pay | Admitting: Infectious Diseases

## 2021-11-25 DIAGNOSIS — Z1231 Encounter for screening mammogram for malignant neoplasm of breast: Secondary | ICD-10-CM

## 2021-12-16 ENCOUNTER — Telehealth: Payer: Self-pay | Admitting: Family Medicine

## 2021-12-16 ENCOUNTER — Other Ambulatory Visit: Payer: Self-pay

## 2021-12-16 MED ORDER — TOPIRAMATE 100 MG PO TABS
100.0000 mg | ORAL_TABLET | Freq: Two times a day (BID) | ORAL | 0 refills | Status: DC
Start: 2021-12-16 — End: 2022-01-21

## 2021-12-16 MED ORDER — EMGALITY 120 MG/ML ~~LOC~~ SOAJ: 120.0000 mg | SUBCUTANEOUS | 0 refills | Status: DC

## 2021-12-16 NOTE — Telephone Encounter (Signed)
Pt is calling. Requesting a refill on medication Galcanezumab-gnlm (EMGALITY) 120 MG/ML SOAJ and topiramate (TOPAMAX) 100 MG tablet. Refill should be sent to [facility]

## 2021-12-16 NOTE — Telephone Encounter (Signed)
Called pt and setup an sooner for Rx refill request.Pt verbalized understanding.Pt had no questions at this time but was encouraged to call back if questions arise.

## 2021-12-30 ENCOUNTER — Ambulatory Visit: Payer: Medicare Other

## 2022-01-13 ENCOUNTER — Ambulatory Visit: Payer: Medicare Other | Admitting: Family Medicine

## 2022-01-21 ENCOUNTER — Other Ambulatory Visit: Payer: Self-pay

## 2022-01-21 MED ORDER — TOPIRAMATE 100 MG PO TABS: 100.0000 mg | ORAL_TABLET | Freq: Two times a day (BID) | ORAL | 1 refills | Status: DC

## 2022-01-25 ENCOUNTER — Ambulatory Visit: Payer: Medicare Other | Admitting: Orthopedic Surgery

## 2022-02-01 ENCOUNTER — Ambulatory Visit: Payer: Medicare Other | Admitting: Internal Medicine

## 2022-02-01 NOTE — Progress Notes (Deleted)
Name: Brianna Mitchell  MRN/ DOB: [id number], Dec 10, 1955   Age/ Sex: 66 y.o., female    PCP: Penelope Galas, MD   Reason for Endocrinology Evaluation: Type 2 Diabetes Mellitus     Date of Initial Endocrinology Visit: 02/01/2022     PATIENT IDENTIFIER: [name] is a 66 y.o. female with a past medical history of HTN, DM, Hypothyroidism and dyslipidemia . The patient presented for initial endocrinology clinic visit on 02/01/2022 for consultative assistance with her diabetes management.    HPI: [name] was    Diagnosed with DM years ago Prior Medications tried/Intolerance: *** Currently checking blood sugars *** x / day,  before breakfast and ***.  Hypoglycemia episodes : ***               Symptoms: ***                 Frequency: ***/  Hemoglobin A1c has ranged from 8.6% in 2011, peaking at 12.6% in 2019. Patient required assistance for hypoglycemia:  Patient has required hospitalization within the last 1 year from hyper or hypoglycemia:   In terms of diet, the patient ***  She follows with Ortho for a right tear of the rotator cuff     HOME DIABETES REGIMEN: Humulin-N Humalog    Statin: yes ACE-I/ARB: yes    METER DOWNLOAD SUMMARY: Date range evaluated: *** Fingerstick Blood Glucose Tests = *** Average Number Tests/Day = *** Overall Mean FS Glucose = *** Standard Deviation = ***  BG Ranges: Low = *** High = ***   Hypoglycemic Events/30 Days: BG < 50 = *** Episodes of symptomatic severe hypoglycemia = ***   DIABETIC COMPLICATIONS: Microvascular complications:  CKD III Denies: *** Last eye exam: Completed   Macrovascular complications:  *** Denies: CAD, PVD, CVA   PAST HISTORY: Past Medical History:  Past Medical History:  Diagnosis Date   Anemia    Asthma    Back pain    02/2019 getting injections   Carpal tunnel syndrome    Diabetes mellitus    type 2    Fibroid tumor    Ganglion cyst    GERD (gastroesophageal reflux disease)     will see Dr. Kathrynn Running 04/2018   Headache(784.0)    Heart murmur    saw dr. Jacinto Halim (only sees if needed, not seen since 2011)   Hypertension    dr Jacinto Halim      Hypothyroidism    Shortness of breath    on exertion    Shoulder pain    Tendonitis    left shoulder biceps tendonitis, acromioclavicular osteoarthritis   Tennis elbow    Thyroid disease    Past Surgical History:  Past Surgical History:  Procedure Laterality Date   ABDOMINAL HYSTERECTOMY     ANTERIOR LAT LUMBAR FUSION N/A 01/03/2013   Procedure: X LIF (Extreme Lateral Interbody Fusion) L3-L4,  (1 LEVEL) ;  Surgeon: Venita Lick, MD;  Location: [facility] OR;  Service: Orthopedics;  Laterality: N/A;   BACK SURGERY     x5   CARDIAC CATHETERIZATION     CHOLECYSTECTOMY     COLONOSCOPY  04/18/2017   Dr Charna Elizabeth ; [facility]    EYE SURGERY  05/23/2017   laser of right eye, to have left eye done on 06-13-17 - for cataracts    KNEE ARTHROSCOPY Right 04/2014   LAPAROSCOPY N/A 05/30/2017   Procedure: LAPAROSCOPY DIAGNOSTIC;  Surgeon: Gaynelle Adu, MD;  [facility];  Service: General;  Laterality: N/A;   LEFT HEART CATHETERIZATION WITH CORONARY ANGIOGRAM N/A 04/27/2011   Procedure: LEFT HEART CATHETERIZATION WITH CORONARY ANGIOGRAM;  Surgeon: Pamella Pert, MD;  Location: [facility] CATH LAB;  Service: Cardiovascular;  Laterality: N/A;   pelvic bone removed     ROTATOR CUFF REPAIR Left    x3 surgeries; Dr Lorin Picket [facility]    SHOULDER ARTHROSCOPY WITH SUBACROMIAL DECOMPRESSION, ROTATOR CUFF REPAIR AND BICEP TENDON REPAIR Left 02/03/2016   Procedure: SHOULDER ARTHROSCOPY WITH SUBACROMIAL DECOMPRESSION, DEBRIDEMENT, BICEPS TENODESIS, DISTAL CLAVICLE EXCISION.;  Surgeon: Cammy Copa, MD;  [facility];  Service: Orthopedics;  Laterality: Left;   THYROID SURGERY     LUMP REMOVED   TUBAL LIGATION      Social History:  reports that she has never smoked. She has never used smokeless tobacco. She reports that  she does not drink alcohol and does not use drugs. Family History:  Family History  Problem Relation Age of Onset   Heart disease Mother    Heart disease Father    Breast cancer Sister    Heart disease Sister    Heart disease Brother    Heart disease Paternal Uncle      HOME MEDICATIONS: Allergies as of 02/01/2022       Reactions   Aspirin Swelling   Sulfa Antibiotics Swelling   Demerol Rash   Penicillins Rash, Other (See Comments)   Has patient had a PCN reaction causing immediate rash, facial/tongue/throat swelling, SOB or lightheadedness with hypotension: No Has patient had a PCN reaction causing severe rash involving mucus membranes or skin necrosis: No Has patient had a PCN reaction that required hospitalization: Yes Has patient had a PCN reaction occurring within the last 10 years: No  If all of the above answers are "NO", then may proceed with Cephalosporin use.        Medication List        Accurate as of February 01, 2022  7:17 AM. If you have any questions, ask your nurse or doctor.          albuterol (2.5 MG/3ML) 0.083% nebulizer solution Commonly known as: PROVENTIL Take 3 mLs (2.5 mg total) by nebulization every 6 (six) hours as needed for wheezing or shortness of breath.   albuterol 108 (90 Base) MCG/ACT inhaler Commonly known as: VENTOLIN HFA Inhale 2 puffs into the lungs every 6 (six) hours as needed for wheezing or shortness of breath.   amLODipine 5 MG tablet Commonly known as: NORVASC Take 5 mg by mouth daily.   beclomethasone 80 MCG/ACT inhaler Commonly known as: QVAR Inhale 2 puffs into the lungs once for 1 dose. What changed:  when to take this reasons to take this   desloratadine 5 MG tablet Commonly known as: Clarinex Take 1 tablet (5 mg total) by mouth daily.   dexlansoprazole 60 MG capsule Commonly known as: Dexilant Take 1 capsule (60 mg total) by mouth daily.   Emgality 120 MG/ML Soaj Generic drug:  Galcanezumab-gnlm Inject 120 mg into the skin every 30 (thirty) days.   Flovent HFA 44 MCG/ACT inhaler Generic drug: fluticasone INHALE ONE PUFF into THE lungs TWICE DAILY   HumaLOG KwikPen 100 UNIT/ML KwikPen Generic drug: insulin lispro Inject 5-18 Units into the skin 3 (three) times daily. Based on sliding scale 70-100= 5 units, 101-150 = 7 units, 151-200 = 9 units, 201-250 = 11 units, 251-300 = 13 units, 301-350 = 15 units, > 350= 18 units What changed:  how  much to take additional instructions   HumuLIN N KwikPen 100 UNIT/ML Kiwkpen Generic drug: Insulin NPH (Human) (Isophane) Inject 45-55 Units into the skin See admin instructions. Inject 55 units SQ in the morning and inject 30 units SQ in the evening What changed:  how much to take when to take this additional instructions   hydrocortisone cream 0.5 % Apply 1 application topically 2 (two) times daily.   Lactobacillus Tabs Take 1 tablet by mouth daily.   levothyroxine 75 MCG tablet Commonly known as: SYNTHROID Take 1 tablet (75 mcg total) by mouth daily before breakfast.   lidocaine 5 % ointment Commonly known as: XYLOCAINE Apply 1 application. topically as needed for moderate pain.   lisinopril 10 MG tablet Commonly known as: ZESTRIL Take 10 mg by mouth daily.   ondansetron 4 MG tablet Commonly known as: ZOFRAN Take 4 mg by mouth every 8 (eight) hours as needed for nausea or vomiting.   oxyCODONE-acetaminophen 10-325 MG tablet Commonly known as: PERCOCET Take 1 tablet by mouth every 6 (six) hours as needed for pain.   oxymorphone 30 MG 12 hr tablet Commonly known as: OPANA ER Take 30 mg by mouth every 12 (twelve) hours.   polyethylene glycol 17 g packet Commonly known as: MIRALAX / GLYCOLAX Take 17 g by mouth daily. What changed:  when to take this reasons to take this   rosuvastatin 40 MG tablet Commonly known as: CRESTOR TAKE ONE TABLET BY MOUTH EVERY DAY   topiramate 100 MG tablet Commonly  known as: TOPAMAX Take 1 tablet (100 mg total) by mouth 2 (two) times daily. Please keep upcoming appt for continued refills         ALLERGIES: Allergies  Allergen Reactions   Aspirin Swelling   Sulfa Antibiotics Swelling   Demerol Rash   Penicillins Rash and Other (See Comments)    Has patient had a PCN reaction causing immediate rash, facial/tongue/throat swelling, SOB or lightheadedness with hypotension: No Has patient had a PCN reaction causing severe rash involving mucus membranes or skin necrosis: No Has patient had a PCN reaction that required hospitalization: Yes Has patient had a PCN reaction occurring within the last 10 years: No  If all of the above answers are "NO", then may proceed with Cephalosporin use.      REVIEW OF SYSTEMS: A comprehensive ROS was conducted with the patient and is negative except as per HPI and below:  ROS    OBJECTIVE:   VITAL SIGNS: There were no vitals taken for this visit.   PHYSICAL EXAM:  General: Pt appears well and is in NAD  Neck: General: Supple without adenopathy or carotid bruits. Thyroid: Thyroid size normal.  No goiter or nodules appreciated.   Lungs: Clear with good BS bilat with no rales, rhonchi, or wheezes  Heart: RRR   Abdomen:  soft, nontender  Extremities:  Lower extremities - No pretibial edema. No lesions.  Skin: Normal texture and temperature to palpation. No rash noted.  Neuro: MS is good with appropriate affect, pt is alert and Ox3    DM foot exam:    DATA REVIEWED:  Lab Results  Component Value Date   HGBA1C 12.6 (H) 05/15/2021   HGBA1C 9.3 (H) 07/20/2018   HGBA1C 9.4 (H) 03/29/2018   Lab Results  Component Value Date   MICROALBUR 150 03/08/2018   LDLCALC 81 03/08/2018   CREATININE 1.20 (H) 05/15/2021   Lab Results  Component Value Date   MICRALBCREAT >300 03/08/2018  Lab Results  Component Value Date   CHOL 178 03/08/2018   HDL 86 03/08/2018   LDLCALC 81 03/08/2018   TRIG 55  03/08/2018   CHOLHDL 2.1 03/08/2018        ASSESSMENT / PLAN / RECOMMENDATIONS:   1) Type *** Diabetes Mellitus, ***controlled, With CKD III complications - Most recent A1c of *** %. Goal A1c < *** %.  ***  Plan: GENERAL: ***  MEDICATIONS: ***  EDUCATION / INSTRUCTIONS: BG monitoring instructions: Patient is instructed to check her blood sugars *** times a day, ***. Call  Endocrinology clinic if: BG persistently < 70  I reviewed the Rule of 15 for the treatment of hypoglycemia in detail with the patient. Literature supplied.   2) Diabetic complications:  Eye: Does *** have known diabetic retinopathy.  Neuro/ Feet: Does *** have known diabetic peripheral neuropathy. Renal: Patient does  have known baseline CKD. She is *** on an ACEI/ARB at present.  3) Lipids: Patient is *** on a statin.         Signed electronically by: Lyndle Herrlich, MD  [facility] Endocrinology  [facility] [address] [place], [place] [address] Phone: [phone] FAX: [phone]   CC: Penelope Galas, MD [address] [place] [address] Phone: [phone]  Fax: [phone]    Return to Endocrinology clinic as below: Future Appointments  Date Time Provider Department Center  02/01/2022  9:30 AM Remo Kirschenmann, Konrad Dolores, MD [facility] None  02/22/2022  3:15 PM August Saucer Corrie Mckusick, MD [facility] None  03/16/2022  2:30 PM Shawnie Dapper, NP [facility] None  06/21/2022  1:30 PM Lomax, Amy, NP [facility] None

## 2022-02-22 ENCOUNTER — Ambulatory Visit: Payer: Medicare Other | Admitting: Orthopedic Surgery

## 2022-03-01 ENCOUNTER — Other Ambulatory Visit: Payer: Self-pay

## 2022-03-01 MED ORDER — TOPIRAMATE 100 MG PO TABS: 100.0000 mg | ORAL_TABLET | Freq: Two times a day (BID) | ORAL | 1 refills | Status: DC

## 2022-03-08 ENCOUNTER — Ambulatory Visit: Payer: Medicare Other | Admitting: Orthopedic Surgery

## 2022-03-11 ENCOUNTER — Other Ambulatory Visit: Payer: Self-pay | Admitting: Nurse Practitioner

## 2022-03-16 ENCOUNTER — Encounter: Payer: Self-pay | Admitting: Family Medicine

## 2022-03-16 ENCOUNTER — Ambulatory Visit (INDEPENDENT_AMBULATORY_CARE_PROVIDER_SITE_OTHER): Payer: 59 | Admitting: Family Medicine

## 2022-03-16 VITALS — BP 150/80 | HR 82 | Ht <= 58 in | Wt 155.8 lb

## 2022-03-16 DIAGNOSIS — G43019 Migraine without aura, intractable, without status migrainosus: Secondary | ICD-10-CM | POA: Diagnosis not present

## 2022-03-16 MED ORDER — TOPIRAMATE 100 MG PO TABS: 100.0000 mg | ORAL_TABLET | Freq: Two times a day (BID) | ORAL | 3 refills | Status: DC

## 2022-03-16 MED ORDER — AJOVY 225 MG/1.5ML ~~LOC~~ SOAJ: 225.0000 mg | SUBCUTANEOUS | 3 refills | Status: DC

## 2022-03-16 NOTE — Progress Notes (Signed)
Chief Complaint  Patient presents with   Follow-up    EMG room 3, alone. Last seen 04/29/20. Headaches still the same. Gets daily headaches. Unable to sleep. Taking topamax, emgality, tylenol '500mg'$ .     HISTORY OF PRESENT ILLNESS: 03/16/22 ALL:  Brianna Mitchell returns for follow up for migraines. She was last seen 04/2020 and Amovig switched to [employer]. Topiramate '100mg'$  BID continued. Since, She reports having daily headaches. No improvement on Emgality. She reports continuing meds consistently although she has not been seen in 2 years. She continues Percocet TID-QID and Opanas BID. She also takes Tylenol but unclear how often. She reports having three people in her family die this month. She has been under more stress.   04/29/2020 ALL: Brianna Mitchell is a 67 y.o. female here today for follow up for migraines. She was last seen 02/2019 by Dr Leta Baptist who increased Amovig to '140mg'$  monthly and topiramate to '100mg'$  BID. Tylenol advised for abortive therapy. She does feel that it helped with intensity but she continues to have daily headaches. She describes a constant sharp stabbing pain all over her head. Occasionally throbbing with nausea and light/sound sensitivity. She admits to increased stress. Her mother recently suffered a heart attack. She lost several family members to Covid. Blood sugars have been elevated. Blood pressures are elevated. Last A1C was 12. She has appt with endocrinology next week. She does not sleep well. She wakes from dreams. She does snore.  She reports having a sleep study over 10 years ago. Unsure of results. She does not feel she can tolerate CPAP. She has chronic back pain. She is followed by Dr Rolena Infante. She takes Percocet 10-325 QID and Opana '30mg'$  BID.   HISTORY (copied from Dr Gladstone Lighter previous note)  UPDATE (02/28/19, VRP): Since last visit, HA continue ("all the time"). Tolerating aimovig and topiramate. Symptoms are stable. Severity is moderate. No alleviating or  aggravating factors. Tolerating meds.   UPDATE (06/26/18, VRP): Since last visit, doing poorly. HA symptoms are continuing. Severity is high. No alleviating or aggravating factors. Tolerating meds. More stress. Had 3 family members die of COVID 70 (living in [place]).   UPDATE (11/16/16, VRP): Since last visit, doing poorly. More back pain, headaches, insomnia. Tolerating topiramate and rizatriptan.   UPDATE 07/29/15: Since last visit, symptoms stable. Daily HA + intermittent headaches. Patient had to cancel sleep study due to family issues.   UPDATE 01/01/15: Since last visit, still with daily HA. Also with 5 severe migraines per week.   PRIOR HPI (03/12/14): 67 year old ambidextrous female here for evaluation of headaches. Since age 36 years old patient has had intermittent global, severe throbbing headaches, with blurred vision, irritability, nausea and photophobia and phonophobia. Around 2008 patient was evaluated by [facility], dx'd with migraine, and prescribed topiramate 25 mg twice a day. Patient then followed up with PCP who increased this to 50 mg twice a day for past 2-3 yrs. Patient continues to have multiple low-grade headaches per day. She has 3 severe migraine headaches per week. She typically tries to calm herself, relax, sometimes takes half tablet of generic Excedrin Migraine tablet. She reports allergy to aspirin and this medication has aspirin in it. She reports some itching sensation when she takes it. Patient reports remote sleep study testing more than 10 years ago but does not know the results. She is not on CPAP therapy. She has significant insomnia which she feels is related to her chronic back pain. She has  snoring, obesity, HTN  and daytime sleepiness. No specific other triggering factors for headache.     REVIEW OF SYSTEMS: Out of a complete 14 system review of symptoms, the patient complains only of the following symptoms, headaches, chronic pain, snoring  and all other reviewed systems are negative.    ALLERGIES: Allergies  Allergen Reactions   Aspirin Swelling   Sulfa Antibiotics Swelling   Demerol Rash   Penicillins Rash and Other (See Comments)    Has patient had a PCN reaction causing immediate rash, facial/tongue/throat swelling, SOB or lightheadedness with hypotension: No Has patient had a PCN reaction causing severe rash involving mucus membranes or skin necrosis: No Has patient had a PCN reaction that required hospitalization: Yes Has patient had a PCN reaction occurring within the last 10 years: No  If all of the above answers are "NO", then may proceed with Cephalosporin use.      HOME MEDICATIONS: Outpatient Medications Prior to Visit  Medication Sig Dispense Refill   albuterol (PROVENTIL) (2.5 MG/3ML) 0.083% nebulizer solution Take 3 mLs (2.5 mg total) by nebulization every 6 (six) hours as needed for wheezing or shortness of breath. 75 mL 0   albuterol (VENTOLIN HFA) 108 (90 Base) MCG/ACT inhaler Inhale 2 puffs into the lungs every 6 (six) hours as needed for wheezing or shortness of breath. 8.5 g 0   amLODipine (NORVASC) 5 MG tablet Take 5 mg by mouth daily.     desloratadine (CLARINEX) 5 MG tablet Take 1 tablet (5 mg total) by mouth daily. 90 tablet 0   dexlansoprazole (DEXILANT) 60 MG capsule Take 1 capsule (60 mg total) by mouth daily. 90 capsule 0   FLOVENT HFA 44 MCG/ACT inhaler INHALE ONE PUFF into THE lungs TWICE DAILY 10.6 g 2   HUMALOG KWIKPEN 100 UNIT/ML KwikPen Inject 5-18 Units into the skin 3 (three) times daily. Based on sliding scale 70-100= 5 units, 101-150 = 7 units, 151-200 = 9 units, 201-250 = 11 units, 251-300 = 13 units, 301-350 = 15 units, > 350= 18 units (Patient taking differently: Inject 8-12 Units into the skin 3 (three) times daily.) 15 mL 0   HUMULIN N KWIKPEN 100 UNIT/ML Kiwkpen Inject 45-55 Units into the skin See admin instructions. Inject 55 units SQ in the morning and inject 30 units SQ in  the evening (Patient taking differently: Inject 5 Units into the skin in the morning and at bedtime.) 15 mL 0   hydrocortisone cream 0.5 % Apply 1 application topically 2 (two) times daily. 30 g 0   Lactobacillus TABS Take 1 tablet by mouth daily. 90 tablet 1   levothyroxine (SYNTHROID) 75 MCG tablet Take 1 tablet (75 mcg total) by mouth daily before breakfast. 90 tablet 0   lidocaine (XYLOCAINE) 5 % ointment Apply 1 application. topically as needed for moderate pain.     lisinopril (ZESTRIL) 10 MG tablet Take 10 mg by mouth daily.     ondansetron (ZOFRAN) 4 MG tablet Take 4 mg by mouth every 8 (eight) hours as needed for nausea or vomiting.     oxyCODONE-acetaminophen (PERCOCET) 10-325 MG tablet Take 1 tablet by mouth every 6 (six) hours as needed for pain.     oxymorphone (OPANA ER) 30 MG 12 hr tablet Take 30 mg by mouth every 12 (twelve) hours.   0   polyethylene glycol (MIRALAX / GLYCOLAX) packet Take 17 g by mouth daily. (Patient taking differently: Take 17 g by mouth daily as needed for mild constipation.) 24  each 0   rosuvastatin (CRESTOR) 40 MG tablet TAKE ONE TABLET BY MOUTH EVERY DAY 90 tablet 0   Galcanezumab-gnlm (EMGALITY) 120 MG/ML SOAJ Inject 120 mg into the skin every 30 (thirty) days. 1 mL 0   topiramate (TOPAMAX) 100 MG tablet Take 1 tablet (100 mg total) by mouth 2 (two) times daily. Please keep upcoming appt for continued refills 60 tablet 1   beclomethasone (QVAR) 80 MCG/ACT inhaler Inhale 2 puffs into the lungs once for 1 dose. (Patient taking differently: Inhale 2 puffs into the lungs daily as needed (shortness of breath).) 1 each 0   No facility-administered medications prior to visit.     PAST MEDICAL HISTORY: Past Medical History:  Diagnosis Date   Anemia    Asthma    Back pain    02/2019 getting injections   Carpal tunnel syndrome    Diabetes mellitus    type 2    Fibroid tumor    Ganglion cyst    GERD (gastroesophageal reflux disease)    will see Dr. Tammi Klippel  04/2018   Headache(784.0)    Heart murmur    saw dr. Einar Gip (only sees if needed, not seen since 2011)   Hypertension    dr Einar Gip      Hypothyroidism    Shortness of breath    on exertion    Shoulder pain    Tendonitis    left shoulder biceps tendonitis, acromioclavicular osteoarthritis   Tennis elbow    Thyroid disease      PAST SURGICAL HISTORY: Past Surgical History:  Procedure Laterality Date   ABDOMINAL HYSTERECTOMY     ANTERIOR LAT LUMBAR FUSION N/A 01/03/2013   Procedure: X LIF (Extreme Lateral Interbody Fusion) L3-L4,  (1 LEVEL) ;  Surgeon: Melina Schools, MD;  Location: [facility];  Service: Orthopedics;  Laterality: N/A;   BACK SURGERY     x5   CARDIAC CATHETERIZATION     CHOLECYSTECTOMY     COLONOSCOPY  04/18/2017   Dr Juanita Craver ; [place]  05/23/2017   laser of right eye, to have left eye done on 06-13-17 - for cataracts    KNEE ARTHROSCOPY Right 04/2014   LAPAROSCOPY N/A 05/30/2017   Procedure: LAPAROSCOPY DIAGNOSTIC;  Surgeon: Greer Pickerel, MD;  Location: [facility];  Service: General;  Laterality: N/A;   LEFT HEART CATHETERIZATION WITH CORONARY ANGIOGRAM N/A 04/27/2011   Procedure: LEFT HEART CATHETERIZATION WITH CORONARY ANGIOGRAM;  Surgeon: Laverda Page, MD;  Location: [facility] C[facility];  Service: Cardiovascular;  Laterality: N/A;   pelvic bone removed     ROTATOR CUFF REPAIR Left    x3 surgeries; Dr Nicki Reaper Dean[facility]    SHOULDER ARTHROSCOPY WITH SUBACROMIAL DECOMPRESSION, ROTATOR CUFF REPAIR AND BICEP TENDON REPAIR Left 02/03/2016   Procedure: SHOULDER ARTHROSCOPY WITH SUBACROMIAL DECOMPRESSION, DEBRIDEMENT, BICEPS TENODESIS, DISTAL CLAVICLE EXCISION.;  Surgeon: Meredith Pel, MD;  Location: [facility];  Service: Orthopedics;  Laterality: Left;   THYROID SURGERY     LUMP REMOVED   TUBAL LIGATION       FAMILY HISTORY: Family History  Problem Relation Age of Onset   Heart disease Mother    Heart disease Father     Breast cancer Sister    Heart disease Sister    Heart disease Brother    Heart disease Paternal Uncle      SOCIAL HISTORY: Social History   Socioeconomic History   Marital status: Divorced    Spouse name: Not  on file   Number of children: Not on file   Years of education: Not on file   Highest education level: Not on file  Occupational History   Occupation: disability  Tobacco Use   Smoking status: Never   Smokeless tobacco: Never  Vaping Use   Vaping Use: Never used  Substance and Sexual Activity   Alcohol use: No   Drug use: No   Sexual activity: Yes  Other Topics Concern   Not on file  Social History Narrative   Not on file   Social Determinants of Health   Financial Resource Strain: Low Risk  (04/12/2019)   Overall Financial Resource Strain (CARDIA)    Difficulty of Paying Living Expenses: Not hard at all  Food Insecurity: No Food Insecurity (04/12/2019)   Hunger Vital Sign    Worried About Running Out of Food in the Last Year: Never true    [place] in the Last Year: Never true  Transportation Needs: No Transportation Needs (04/12/2019)   PRAPARE - Hydrologist (Medical): No    Lack of Transportation (Non-Medical): No  Physical Activity: Inactive (04/12/2019)   Exercise Vital Sign    Days of Exercise per Week: 0 days    Minutes of Exercise per Session: 0 min  Stress: No Stress Concern Present (04/12/2019)   [place]    Feeling of Stress : Not at all  Social Connections: Not on file  Intimate Partner Violence: Not At Risk (03/08/2018)   Humiliation, Afraid, Rape, and Kick questionnaire    Fear of Current or Ex-Partner: No    Emotionally Abused: No    Physically Abused: No    Sexually Abused: No      PHYSICAL EXAM  Vitals:   03/16/22 1420 03/16/22 1431  BP: (!) 152/79 (!) 150/80  Pulse: 84 82  Weight: 155 lb 12.8 oz (70.7 kg)   Height: '4\' 9"'$  (1.448  m)     Body mass index is 33.71 kg/m.   Generalized: Well developed, in no acute distress  Cardiology: normal rate and rhythm, no murmur auscultated  Respiratory: clear to auscultation bilaterally    Neurological examination  Mentation: Alert oriented to time, place, history taking. Follows all commands speech and language fluent Cranial nerve II-XII: Pupils were equal round reactive to light. Extraocular movements were full, visual field were full on confrontational test. Facial sensation and strength were normal. Head turning and shoulder shrug  were normal and symmetric. Motor: The motor testing reveals 5 over 5 strength of all 4 extremities. Good symmetric motor tone is noted throughout.  Gait and station: Gait is normal.     DIAGNOSTIC DATA (LABS, IMAGING, TESTING) - I reviewed patient records, labs, notes, testing and imaging myself where available.  Lab Results  Component Value Date   WBC 12.6 (H) 05/15/2021   HGB 11.0 (L) 05/15/2021   HCT 34.3 (L) 05/15/2021   MCV 90.0 05/15/2021   PLT 272 05/15/2021      Component Value Date/Time   NA 137 05/15/2021 1422   NA 144 07/20/2018 1433   K 3.7 05/15/2021 1422   CL 107 05/15/2021 1422   CO2 21 (L) 05/15/2021 1422   GLUCOSE 107 (H) 05/15/2021 1422   BUN 29 (H) 05/15/2021 1422   BUN 18 07/20/2018 1433   CREATININE 1.20 (H) 05/15/2021 1422   CALCIUM 9.8 05/15/2021 1422   PROT 7.3 07/20/2018 1433   ALBUMIN  4.8 07/20/2018 1433   AST 21 07/20/2018 1433   ALT 19 07/20/2018 1433   ALKPHOS 89 07/20/2018 1433   BILITOT 0.2 07/20/2018 1433   GFRNONAA 50 (L) 05/15/2021 1422   GFRAA 50 (L) 07/20/2018 1433   Lab Results  Component Value Date   CHOL 178 03/08/2018   HDL 86 03/08/2018   LDLCALC 81 03/08/2018   TRIG 55 03/08/2018   CHOLHDL 2.1 03/08/2018   Lab Results  Component Value Date   HGBA1C 12.6 (H) 05/15/2021   No results found for: "VITAMINB12" Lab Results  Component Value Date   TSH 0.726 07/20/2018         No data to display               No data to display           ASSESSMENT AND PLAN  67 y.o. year old female  has a past medical history of Anemia, Asthma, Back pain, Carpal tunnel syndrome, Diabetes mellitus, Fibroid tumor, Ganglion cyst, GERD (gastroesophageal reflux disease), Headache(784.0), Heart murmur, Hypertension, Hypothyroidism, Shortness of breath, Shoulder pain, Tendonitis, Tennis elbow, and Thyroid disease. here with   Intractable migraine without aura and without status migrainosus  Brianna Mitchell continues to have daily headaches. Most are generalized and sharp without migrainous features but she does endorse intermittent nausea, photo and phonophobia. Unclear how often she has migraine symptoms. We will switch Emgality to Ajovy every 30 days. She will continue topiramate '100mg'$  twice daily and Tylenol for abortive therapy. She was advised to monitor daily dosing of Tylenol as she continues Percocet QID. I suspect multiple factors contribute to intractable headaches. We have discussed common migraine triggers, rebound headaches and side effects of untreated sleep apnea. She does not wish to pursue sleep study. She feels pain regimen is working well to help control back pain. She does not think she can tolerate Botox therapy. She will continue close follow up with PCP and specialists as directed. Healthy lifestyle habits encouraged. She will follow up in 6 months. She verbalizes understanding and agreement with this plan.    No orders of the defined types were placed in this encounter.    Meds ordered this encounter  Medications   topiramate (TOPAMAX) 100 MG tablet    Sig: Take 1 tablet (100 mg total) by mouth 2 (two) times daily.    Dispense:  180 tablet    Refill:  3    Order Specific Question:   Supervising Provider    Answer:   Melvenia Beam [[id number]]   Fremanezumab-vfrm (AJOVY) 225 MG/1.5ML SOAJ    Sig: Inject 225 mg into the skin every 30 (thirty) days.     Dispense:  4.5 mL    Refill:  3    Order Specific Question:   Supervising Provider    Answer:   Melvenia Beam [[id number]]     Debbora Presto, MSN, FNP-C 03/16/2022, 2:55 PM  [facility] [address] [place], [place] [address] [phone]

## 2022-03-16 NOTE — Patient Instructions (Signed)
Below is our plan:  We will change Emgality to Ajovy. You will continue 1 injection of Ajovy every 30 days. Continue topiramate '100mg'$  twice daily.   Please make sure you are staying well hydrated. I recommend 50-60 ounces daily. Well balanced diet and regular exercise encouraged. Consistent sleep schedule with 6-8 hours recommended.   Please continue follow up with care team as directed.   Follow up with me in 6 months   You may receive a survey regarding today's visit. I encourage you to leave honest feed back as I do use this information to improve patient care. Thank you for seeing me today!

## 2022-03-22 ENCOUNTER — Telehealth: Payer: Self-pay | Admitting: Family Medicine

## 2022-03-22 ENCOUNTER — Telehealth: Payer: Self-pay | Admitting: *Deleted

## 2022-03-22 ENCOUNTER — Telehealth: Payer: Self-pay

## 2022-03-22 DIAGNOSIS — G43019 Migraine without aura, intractable, without status migrainosus: Secondary | ICD-10-CM

## 2022-03-22 NOTE — Telephone Encounter (Signed)
Pt called stating PA needed for Ajovy

## 2022-03-22 NOTE — Telephone Encounter (Signed)
Pt states the pharmacy has told her that a PA is needed for the [place].  Pt states her head is killing her and she needs help, pt asking for a call.

## 2022-03-22 NOTE — Telephone Encounter (Signed)
Called pt about her migraine, pt stated that she took Topamax. I told the pt I will let the doctor know and will get back in contact with her. Pt verbalized understanding.

## 2022-03-22 NOTE — Telephone Encounter (Addendum)
Brianna Mitchell called pt: Called pt about her migraine, pt stated that she took Topamax. I told the pt I will let the doctor know and will get back in contact with her. Pt verbalized understanding.       I spoke with [name] who stated pt tried tylenol but ineffective.   Routed PA Ajovy request to rx prior auth team to work on.

## 2022-03-23 ENCOUNTER — Other Ambulatory Visit: Payer: Self-pay | Admitting: Family Medicine

## 2022-03-23 MED ORDER — RIZATRIPTAN BENZOATE 10 MG PO TBDP: 10.0000 mg | ORAL_TABLET | ORAL | 11 refills | Status: DC | PRN

## 2022-03-23 NOTE — Telephone Encounter (Signed)
Patient informed with below message.

## 2022-03-23 NOTE — Telephone Encounter (Signed)
Left message for patient to call.

## 2022-03-24 ENCOUNTER — Other Ambulatory Visit (HOSPITAL_COMMUNITY): Payer: Self-pay

## 2022-03-24 NOTE — Telephone Encounter (Signed)
Submitted a Prior Authorization request to [employer] for  Ajovy  via CoverMyMeds. Will update once we receive a response.  Key: [id number] - PA Case ID: [id number]

## 2022-03-29 NOTE — Telephone Encounter (Signed)
Do you have an update on this PA?

## 2022-03-29 NOTE — Telephone Encounter (Signed)
Parrish is asking for a call to discuss the PA status for Fremanezumab-vfrm (AJOVY) 225 MG/1.5ML SOAJ , she states the pills that were called in for pt to take are gone.  Dominique @ [employer] pt informed them they did not work.  Please call pharmacy @  Phone: [phone]

## 2022-04-06 ENCOUNTER — Telehealth: Payer: Self-pay | Admitting: Family Medicine

## 2022-04-06 MED ORDER — QULIPTA 60 MG PO TABS: 1.0000 | ORAL_TABLET | Freq: Every day | ORAL | 11 refills | Status: DC

## 2022-04-06 NOTE — Telephone Encounter (Signed)
PA Qulipta approved until 02/15/2023. Request Reference Number: [id number].   Please call pt back and let her know Qulipta covered by insurance now that there is a PA approval on file. She should now be able to fill this at her pharmacy.

## 2022-04-06 NOTE — Telephone Encounter (Signed)
Patient Advocate Encounter  Received notification  that the request for prior authorization for AJOVY (fremanezumab-vfrm) injection 225MG/1.5ML auto-injectors has been denied due to e it is not on your plan's Drug List (formulary). Medication authorization requires the following: (1) You need to try this covered drug: Qulipta*; OR (2) your doctor needs to give [place] specific medical reasons why the covered drug is not appropriate for you.       Lyndel Safe, [place] Patient Advocate Specialist [place] Patient Advocate Team Direct Number: [phone]  Fax: [phone]

## 2022-04-06 NOTE — Telephone Encounter (Signed)
Pt called needing to speak to the RN about her migraine medications. She states that her insurance is not covering the pill not the injection. Please advise.

## 2022-04-06 NOTE — Telephone Encounter (Signed)
I spoke with pt this am. Ajovy not covered until she has tried/failed Qulipta first. I explained this to pt and rx Qulipta called in.   I called [facility] at [phone]. Spoke w/ rep and confirmed PA Qulipta needed.  ID: [id number]  PCN: [id number] GRP: [id number]  PA submitted on covermymeds. KeyXN:[id number]. Waiting on determination from [employer] D.

## 2022-04-06 NOTE — Telephone Encounter (Signed)
Calld pt. She denies any issues with constipation. She is agreeable to change to [place] since Ajovy not covered. Explained she will take 1 tablet po qd. Asked her to call back if she has any trouble tolerating. Confirmed she is still taking topamax 176m po BID and rizatriptan 157mpo prn (reminded her she only gets 9 tabs per 30 days).

## 2022-04-06 NOTE — Addendum Note (Signed)
Addended byUbaldo Glassing, Clinton Wahlberg L on: 04/06/2022 11:30 AM   Modules accepted: Orders

## 2022-04-06 NOTE — Addendum Note (Signed)
Addended by: Wyvonnia Lora on: 04/06/2022 10:53 AM   Modules accepted: Orders

## 2022-04-07 NOTE — Telephone Encounter (Signed)
Called pt and informed her that she should be able to pick-up [place] from the pharmacy. Pt said she will call the pharmacy @ 9 am and call the office back if there are any problems.

## 2022-04-07 NOTE — Telephone Encounter (Signed)
Noted  

## 2022-04-22 ENCOUNTER — Encounter: Payer: Self-pay | Admitting: Family Medicine

## 2022-05-06 ENCOUNTER — Other Ambulatory Visit: Payer: Self-pay | Admitting: Nurse Practitioner

## 2022-05-20 ENCOUNTER — Other Ambulatory Visit: Payer: Self-pay | Admitting: Nurse Practitioner

## 2022-06-21 ENCOUNTER — Ambulatory Visit: Payer: Medicare Other | Admitting: Family Medicine

## 2022-06-25 ENCOUNTER — Ambulatory Visit: Payer: 59 | Admitting: Podiatry

## 2022-07-01 ENCOUNTER — Ambulatory Visit: Payer: 59 | Admitting: Podiatry

## 2022-07-23 ENCOUNTER — Encounter: Payer: Self-pay | Admitting: Podiatry

## 2022-08-27 ENCOUNTER — Ambulatory Visit: Payer: 59 | Admitting: Podiatry

## 2022-09-20 ENCOUNTER — Ambulatory Visit: Payer: 59 | Admitting: Family Medicine

## 2022-12-06 ENCOUNTER — Other Ambulatory Visit: Payer: Self-pay | Admitting: *Deleted

## 2022-12-06 NOTE — Telephone Encounter (Signed)
Per note " We will change Emgality to Ajovy.  "

## 2022-12-22 ENCOUNTER — Emergency Department (HOSPITAL_COMMUNITY): Payer: 59

## 2022-12-22 ENCOUNTER — Inpatient Hospital Stay (HOSPITAL_COMMUNITY)
Admission: EM | Admit: 2022-12-22 | Discharge: 2022-12-29 | DRG: 871 | Disposition: A | Discharge status: Skilled Nursing Facility | Payer: 59 | Attending: Internal Medicine | Admitting: Internal Medicine

## 2022-12-22 ENCOUNTER — Encounter (HOSPITAL_COMMUNITY): Payer: Self-pay

## 2022-12-22 DIAGNOSIS — E11641 Type 2 diabetes mellitus with hypoglycemia with coma: Secondary | ICD-10-CM

## 2022-12-22 DIAGNOSIS — R7881 Bacteremia: Secondary | ICD-10-CM | POA: Diagnosis present

## 2022-12-22 DIAGNOSIS — Z794 Long term (current) use of insulin: Secondary | ICD-10-CM

## 2022-12-22 DIAGNOSIS — E876 Hypokalemia: Secondary | ICD-10-CM | POA: Diagnosis not present

## 2022-12-22 DIAGNOSIS — N1832 Chronic kidney disease, stage 3b: Secondary | ICD-10-CM | POA: Diagnosis present

## 2022-12-22 DIAGNOSIS — J690 Pneumonitis due to inhalation of food and vomit: Secondary | ICD-10-CM | POA: Diagnosis present

## 2022-12-22 DIAGNOSIS — Z981 Arthrodesis status: Secondary | ICD-10-CM

## 2022-12-22 DIAGNOSIS — Z7984 Long term (current) use of oral hypoglycemic drugs: Secondary | ICD-10-CM

## 2022-12-22 DIAGNOSIS — E119 Type 2 diabetes mellitus without complications: Secondary | ICD-10-CM

## 2022-12-22 DIAGNOSIS — R4701 Aphasia: Secondary | ICD-10-CM | POA: Diagnosis present

## 2022-12-22 DIAGNOSIS — T40605A Adverse effect of unspecified narcotics, initial encounter: Secondary | ICD-10-CM | POA: Diagnosis present

## 2022-12-22 DIAGNOSIS — Z6831 Body mass index (BMI) 31.0-31.9, adult: Secondary | ICD-10-CM

## 2022-12-22 DIAGNOSIS — G9341 Metabolic encephalopathy: Secondary | ICD-10-CM | POA: Diagnosis present

## 2022-12-22 DIAGNOSIS — A419 Sepsis, unspecified organism: Principal | ICD-10-CM | POA: Diagnosis present

## 2022-12-22 DIAGNOSIS — Z882 Allergy status to sulfonamides status: Secondary | ICD-10-CM

## 2022-12-22 DIAGNOSIS — Z79899 Other long term (current) drug therapy: Secondary | ICD-10-CM

## 2022-12-22 DIAGNOSIS — Z8249 Family history of ischemic heart disease and other diseases of the circulatory system: Secondary | ICD-10-CM

## 2022-12-22 DIAGNOSIS — E669 Obesity, unspecified: Secondary | ICD-10-CM | POA: Diagnosis present

## 2022-12-22 DIAGNOSIS — E1122 Type 2 diabetes mellitus with diabetic chronic kidney disease: Secondary | ICD-10-CM | POA: Diagnosis present

## 2022-12-22 DIAGNOSIS — Z880 Allergy status to penicillin: Secondary | ICD-10-CM

## 2022-12-22 DIAGNOSIS — R7989 Other specified abnormal findings of blood chemistry: Secondary | ICD-10-CM | POA: Insufficient documentation

## 2022-12-22 DIAGNOSIS — J189 Pneumonia, unspecified organism: Secondary | ICD-10-CM | POA: Diagnosis present

## 2022-12-22 DIAGNOSIS — R4182 Altered mental status, unspecified: Principal | ICD-10-CM

## 2022-12-22 DIAGNOSIS — R6520 Severe sepsis without septic shock: Secondary | ICD-10-CM | POA: Insufficient documentation

## 2022-12-22 DIAGNOSIS — I129 Hypertensive chronic kidney disease with stage 1 through stage 4 chronic kidney disease, or unspecified chronic kidney disease: Secondary | ICD-10-CM | POA: Diagnosis present

## 2022-12-22 DIAGNOSIS — Z9049 Acquired absence of other specified parts of digestive tract: Secondary | ICD-10-CM

## 2022-12-22 DIAGNOSIS — M5416 Radiculopathy, lumbar region: Secondary | ICD-10-CM | POA: Diagnosis present

## 2022-12-22 DIAGNOSIS — N179 Acute kidney failure, unspecified: Secondary | ICD-10-CM | POA: Diagnosis present

## 2022-12-22 DIAGNOSIS — G894 Chronic pain syndrome: Secondary | ICD-10-CM | POA: Diagnosis present

## 2022-12-22 DIAGNOSIS — E872 Acidosis, unspecified: Secondary | ICD-10-CM | POA: Diagnosis present

## 2022-12-22 DIAGNOSIS — E11649 Type 2 diabetes mellitus with hypoglycemia without coma: Secondary | ICD-10-CM | POA: Diagnosis present

## 2022-12-22 DIAGNOSIS — I10 Essential (primary) hypertension: Secondary | ICD-10-CM | POA: Diagnosis present

## 2022-12-22 DIAGNOSIS — G43909 Migraine, unspecified, not intractable, without status migrainosus: Secondary | ICD-10-CM | POA: Diagnosis present

## 2022-12-22 DIAGNOSIS — Z886 Allergy status to analgesic agent status: Secondary | ICD-10-CM

## 2022-12-22 DIAGNOSIS — J45909 Unspecified asthma, uncomplicated: Secondary | ICD-10-CM | POA: Diagnosis present

## 2022-12-22 DIAGNOSIS — Z79891 Long term (current) use of opiate analgesic: Secondary | ICD-10-CM

## 2022-12-22 DIAGNOSIS — E039 Hypothyroidism, unspecified: Secondary | ICD-10-CM | POA: Diagnosis present

## 2022-12-22 DIAGNOSIS — Z803 Family history of malignant neoplasm of breast: Secondary | ICD-10-CM

## 2022-12-22 DIAGNOSIS — Z90710 Acquired absence of both cervix and uterus: Secondary | ICD-10-CM

## 2022-12-22 DIAGNOSIS — G9340 Encephalopathy, unspecified: Secondary | ICD-10-CM | POA: Diagnosis present

## 2022-12-22 DIAGNOSIS — Z885 Allergy status to narcotic agent status: Secondary | ICD-10-CM

## 2022-12-22 DIAGNOSIS — K219 Gastro-esophageal reflux disease without esophagitis: Secondary | ICD-10-CM | POA: Diagnosis present

## 2022-12-22 DIAGNOSIS — Z79890 Hormone replacement therapy (postmenopausal): Secondary | ICD-10-CM

## 2022-12-22 DIAGNOSIS — Z7951 Long term (current) use of inhaled steroids: Secondary | ICD-10-CM

## 2022-12-22 DIAGNOSIS — E162 Hypoglycemia, unspecified: Secondary | ICD-10-CM

## 2022-12-22 LAB — URINALYSIS, W/ REFLEX TO CULTURE (INFECTION SUSPECTED)
Bilirubin Urine: NEGATIVE
Glucose, UA: 50 mg/dL — AB
Ketones, ur: NEGATIVE mg/dL
Leukocytes,Ua: NEGATIVE
Nitrite: NEGATIVE
Protein, ur: >=300 mg/dL — AB
Specific Gravity, Urine: 1.025 (ref 1.005–1.030)
pH: 5 (ref 5.0–8.0)

## 2022-12-22 LAB — CBG MONITORING, ED
Glucose-Capillary: 123 mg/dL — ABNORMAL HIGH (ref 70–99)
Glucose-Capillary: 119 mg/dL — ABNORMAL HIGH (ref 70–99)
Glucose-Capillary: 71 mg/dL (ref 70–99)
Glucose-Capillary: 133 mg/dL — ABNORMAL HIGH (ref 70–99)
Glucose-Capillary: 114 mg/dL — ABNORMAL HIGH (ref 70–99)
Glucose-Capillary: 118 mg/dL — ABNORMAL HIGH (ref 70–99)

## 2022-12-22 LAB — COMPREHENSIVE METABOLIC PANEL
ALT: 17 U/L (ref 0–44)
AST: 25 U/L (ref 15–41)
Albumin: 3.4 g/dL — ABNORMAL LOW (ref 3.5–5.0)
Alkaline Phosphatase: 53 U/L (ref 38–126)
Anion gap: 13 (ref 5–15)
BUN: 25 mg/dL — ABNORMAL HIGH (ref 8–23)
CO2: 26 mmol/L (ref 22–32)
Calcium: 9.5 mg/dL (ref 8.9–10.3)
Chloride: 96 mmol/L — ABNORMAL LOW (ref 98–111)
Creatinine, Ser: 1.73 mg/dL — ABNORMAL HIGH (ref 0.44–1.00)
GFR, Estimated: 32 mL/min — ABNORMAL LOW (ref >60)
Glucose, Bld: 185 mg/dL — ABNORMAL HIGH (ref 70–99)
Potassium: 3.1 mmol/L — ABNORMAL LOW (ref 3.5–5.1)
Sodium: 135 mmol/L (ref 135–145)
Total Bilirubin: 0.7 mg/dL (ref <1.2)
Total Protein: 6.5 g/dL (ref 6.5–8.1)

## 2022-12-22 LAB — I-STAT VENOUS BLOOD GAS, ED
Acid-Base Excess: 2 mmol/L (ref 0.0–2.0)
Bicarbonate: 28.3 mmol/L — ABNORMAL HIGH (ref 20.0–28.0)
Calcium, Ion: 1.15 mmol/L (ref 1.15–1.40)
HCT: 40 % (ref 36.0–46.0)
Hemoglobin: 13.6 g/dL (ref 12.0–15.0)
O2 Saturation: 33 %
Potassium: 3.2 mmol/L — ABNORMAL LOW (ref 3.5–5.1)
Sodium: 136 mmol/L (ref 135–145)
TCO2: 30 mmol/L (ref 22–32)
pCO2, Ven: 50.8 mm[Hg] (ref 44–60)
pH, Ven: 7.353 (ref 7.25–7.43)
pO2, Ven: 22 mm[Hg] — CL (ref 32–45)

## 2022-12-22 LAB — I-STAT CG4 LACTIC ACID, ED
Lactic Acid, Venous: 2.8 mmol/L (ref 0.5–1.9)
Lactic Acid, Venous: 3.1 mmol/L (ref 0.5–1.9)

## 2022-12-22 LAB — CBC WITH DIFFERENTIAL/PLATELET
Abs Immature Granulocytes: 0.03 10*3/uL (ref 0.00–0.07)
Basophils Absolute: 0 10*3/uL (ref 0.0–0.1)
Basophils Relative: 0 %
Eosinophils Absolute: 0 10*3/uL (ref 0.0–0.5)
Eosinophils Relative: 0 %
HCT: 39 % (ref 36.0–46.0)
Hemoglobin: 11.9 g/dL — ABNORMAL LOW (ref 12.0–15.0)
Immature Granulocytes: 0 %
Lymphocytes Relative: 13 %
Lymphs Abs: 1.1 10*3/uL (ref 0.7–4.0)
MCH: 27.5 pg (ref 26.0–34.0)
MCHC: 30.5 g/dL (ref 30.0–36.0)
MCV: 90.3 fL (ref 80.0–100.0)
Monocytes Absolute: 0.4 10*3/uL (ref 0.1–1.0)
Monocytes Relative: 5 %
Neutro Abs: 6.9 10*3/uL (ref 1.7–7.7)
Neutrophils Relative %: 82 %
Platelets: 309 10*3/uL (ref 150–400)
RBC: 4.32 MIL/uL (ref 3.87–5.11)
RDW: 14.2 % (ref 11.5–15.5)
WBC: 8.4 10*3/uL (ref 4.0–10.5)
nRBC: 0 % (ref 0.0–0.2)

## 2022-12-22 LAB — BETA-HYDROXYBUTYRIC ACID: Beta-Hydroxybutyric Acid: 0.91 mmol/L — ABNORMAL HIGH (ref 0.05–0.27)

## 2022-12-22 LAB — RAPID URINE DRUG SCREEN, HOSP PERFORMED
Amphetamines: NOT DETECTED
Barbiturates: NOT DETECTED
Benzodiazepines: NOT DETECTED
Cocaine: NOT DETECTED
Opiates: NOT DETECTED
Tetrahydrocannabinol: NOT DETECTED

## 2022-12-22 LAB — AMMONIA: Ammonia: <10 umol/L (ref 9–35)

## 2022-12-22 LAB — TROPONIN I (HIGH SENSITIVITY)
Troponin I (High Sensitivity): 49 ng/L — ABNORMAL HIGH (ref <18)
Troponin I (High Sensitivity): 64 ng/L — ABNORMAL HIGH (ref <18)

## 2022-12-22 LAB — ETHANOL: Alcohol, Ethyl (B): <10 mg/dL (ref <10)

## 2022-12-22 LAB — OSMOLALITY: Osmolality: 312 mosm/kg — ABNORMAL HIGH (ref 275–295)

## 2022-12-22 LAB — T4, FREE: Free T4: 0.9 ng/dL (ref 0.61–1.12)

## 2022-12-22 LAB — LIPASE, BLOOD: Lipase: 33 U/L (ref 11–51)

## 2022-12-22 LAB — TSH: TSH: 2.577 u[IU]/mL (ref 0.350–4.500)

## 2022-12-22 MED ORDER — LACTATED RINGERS IV BOLUS
1000.0000 mL | Freq: Once | INTRAVENOUS | Status: AC
  Administered 2022-12-22: 1000 mL via INTRAVENOUS

## 2022-12-22 MED ORDER — ACETAMINOPHEN 650 MG RE SUPP
650.0000 mg | Freq: Once | RECTAL | Status: AC
  Administered 2022-12-22: 650 mg via RECTAL
  Filled 2022-12-22: qty 1

## 2022-12-22 MED ORDER — DEXTROSE 10 % IV SOLN: INTRAVENOUS | Status: DC

## 2022-12-22 MED ORDER — HALOPERIDOL LACTATE 5 MG/ML IJ SOLN
5.0000 mg | Freq: Once | INTRAMUSCULAR | Status: AC
  Administered 2022-12-22: 5 mg via INTRAVENOUS
  Filled 2022-12-22: qty 1

## 2022-12-22 MED ORDER — DEXTROSE 50 % IV SOLN
1.0000 | Freq: Once | INTRAVENOUS | Status: AC
  Administered 2022-12-22: 50 mL via INTRAVENOUS
  Filled 2022-12-22: qty 50

## 2022-12-22 MED ORDER — SODIUM CHLORIDE 0.9 % IV SOLN
2.0000 g | Freq: Once | INTRAVENOUS | Status: AC
  Administered 2022-12-22: 2 g via INTRAVENOUS
  Filled 2022-12-22: qty 20

## 2022-12-22 NOTE — ED Notes (Signed)
ED TO INPATIENT HANDOFF REPORT  ED Nurse Name and Phone #: Jeanice Lim [phone]  S Name/Age/Gender Brianna Mitchell 67 y.o. female Room/Bed: 021C/021C  Code Status   Code Status: Prior  Home/SNF/Other Home Patient oriented to: self Is this baseline? No   Triage Complete: Triage complete  Chief Complaint Sepsis due to pneumonia (HCC) [J18.9, A41.9]  Triage Note Pt bib ems ems from home; called out for AMS; cbg 34; FD bagged pt for 10 minutes, npa in place; 250 bag D10 given pta; CBG 201, stared having purposeful movement; BP 192/110; fruity smell reported by ems; not following commands or answering questions; HR 110, 98% RA; last cbg 182; suctions some vomit from mouth; no hx etoh, reported soda drinker    Allergies Allergies  Allergen Reactions   Aspirin Swelling   Sulfa Antibiotics Swelling   Demerol Rash   Penicillins Rash and Other (See Comments)    Has patient had a PCN reaction causing immediate rash, facial/tongue/throat swelling, SOB or lightheadedness with hypotension: No Has patient had a PCN reaction causing severe rash involving mucus membranes or skin necrosis: No Has patient had a PCN reaction that required hospitalization: Yes Has patient had a PCN reaction occurring within the last 10 years: No  If all of the above answers are "NO", then may proceed with Cephalosporin use.     Level of Care/Admitting Diagnosis ED Disposition     ED Disposition  Admit   Condition  --   Comment  Hospital Area: [facility] [[id number]]  Level of Care: Progressive [102]  Admit to Progressive based on following criteria: NEUROLOGICAL AND NEUROSURGICAL complex patients with significant risk of instability, who do not meet ICU criteria, yet require close observation or frequent assessment (< / = every 2 - 4 hours) with medical / nursing intervention.  Admit to Progressive based on following criteria: MULTISYSTEM THREATS such as stable sepsis, metabolic/electrolyte  imbalance with or without encephalopathy that is responding to early treatment.  May place patient in observation at [facility] or [facility] if equivalent level of care is available:: No  Covid Evaluation: Asymptomatic - no recent exposure (last 10 days) testing not required  Diagnosis: Sepsis due to pneumonia [facility]) [[id number]]  Admitting Physician: Hillary Bow [[id number]]  Attending Physician: Hillary Bow [[id number]]          B Medical/Surgery History Past Medical History:  Diagnosis Date   Anemia    Asthma    Back pain    02/2019 getting injections   Carpal tunnel syndrome    Diabetes mellitus    type 2    Fibroid tumor    Ganglion cyst    GERD (gastroesophageal reflux disease)    will see Dr. Kathrynn Running 04/2018   Headache(784.0)    Heart murmur    saw dr. Jacinto Halim (only sees if needed, not seen since 2011)   Hypertension    dr Jacinto Halim      Hypothyroidism    Shortness of breath    on exertion    Shoulder pain    Tendonitis    left shoulder biceps tendonitis, acromioclavicular osteoarthritis   Tennis elbow    Thyroid disease    Past Surgical History:  Procedure Laterality Date   ABDOMINAL HYSTERECTOMY     ANTERIOR LAT LUMBAR FUSION N/A 01/03/2013   Procedure: X LIF (Extreme Lateral Interbody Fusion) L3-L4,  (1 LEVEL) ;  Surgeon: Venita Lick, MD;  Location: [facility] OR;  Service: Orthopedics;  Laterality: N/A;  BACK SURGERY     x5   CARDIAC CATHETERIZATION     CHOLECYSTECTOMY     COLONOSCOPY  04/18/2017   Dr Charna Elizabeth ; [facility]    EYE SURGERY  05/23/2017   laser of right eye, to have left eye done on 06-13-17 - for cataracts    KNEE ARTHROSCOPY Right 04/2014   LAPAROSCOPY N/A 05/30/2017   Procedure: LAPAROSCOPY DIAGNOSTIC;  Surgeon: Gaynelle Adu, MD;  Location: [facility] ORS;  Service: General;  Laterality: N/A;   LEFT HEART CATHETERIZATION WITH CORONARY ANGIOGRAM N/A 04/27/2011   Procedure: LEFT HEART CATHETERIZATION WITH CORONARY ANGIOGRAM;  Surgeon: Pamella Pert, MD;  Location: [facility] CATH LAB;  Service: Cardiovascular;  Laterality: N/A;   pelvic bone removed     ROTATOR CUFF REPAIR Left    x3 surgeries; Dr Lorin Picket [facility]    SHOULDER ARTHROSCOPY WITH SUBACROMIAL DECOMPRESSION, ROTATOR CUFF REPAIR AND BICEP TENDON REPAIR Left 02/03/2016   Procedure: SHOULDER ARTHROSCOPY WITH SUBACROMIAL DECOMPRESSION, DEBRIDEMENT, BICEPS TENODESIS, DISTAL CLAVICLE EXCISION.;  Surgeon: Cammy Copa, MD;  Location: [facility] OR;  Service: Orthopedics;  Laterality: Left;   THYROID SURGERY     LUMP REMOVED   TUBAL LIGATION       A IV Location/Drains/Wounds Patient Lines/Drains/Airways Status     Active Line/Drains/Airways     Name Placement date Placement time Site Days   Peripheral IV 12/22/22 20 G Left Antecubital 12/22/22  1547  Antecubital  less than 1   Peripheral IV 12/22/22 20 G Right Antecubital 12/22/22  1633  Antecubital  less than 1   Incision (Closed) 02/03/16 Shoulder Left 02/03/16  1851  -- 2514   Incision (Closed) 05/30/17 Abdomen Other (Comment) 05/30/17  0839  -- 2032   Incision - 3 Ports Abdomen 1: Umbilicus;Superior 2: Left;Lower 3: Left;Upper 05/30/17  1610  -- 2032            Intake/Output Last 24 hours  Intake/Output Summary (Last 24 hours) at 12/22/2022 2247 Last data filed at 12/22/2022 2008 Gross per 24 hour  Intake 1100 ml  Output --  Net 1100 ml    Labs/Imaging Results for orders placed or performed during the hospital encounter of 12/22/22 (from the past 48 hour(s))  Ammonia     Status: None   Collection Time: 12/22/22  3:25 PM  Result Value Ref Range   Ammonia <10 9 - 35 umol/L    Comment: Performed at [facility], [address], [place], [place] [address]  CBG monitoring, ED     Status: Abnormal   Collection Time: 12/22/22  3:35 PM  Result Value Ref Range   Glucose-Capillary 123 (H) 70 - 99 mg/dL    Comment: Glucose reference range applies only to samples taken after fasting for at least 8  hours.  CBC with Differential     Status: Abnormal   Collection Time: 12/22/22  3:41 PM  Result Value Ref Range   WBC 8.4 4.0 - 10.5 K/uL   RBC 4.32 3.87 - 5.11 MIL/uL   Hemoglobin 11.9 (L) 12.0 - 15.0 g/dL   HCT 40.9 81.1 - 91.4 %   MCV 90.3 80.0 - 100.0 fL   MCH 27.5 26.0 - 34.0 pg   MCHC 30.5 30.0 - 36.0 g/dL   RDW 78.2 95.6 - 21.3 %   Platelets 309 150 - 400 K/uL   nRBC 0.0 0.0 - 0.2 %   Neutrophils Relative % 82 %   Neutro Abs 6.9 1.7 -  7.7 K/uL   Lymphocytes Relative 13 %   Lymphs Abs 1.1 0.7 - 4.0 K/uL   Monocytes Relative 5 %   Monocytes Absolute 0.4 0.1 - 1.0 K/uL   Eosinophils Relative 0 %   Eosinophils Absolute 0.0 0.0 - 0.5 K/uL   Basophils Relative 0 %   Basophils Absolute 0.0 0.0 - 0.1 K/uL   Immature Granulocytes 0 %   Abs Immature Granulocytes 0.03 0.00 - 0.07 K/uL    Comment: Performed at [facility], [address] [place], [place] [address]  Comprehensive metabolic panel     Status: Abnormal   Collection Time: 12/22/22  3:41 PM  Result Value Ref Range   Sodium 135 135 - 145 mmol/L   Potassium 3.1 (L) 3.5 - 5.1 mmol/L   Chloride 96 (L) 98 - 111 mmol/L   CO2 26 22 - 32 mmol/L   Glucose, Bld 185 (H) 70 - 99 mg/dL    Comment: Glucose reference range applies only to samples taken after fasting for at least 8 hours.   BUN 25 (H) 8 - 23 mg/dL   Creatinine, Ser 6.04 (H) 0.44 - 1.00 mg/dL   Calcium 9.5 8.9 - 54.0 mg/dL   Total Protein 6.5 6.5 - 8.1 g/dL   Albumin 3.4 (L) 3.5 - 5.0 g/dL   AST 25 15 - 41 U/L   ALT 17 0 - 44 U/L   Alkaline Phosphatase 53 38 - 126 U/L   Total Bilirubin 0.7 <1.2 mg/dL   GFR, Estimated 32 (L) >60 mL/min    Comment: (NOTE) Calculated using the CKD-EPI Creatinine Equation (2021)    Anion gap 13 5 - 15    Comment: Performed at [facility] Lab, [address], [place], [place] [address]  Lipase, blood     Status: None   Collection Time: 12/22/22  3:41 PM  Result Value Ref Range   Lipase 33 11 - 51 U/L    Comment:  Performed at [facility], [address], [place], [place] [address]  Troponin I (High Sensitivity)     Status: Abnormal   Collection Time: 12/22/22  3:41 PM  Result Value Ref Range   Troponin I (High Sensitivity) 49 (H) <18 ng/L    Comment: (NOTE) Elevated high sensitivity troponin I (hsTnI) values and significant  changes across serial measurements may suggest ACS but many other  chronic and acute conditions are known to elevate hsTnI results.  Refer to the "Links" section for chest pain algorithms and additional  guidance. Performed at [facility], [address] [place], [place] [address]   Osmolality     Status: Abnormal   Collection Time: 12/22/22  3:41 PM  Result Value Ref Range   Osmolality 312 (H) 275 - 295 mOsm/kg    Comment: REPEATED TO VERIFY Performed at [facility], [address] [place], [place] [address]   Rapid urine drug screen (hospital performed)     Status: None   Collection Time: 12/22/22  3:54 PM  Result Value Ref Range   Opiates NONE DETECTED NONE DETECTED   Cocaine NONE DETECTED NONE DETECTED   Benzodiazepines NONE DETECTED NONE DETECTED   Amphetamines NONE DETECTED NONE DETECTED   Tetrahydrocannabinol NONE DETECTED NONE DETECTED   Barbiturates NONE DETECTED NONE DETECTED    Comment: (NOTE) DRUG SCREEN FOR MEDICAL PURPOSES ONLY.  IF CONFIRMATION IS NEEDED FOR ANY PURPOSE, NOTIFY LAB WITHIN 5 DAYS.  LOWEST DETECTABLE LIMITS FOR URINE DRUG  SCREEN Drug Class                     Cutoff (ng/mL) Amphetamine and metabolites    1000 Barbiturate and metabolites    200 Benzodiazepine                 200 Opiates and metabolites        300 Cocaine and metabolites        300 THC                            50 Performed at [facility] Lab, [address] [place], [place] [address]   Urinalysis, w/ Reflex to Culture (Infection Suspected) -Urine, Clean Catch     Status: Abnormal   Collection Time: 12/22/22  3:54 PM  Result Value  Ref Range   Specimen Source URINE, CLEAN CATCH    Color, Urine YELLOW YELLOW   APPearance HAZY (A) CLEAR   Specific Gravity, Urine 1.025 1.005 - 1.030   pH 5.0 5.0 - 8.0   Glucose, UA 50 (A) NEGATIVE mg/dL   Hgb urine dipstick SMALL (A) NEGATIVE   Bilirubin Urine NEGATIVE NEGATIVE   Ketones, ur NEGATIVE NEGATIVE mg/dL   Protein, ur >=528 (A) NEGATIVE mg/dL   Nitrite NEGATIVE NEGATIVE   Leukocytes,Ua NEGATIVE NEGATIVE   RBC / HPF 0-5 0 - 5 RBC/hpf   WBC, UA 0-5 0 - 5 WBC/hpf    Comment:        Reflex urine culture not performed if WBC <=10, OR if Squamous epithelial cells >5. If Squamous epithelial cells >5 suggest recollection.    Bacteria, UA RARE (A) NONE SEEN   Squamous Epithelial / HPF 0-5 0 - 5 /HPF   Mucus PRESENT    Hyaline Casts, UA PRESENT     Comment: Performed at [facility], [address], [place], [place] [address]  Beta-hydroxybutyric acid     Status: Abnormal   Collection Time: 12/22/22  3:55 PM  Result Value Ref Range   Beta-Hydroxybutyric Acid 0.91 (H) 0.05 - 0.27 mmol/L    Comment: Performed at [facility] Lab, [address], [place], [place] [address]  Ethanol     Status: None   Collection Time: 12/22/22  3:55 PM  Result Value Ref Range   Alcohol, Ethyl (B) <10 <10 mg/dL    Comment: (NOTE) Lowest detectable limit for serum alcohol is 10 mg/dL.  For medical purposes only. Performed at [facility], [address], [place], [place] [address]   I-Stat venous blood gas, [facility] ED, [facility], [facility])     Status: Abnormal   Collection Time: 12/22/22  4:02 PM  Result Value Ref Range   pH, Ven 7.353 7.25 - 7.43   pCO2, Ven 50.8 44 - 60 mmHg   pO2, Ven 22 (LL) 32 - 45 mmHg   Bicarbonate 28.3 (H) 20.0 - 28.0 mmol/L   TCO2 30 22 - 32 mmol/L   O2 Saturation 33 %   Acid-Base Excess 2.0 0.0 - 2.0 mmol/L   Sodium 136 135 - 145 mmol/L   Potassium 3.2 (L) 3.5 - 5.1 mmol/L   Calcium, Ion 1.15 1.15 - 1.40 mmol/L   HCT 40.0 36.0 - 46.0 %   Hemoglobin 13.6  12.0 - 15.0 g/dL   Sample type VENOUS    Comment NOTIFIED PHYSICIAN   I-Stat CG4 Lactic Acid     Status: Abnormal  Collection Time: 12/22/22  4:03 PM  Result Value Ref Range   Lactic Acid, Venous 2.8 (HH) 0.5 - 1.9 mmol/L   Comment NOTIFIED PHYSICIAN   TSH     Status: None   Collection Time: 12/22/22  4:26 PM  Result Value Ref Range   TSH 2.577 0.350 - 4.500 uIU/mL    Comment: Performed by a 3rd Generation assay with a functional sensitivity of <=0.01 uIU/mL. Performed at [facility] Lab, [address] [place], [place] [address]   T4, free     Status: None   Collection Time: 12/22/22  4:26 PM  Result Value Ref Range   Free T4 0.90 0.61 - 1.12 ng/dL    Comment: (NOTE) Biotin ingestion may interfere with free T4 tests. If the results are inconsistent with the TSH level, previous test results, or the clinical presentation, then consider biotin interference. If needed, order repeat testing after stopping biotin. Performed at [facility], [address], [place], [place] [address]   CBG monitoring, ED     Status: Abnormal   Collection Time: 12/22/22  4:30 PM  Result Value Ref Range   Glucose-Capillary 119 (H) 70 - 99 mg/dL    Comment: Glucose reference range applies only to samples taken after fasting for at least 8 hours.  POC CBG, ED     Status: None   Collection Time: 12/22/22  6:03 PM  Result Value Ref Range   Glucose-Capillary 71 70 - 99 mg/dL    Comment: Glucose reference range applies only to samples taken after fasting for at least 8 hours.  Troponin I (High Sensitivity)     Status: Abnormal   Collection Time: 12/22/22  7:00 PM  Result Value Ref Range   Troponin I (High Sensitivity) 64 (H) <18 ng/L    Comment: (NOTE) Elevated high sensitivity troponin I (hsTnI) values and significant  changes across serial measurements may suggest ACS but many other  chronic and acute conditions are known to elevate hsTnI results.  Refer to the "Links" section for chest  pain algorithms and additional  guidance. Performed at [facility] Lab, [address] [place], [place] [address]   POC CBG, ED     Status: Abnormal   Collection Time: 12/22/22  7:00 PM  Result Value Ref Range   Glucose-Capillary 133 (H) 70 - 99 mg/dL    Comment: Glucose reference range applies only to samples taken after fasting for at least 8 hours.  I-Stat CG4 Lactic Acid     Status: Abnormal   Collection Time: 12/22/22  7:07 PM  Result Value Ref Range   Lactic Acid, Venous 3.1 (HH) 0.5 - 1.9 mmol/L   Comment NOTIFIED PHYSICIAN   POC CBG, ED     Status: Abnormal   Collection Time: 12/22/22  8:14 PM  Result Value Ref Range   Glucose-Capillary 114 (H) 70 - 99 mg/dL    Comment: Glucose reference range applies only to samples taken after fasting for at least 8 hours.  POC CBG, ED     Status: Abnormal   Collection Time: 12/22/22  9:55 PM  Result Value Ref Range   Glucose-Capillary 118 (H) 70 - 99 mg/dL    Comment: Glucose reference range applies only to samples taken after fasting for at least 8 hours.   CT HEAD WO CONTRAST ( )  Result Date: 12/22/2022 CLINICAL DATA:  Delirium EXAM: CT HEAD WITHOUT CONTRAST TECHNIQUE: Contiguous axial images were obtained from the base of the skull  through the vertex without intravenous contrast. RADIATION DOSE REDUCTION: This exam was performed according to the departmental dose-optimization program which includes automated exposure control, adjustment of the mA and/or kV according to patient size and/or use of iterative reconstruction technique. COMPARISON:  11/21/2011 FINDINGS: Brain: No evidence of acute infarction, hemorrhage, mass, mass effect, or midline shift. No hydrocephalus or extra-axial fluid collection. Vascular: No hyperdense vessel. Skull: Negative for fracture or focal lesion. Sinuses/Orbits: No acute finding. Other: The mastoid air cells are well aerated. IMPRESSION: No acute intracranial process. Electronically Signed   By: Wiliam Ke M.D.   On: 12/22/2022 19:01   DG Chest Portable 1 View  Result Date: 12/22/2022 CLINICAL DATA:  Altered mental status EXAM: PORTABLE CHEST 1 VIEW COMPARISON:  11/01/2012 FINDINGS: Atherosclerotic calcification of the aortic arch. The hazy right perihilar opacity and associated airway thickening, cannot exclude bronchopneumonia or aspiration pneumonitis. The left lung appears clear. Borderline enlargement of the cardiopericardial silhouette. No acute bony findings. IMPRESSION: 1. Hazy right perihilar opacity and associated airway thickening, cannot exclude bronchopneumonia or aspiration pneumonitis. 2. Borderline enlargement of the cardiopericardial silhouette. 3. Aortic Atherosclerosis (ICD10-I70.0). Electronically Signed   By: Gaylyn Rong M.D.   On: 12/22/2022 18:43    Pending Labs Unresulted Labs (From admission, onward)     Start     Ordered   12/22/22 1626  T3, free  Once,   URGENT        12/22/22 1625   12/22/22 1541  Blood culture (routine x 2)  BLOOD CULTURE X 2,   R (with STAT occurrences)      12/22/22 1541            Vitals/Pain Today's Vitals   12/22/22 2045 12/22/22 2100 12/22/22 2115 12/22/22 2208  BP: (!) 161/86 (!) 160/82 (!) 166/76   Pulse:   93   Resp: 13 14 11    Temp:    100.2 F (37.9 C)  TempSrc:    Oral  SpO2:   97%     Isolation Precautions No active isolations  Medications Medications  dextrose 10 % infusion ( Intravenous New Bag/Given 12/22/22 2028)  lactated ringers bolus 1,000 mL (0 mLs Intravenous Stopped 12/22/22 1930)  haloperidol lactate (HALDOL) injection 5 mg (5 mg Intravenous Given 12/22/22 1650)  dextrose 50 % solution 50 mL (50 mLs Intravenous Given 12/22/22 1810)  cefTRIAXone (ROCEPHIN) 2 g in sodium chloride 0.9 % 100 mL IVPB (0 g Intravenous Stopped 12/22/22 2008)  acetaminophen (TYLENOL) suppository 650 mg (650 mg Rectal Given 12/22/22 1957)    Mobility non-ambulatory at this time. Pts mental status is not baseline and has  not gotten out of bed the duration of her stay in the ED. Her baseline is ambulatory with no assistance.      Focused Assessments Cardiac Assessment Handoff:    Lab Results  Component Value Date   CKTOTAL 107 12/02/2009   CKMB 1.3 12/02/2009   TROPONINI <0.01        NO INDICATION OF MYOCARDIAL INJURY. 12/02/2009   No results found for: "DDIMER" Does the Patient currently have chest pain? No   , Neuro Assessment Handoff:  Swallow screen pass? No          Neuro Assessment: Exceptions to WDL Neuro Checks:      Has TPA been given? No If patient is a Neuro Trauma and patient is going to OR before floor call report to 4N Charge nurse: [phone] or [phone]  , Pulmonary Assessment Handoff:  Lung  sounds:   O2 Device: Room Air O2 Flow Rate (L/min): 4 L/min    R Recommendations: See Admitting Provider Note  Report given to:   Additional Notes:

## 2022-12-22 NOTE — ED Notes (Signed)
Family at bedside. 

## 2022-12-22 NOTE — ED Notes (Signed)
Unable to get 2nd set of BLCs

## 2022-12-22 NOTE — ED Provider Notes (Signed)
[place] EMERGENCY DEPARTMENT AT [facility] Provider Note   CSN: [id number] Arrival date & time: 12/22/22  1531    History  Altered mental status   Brianna Mitchell is a 67 y.o. female history of diabetes, heart murmur, hypertension, chronic pain on opioid pain management here for evaluation of altered mental status.  EMS called out her blood sugar was found to be 34.  She is being bagged by fire.  She had no pinpoint pupils so was not given Narcan by EMS.  Was given D10 with increasing her blood sugar when she began to start having purposeful movement however on arrival to the ED still confused.  Was noted to have elevated blood pressure with EMS.  Had some emesis in her mouth.  No history of EtOH use.  HPI    Home Medications Prior to Admission medications   Medication Sig Start Date End Date Taking? Authorizing Provider  albuterol (PROVENTIL) (2.5 MG/3ML) 0.083% nebulizer solution Take 3 mLs (2.5 mg total) by nebulization every 6 (six) hours as needed for wheezing or shortness of breath. 05/20/20   Arnette Felts, FNP  albuterol (VENTOLIN HFA) 108 (90 Base) MCG/ACT inhaler Inhale 2 puffs into the lungs every 6 (six) hours as needed for wheezing or shortness of breath. 08/08/20   Arnette Felts, FNP  amLODipine (NORVASC) 5 MG tablet Take 5 mg by mouth daily. 02/19/21   [provider]  Atogepant (QULIPTA) 60 MG TABS Take 1 tablet (60 mg total) by mouth daily. 04/06/22   Lomax, Amy, NP  beclomethasone (QVAR) 80 MCG/ACT inhaler Inhale 2 puffs into the lungs once for 1 dose. Patient taking differently: Inhale 2 puffs into the lungs daily as needed (shortness of breath). 05/20/20 12/22/22  Arnette Felts, FNP  dapagliflozin propanediol (FARXIGA) 10 MG TABS tablet Take 10 mg by mouth daily. 10/26/22   [provider]  desloratadine (CLARINEX) 5 MG tablet Take 1 tablet (5 mg total) by mouth daily. 05/20/20   Arnette Felts, FNP  dexlansoprazole (DEXILANT) 60 MG capsule Take 1  capsule (60 mg total) by mouth daily. 05/20/20   Arnette Felts, FNP  docusate sodium (COLACE) 100 MG capsule Take 100 mg by mouth 2 (two) times daily as needed. 12/02/22   [provider]  DULoxetine (CYMBALTA) 60 MG capsule Take 60 mg by mouth every morning. 12/15/22   [provider]  Erenumab-aooe (AIMOVIG) 140 MG/ML SOAJ Inject 140 mg into the skin every 30 (thirty) days.    [provider]  FARXIGA 5 MG TABS tablet Take 5 mg by mouth daily. 11/08/22   [provider]  fluticasone (FLOVENT HFA) 44 MCG/ACT inhaler INHALE ONE PUFF into THE lungs TWICE DAILY Patient taking differently: Inhale 1 puff into the lungs in the morning and at bedtime. 05/07/22   Arnette Felts, FNP  HUMALOG KWIKPEN 100 UNIT/ML KwikPen Inject 5-18 Units into the skin 3 (three) times daily. Based on sliding scale 70-100= 5 units, 101-150 = 7 units, 151-200 = 9 units, 201-250 = 11 units, 251-300 = 13 units, 301-350 = 15 units, > 350= 18 units Patient taking differently: Inject 8-12 Units into the skin 3 (three) times daily. 05/20/20   Arnette Felts, FNP  HUMULIN N KWIKPEN 100 UNIT/ML Kiwkpen Inject 45-55 Units into the skin See admin instructions. Inject 55 units SQ in the morning and inject 30 units SQ in the evening Patient taking differently: Inject 5 Units into the skin in the morning and at bedtime. 05/20/20  Arnette Felts, FNP  hydrocortisone cream 0.5 % Apply 1 application topically 2 (two) times daily. 05/20/20   Arnette Felts, FNP  Lactobacillus TABS Take 1 tablet by mouth daily. 07/03/18   Arnette Felts, FNP  levothyroxine (SYNTHROID) 75 MCG tablet Take 1 tablet (75 mcg total) by mouth daily before breakfast. 05/20/20   Arnette Felts, FNP  lidocaine (XYLOCAINE) 5 % ointment Apply 1 application. topically as needed for moderate pain.    [provider]  lisinopril (ZESTRIL) 10 MG tablet Take 10 mg by mouth daily. 04/10/21   [provider]  loratadine (CLARITIN) 10 MG tablet  Take 10 mg by mouth daily.    [provider]  metoCLOPramide (REGLAN) 10 MG tablet Take 10 mg by mouth 2 (two) times daily. 12/01/22   [provider]  moxifloxacin (VIGAMOX) 0.5 % ophthalmic solution Place 1 drop into the left eye in the morning, at noon, in the evening, and at bedtime.    [provider]  NARCAN 4 MG/0.1ML LIQD nasal spray kit Place 0.4 mg into the nose once. 12/03/22   [provider]  ondansetron (ZOFRAN) 4 MG tablet Take 4 mg by mouth every 8 (eight) hours as needed for nausea or vomiting.    [provider]  oxyCODONE-acetaminophen (PERCOCET) 10-325 MG tablet Take 1 tablet by mouth every 6 (six) hours as needed for pain.    [provider]  oxymorphone (OPANA ER) 30 MG 12 hr tablet Take 30 mg by mouth every 12 (twelve) hours.  11/02/16   [provider]  polyethylene glycol (MIRALAX / GLYCOLAX) packet Take 17 g by mouth daily. Patient taking differently: Take 17 g by mouth daily as needed for mild constipation. 01/05/13   Naida Sleight, PA-C  rizatriptan (MAXALT-MLT) 10 MG disintegrating tablet Take 1 tablet (10 mg total) by mouth as needed for migraine. May repeat in 2 hours if needed 03/23/22   Lomax, Amy, NP  rosuvastatin (CRESTOR) 40 MG tablet TAKE ONE TABLET BY MOUTH EVERY DAY 07/22/21   Arnette Felts, FNP  tiZANidine (ZANAFLEX) 4 MG tablet Take 4 mg by mouth 2 (two) times daily as needed. 12/01/22   [provider]  topiramate (TOPAMAX) 100 MG tablet Take 1 tablet (100 mg total) by mouth 2 (two) times daily. 03/16/22   Lomax, Amy, NP      Allergies    Aspirin, Sulfa antibiotics, Demerol, and Penicillins    Review of Systems   Review of Systems  Unable to perform ROS: Mental status change  All other systems reviewed and are negative.   Physical Exam Updated Vital Signs BP (!) 166/76   Pulse 93   Temp 100.2 F (37.9 C) (Oral)   Resp 11   SpO2 97%  Physical Exam Vitals and nursing note  reviewed.  Constitutional:      Appearance: She is well-developed.     Comments: Confused, nontoxic-appearing  HENT:     Head: Normocephalic and atraumatic.     Nose: Nose normal.     Mouth/Throat:     Mouth: Mucous membranes are moist.  Eyes:     Pupils: Pupils are equal, round, and reactive to light.     Comments: PERRLA  Neck:     Comments: Spontaneous moves neck without difficulty Cardiovascular:     Rate and Rhythm: Tachycardia present.     Pulses: Normal pulses.     Heart sounds: Normal heart sounds.     Comments: Tachycardic Pulmonary:  Effort: Pulmonary effort is normal. No respiratory distress.     Breath sounds: Normal breath sounds.     Comments: Clear bilaterally Abdominal:     General: Bowel sounds are normal. There is no distension.     Palpations: Abdomen is soft.     Tenderness: There is no abdominal tenderness. There is no guarding or rebound.     Comments: Soft, nontender  Musculoskeletal:     Cervical back: Normal range of motion.     Comments: Spontaneously moves all 4 extremities  Skin:    General: Skin is warm and dry.     Capillary Refill: Capillary refill takes less than 2 seconds.     Comments: No obvious rashes or lesions on exposed skin  Neurological:     Mental Status: She is disoriented.     GCS: GCS eye subscore is 4. GCS verbal subscore is 1. GCS motor subscore is 5.     Comments: Confused, does not follow commands No obvious facial droop  Psychiatric:        Mood and Affect: Mood normal.    ED Results / Procedures / Treatments   Labs (all labs ordered are listed, but only abnormal results are displayed) Labs Reviewed  CBC WITH DIFFERENTIAL/PLATELET - Abnormal; Notable for the following components:      Result Value   Hemoglobin 11.9 (*)    All other components within normal limits  COMPREHENSIVE METABOLIC PANEL - Abnormal; Notable for the following components:   Potassium 3.1 (*)    Chloride 96 (*)    Glucose, Bld 185 (*)     BUN 25 (*)    Creatinine, Ser 1.73 (*)    Albumin 3.4 (*)    GFR, Estimated 32 (*)    All other components within normal limits  OSMOLALITY - Abnormal; Notable for the following components:   Osmolality 312 (*)    All other components within normal limits  BETA-HYDROXYBUTYRIC ACID - Abnormal; Notable for the following components:   Beta-Hydroxybutyric Acid 0.91 (*)    All other components within normal limits  URINALYSIS, W/ REFLEX TO CULTURE (INFECTION SUSPECTED) - Abnormal; Notable for the following components:   APPearance HAZY (*)    Glucose, UA 50 (*)    Hgb urine dipstick SMALL (*)    Protein, ur >=300 (*)    Bacteria, UA RARE (*)    All other components within normal limits  CBG MONITORING, ED - Abnormal; Notable for the following components:   Glucose-Capillary 123 (*)    All other components within normal limits  I-STAT CG4 LACTIC ACID, ED - Abnormal; Notable for the following components:   Lactic Acid, Venous 2.8 (*)    All other components within normal limits  I-STAT VENOUS BLOOD GAS, ED - Abnormal; Notable for the following components:   pO2, Ven 22 (*)    Bicarbonate 28.3 (*)    Potassium 3.2 (*)    All other components within normal limits  CBG MONITORING, ED - Abnormal; Notable for the following components:   Glucose-Capillary 119 (*)    All other components within normal limits  I-STAT CG4 LACTIC ACID, ED - Abnormal; Notable for the following components:   Lactic Acid, Venous 3.1 (*)    All other components within normal limits  CBG MONITORING, ED - Abnormal; Notable for the following components:   Glucose-Capillary 133 (*)    All other components within normal limits  CBG MONITORING, ED - Abnormal; Notable for the following components:  Glucose-Capillary 114 (*)    All other components within normal limits  CBG MONITORING, ED - Abnormal; Notable for the following components:   Glucose-Capillary 118 (*)    All other components within normal limits   TROPONIN I (HIGH SENSITIVITY) - Abnormal; Notable for the following components:   Troponin I (High Sensitivity) 49 (*)    All other components within normal limits  TROPONIN I (HIGH SENSITIVITY) - Abnormal; Notable for the following components:   Troponin I (High Sensitivity) 64 (*)    All other components within normal limits  CULTURE, BLOOD (ROUTINE X 2)  CULTURE, BLOOD (ROUTINE X 2)  LIPASE, BLOOD  AMMONIA  ETHANOL  RAPID URINE DRUG SCREEN, HOSP PERFORMED  TSH  T4, FREE  T3, FREE  CBG MONITORING, ED    EKG EKG Interpretation Date/Time:  Wednesday December 22 2022 15:37:40 EST Ventricular Rate:  112 PR Interval:  156 QRS Duration:  87 QT Interval:  336 QTC Calculation: 459 R Axis:   64  Text Interpretation: Sinus tachycardia Multiple ventricular premature complexes Aberrant conduction of SV complex(es) Anteroseptal infarct, old Borderline repolarization abnormality Confirmed by Gloris Manchester [phone]) on 12/22/2022 4:27:14 PM  Radiology CT HEAD WO CONTRAST ( )  Result Date: 12/22/2022 CLINICAL DATA:  Delirium EXAM: CT HEAD WITHOUT CONTRAST TECHNIQUE: Contiguous axial images were obtained from the base of the skull through the vertex without intravenous contrast. RADIATION DOSE REDUCTION: This exam was performed according to the departmental dose-optimization program which includes automated exposure control, adjustment of the mA and/or kV according to patient size and/or use of iterative reconstruction technique. COMPARISON:  11/21/2011 FINDINGS: Brain: No evidence of acute infarction, hemorrhage, mass, mass effect, or midline shift. No hydrocephalus or extra-axial fluid collection. Vascular: No hyperdense vessel. Skull: Negative for fracture or focal lesion. Sinuses/Orbits: No acute finding. Other: The mastoid air cells are well aerated. IMPRESSION: No acute intracranial process. Electronically Signed   By: Wiliam Ke M.D.   On: 12/22/2022 19:01   DG Chest Portable 1  View  Result Date: 12/22/2022 CLINICAL DATA:  Altered mental status EXAM: PORTABLE CHEST 1 VIEW COMPARISON:  11/01/2012 FINDINGS: Atherosclerotic calcification of the aortic arch. The hazy right perihilar opacity and associated airway thickening, cannot exclude bronchopneumonia or aspiration pneumonitis. The left lung appears clear. Borderline enlargement of the cardiopericardial silhouette. No acute bony findings. IMPRESSION: 1. Hazy right perihilar opacity and associated airway thickening, cannot exclude bronchopneumonia or aspiration pneumonitis. 2. Borderline enlargement of the cardiopericardial silhouette. 3. Aortic Atherosclerosis (ICD10-I70.0). Electronically Signed   By: Gaylyn Rong M.D.   On: 12/22/2022 18:43    Procedures .Critical Care  Performed by: Linwood Dibbles, PA-C Authorized by: Linwood Dibbles, PA-C   Critical care provider statement:    Critical care time (minutes):  35   Critical care was necessary to treat or prevent imminent or life-threatening deterioration of the following conditions:  Sepsis   Critical care was time spent personally by me on the following activities:  Development of treatment plan with patient or surrogate, discussions with consultants, evaluation of patient's response to treatment, examination of patient, ordering and review of laboratory studies, ordering and review of radiographic studies, ordering and performing treatments and interventions, pulse oximetry, re-evaluation of patient's condition and review of old charts     Medications Ordered in ED Medications  dextrose 10 % infusion ( Intravenous New Bag/Given 12/22/22 2028)  lactated ringers bolus 1,000 mL (0 mLs Intravenous Stopped 12/22/22 1930)  haloperidol lactate (HALDOL) injection 5 mg (5  mg Intravenous Given 12/22/22 1650)  dextrose 50 % solution 50 mL (50 mLs Intravenous Given 12/22/22 1810)  cefTRIAXone (ROCEPHIN) 2 g in sodium chloride 0.9 % 100 mL IVPB (0 g Intravenous Stopped  12/22/22 2008)  acetaminophen (TYLENOL) suppository 650 mg (650 mg Rectal Given 12/22/22 1957)    ED Course/ Medical Decision Making/ A&P   67 year old here for evaluation of altered mental status.  Noted by family prior to arrival, unknown last known well.  Arrival with EMS fire was bagging patient blood sugar was 34, given D10 with improvement in mentation however still not following commands in ED Cannot provider hx. No focal deficit to suspect CVA however does appear to have confusion, does not follow commands, she has no pinpoint pupils she has a normal respiratory status we will hold on Narcan as I have lower suspicion for opiate overdose however does have history of chronic opiate use for chronic pain.  Has no obvious infectious process on exam however did have emesis in her mouth when EMS arrived.   Daughter now at bedside. States friend came over to see her this afternoon and she was confused. LKN yesterday evening.  Patient more awake, speaks however has stutter and still seems confused.  Daughter states patient has never abused her opiate medications previously   On 4L however cannot get persistent accurate read per nursing due to nails. Patient takes on forehead and ear monitor. Orders written for soft restraints as she keeps removing monitoring equipment. Also will not hold for CT imaging. BP significantly elevated. Will order Haldol for imaging to r/o CVA.  Labs and imaging personally viewed and interpreted:  Lactic 2.8 VBG bicarb 28, ph 7.353 CBC without leukocytosis, hemoglobin 11.9 similar to baseline EKG sinus tachy, some PVC Ammonia < 10 UA negative for infection Trop 49 Lipase 33 Osmo 312 CMP potassium 3.1, creatinine 1.73, up from baseline   Blood sugar are downtrending.  Cannot tolerate p.o. intake due to altered mental status.  Will give amp of D50, will start on D10 drip she will need admission for persistent altered mental status, unclear etiology at this  time  Discussed with Pharmacy Christiane Ha with concern for aspiration PNA on chest xray, allergy to pen, sulfa. Rec Rocephin IV, patient has tolerated previously.   Developed a fever, suspect due to aspiration PNA. Given abx already. Will admit for further workup, meets SIRS criteria.  CONSULT Dr. Julian Reil with medicine who is agreeable to admit for further workup.  The patient appears reasonably stabilized for admission considering the current resources, flow, and capabilities available in the ED at this time, and I doubt any other [facility] requiring further screening and/or treatment in the ED prior to admission.                                Medical Decision Making Amount and/or Complexity of Data Reviewed Independent Historian: EMS External Data Reviewed: labs, radiology, ECG and notes. Labs: ordered. Decision-making details documented in ED Course. Radiology: ordered and independent interpretation performed. Decision-making details documented in ED Course. ECG/medicine tests: ordered and independent interpretation performed. Decision-making details documented in ED Course.  Risk OTC drugs. Prescription drug management. Parenteral controlled substances. Decision regarding hospitalization. Diagnosis or treatment significantly limited by social determinants of health.         Final Clinical Impression(s) / ED Diagnoses Final diagnoses:  Altered mental status, unspecified altered mental status type  Hypoglycemia  AKI (acute  kidney injury) (HCC)  Hypokalemia  Aspiration pneumonia of right lower lobe due to gastric secretions [facility])    Rx / DC Orders ED Discharge Orders     None         Jaden Abreu A, PA-C 12/22/22 2210    Gloris Manchester, MD 12/22/22 2325

## 2022-12-22 NOTE — ED Notes (Signed)
Informed [place], PA of the difficulties that previous shift had with obtaining second blood culture. [place], [place] states to proceed with ceftriaxone dose and not delay it waiting for second blood culture to be obtained.

## 2022-12-22 NOTE — ED Triage Notes (Addendum)
Pt bib ems ems from home; called out for AMS; cbg 34; FD bagged pt for 10 minutes, npa in place; 250 bag D10 given pta; CBG 201, stared having purposeful movement; BP 192/110; fruity smell reported by ems; not following commands or answering questions; HR 110, 98% RA; last cbg 182; suctions some vomit from mouth; no hx etoh, reported soda drinker

## 2022-12-22 NOTE — ED Notes (Signed)
Pt resting with eyes closed; respirations spontaneous, even, unlabored 

## 2022-12-23 ENCOUNTER — Observation Stay (HOSPITAL_COMMUNITY): Payer: 59

## 2022-12-23 ENCOUNTER — Other Ambulatory Visit: Payer: Self-pay

## 2022-12-23 DIAGNOSIS — G9340 Encephalopathy, unspecified: Secondary | ICD-10-CM

## 2022-12-23 DIAGNOSIS — R7881 Bacteremia: Secondary | ICD-10-CM | POA: Diagnosis present

## 2022-12-23 DIAGNOSIS — G43909 Migraine, unspecified, not intractable, without status migrainosus: Secondary | ICD-10-CM | POA: Diagnosis present

## 2022-12-23 DIAGNOSIS — E039 Hypothyroidism, unspecified: Secondary | ICD-10-CM | POA: Diagnosis present

## 2022-12-23 DIAGNOSIS — R6520 Severe sepsis without septic shock: Secondary | ICD-10-CM

## 2022-12-23 DIAGNOSIS — K219 Gastro-esophageal reflux disease without esophagitis: Secondary | ICD-10-CM | POA: Diagnosis present

## 2022-12-23 DIAGNOSIS — N1832 Chronic kidney disease, stage 3b: Secondary | ICD-10-CM | POA: Diagnosis present

## 2022-12-23 DIAGNOSIS — R569 Unspecified convulsions: Secondary | ICD-10-CM | POA: Diagnosis not present

## 2022-12-23 DIAGNOSIS — N179 Acute kidney failure, unspecified: Secondary | ICD-10-CM

## 2022-12-23 DIAGNOSIS — I10 Essential (primary) hypertension: Secondary | ICD-10-CM

## 2022-12-23 DIAGNOSIS — Z79891 Long term (current) use of opiate analgesic: Secondary | ICD-10-CM | POA: Diagnosis not present

## 2022-12-23 DIAGNOSIS — G9341 Metabolic encephalopathy: Secondary | ICD-10-CM | POA: Diagnosis present

## 2022-12-23 DIAGNOSIS — E162 Hypoglycemia, unspecified: Secondary | ICD-10-CM | POA: Diagnosis not present

## 2022-12-23 DIAGNOSIS — T40605A Adverse effect of unspecified narcotics, initial encounter: Secondary | ICD-10-CM | POA: Diagnosis present

## 2022-12-23 DIAGNOSIS — E11641 Type 2 diabetes mellitus with hypoglycemia with coma: Secondary | ICD-10-CM

## 2022-12-23 DIAGNOSIS — J690 Pneumonitis due to inhalation of food and vomit: Secondary | ICD-10-CM

## 2022-12-23 DIAGNOSIS — R7989 Other specified abnormal findings of blood chemistry: Secondary | ICD-10-CM | POA: Diagnosis present

## 2022-12-23 DIAGNOSIS — E872 Acidosis, unspecified: Secondary | ICD-10-CM | POA: Diagnosis present

## 2022-12-23 DIAGNOSIS — Z794 Long term (current) use of insulin: Secondary | ICD-10-CM | POA: Diagnosis not present

## 2022-12-23 DIAGNOSIS — E876 Hypokalemia: Secondary | ICD-10-CM | POA: Diagnosis present

## 2022-12-23 DIAGNOSIS — R4182 Altered mental status, unspecified: Secondary | ICD-10-CM

## 2022-12-23 DIAGNOSIS — J45909 Unspecified asthma, uncomplicated: Secondary | ICD-10-CM | POA: Diagnosis present

## 2022-12-23 DIAGNOSIS — R4701 Aphasia: Secondary | ICD-10-CM | POA: Diagnosis present

## 2022-12-23 DIAGNOSIS — A419 Sepsis, unspecified organism: Secondary | ICD-10-CM | POA: Diagnosis present

## 2022-12-23 DIAGNOSIS — E1122 Type 2 diabetes mellitus with diabetic chronic kidney disease: Secondary | ICD-10-CM | POA: Diagnosis present

## 2022-12-23 DIAGNOSIS — I129 Hypertensive chronic kidney disease with stage 1 through stage 4 chronic kidney disease, or unspecified chronic kidney disease: Secondary | ICD-10-CM | POA: Diagnosis present

## 2022-12-23 DIAGNOSIS — J189 Pneumonia, unspecified organism: Secondary | ICD-10-CM | POA: Diagnosis present

## 2022-12-23 DIAGNOSIS — G894 Chronic pain syndrome: Secondary | ICD-10-CM | POA: Diagnosis present

## 2022-12-23 DIAGNOSIS — E11649 Type 2 diabetes mellitus with hypoglycemia without coma: Secondary | ICD-10-CM | POA: Diagnosis present

## 2022-12-23 DIAGNOSIS — Z6831 Body mass index (BMI) 31.0-31.9, adult: Secondary | ICD-10-CM | POA: Diagnosis not present

## 2022-12-23 DIAGNOSIS — E669 Obesity, unspecified: Secondary | ICD-10-CM | POA: Diagnosis present

## 2022-12-23 LAB — CBG MONITORING, ED
Glucose-Capillary: 115 mg/dL — ABNORMAL HIGH (ref 70–99)
Glucose-Capillary: 129 mg/dL — ABNORMAL HIGH (ref 70–99)
Glucose-Capillary: 139 mg/dL — ABNORMAL HIGH (ref 70–99)
Glucose-Capillary: 141 mg/dL — ABNORMAL HIGH (ref 70–99)
Glucose-Capillary: 179 mg/dL — ABNORMAL HIGH (ref 70–99)
Glucose-Capillary: 181 mg/dL — ABNORMAL HIGH (ref 70–99)
Glucose-Capillary: 185 mg/dL — ABNORMAL HIGH (ref 70–99)
Glucose-Capillary: 187 mg/dL — ABNORMAL HIGH (ref 70–99)

## 2022-12-23 LAB — BASIC METABOLIC PANEL
Anion gap: 10 (ref 5–15)
BUN: 22 mg/dL (ref 8–23)
CO2: 24 mmol/L (ref 22–32)
Calcium: 9 mg/dL (ref 8.9–10.3)
Chloride: 98 mmol/L (ref 98–111)
Creatinine, Ser: 1.62 mg/dL — ABNORMAL HIGH (ref 0.44–1.00)
GFR, Estimated: 34 mL/min — ABNORMAL LOW (ref >60)
Glucose, Bld: 155 mg/dL — ABNORMAL HIGH (ref 70–99)
Potassium: 3.3 mmol/L — ABNORMAL LOW (ref 3.5–5.1)
Sodium: 132 mmol/L — ABNORMAL LOW (ref 135–145)

## 2022-12-23 LAB — T3, FREE: T3, Free: 2 pg/mL (ref 2.0–4.4)

## 2022-12-23 LAB — HIV ANTIBODY (ROUTINE TESTING W REFLEX): HIV Screen 4th Generation wRfx: NONREACTIVE

## 2022-12-23 LAB — CBC
HCT: 33.2 % — ABNORMAL LOW (ref 36.0–46.0)
Hemoglobin: 10.7 g/dL — ABNORMAL LOW (ref 12.0–15.0)
MCH: 28.2 pg (ref 26.0–34.0)
MCHC: 32.2 g/dL (ref 30.0–36.0)
MCV: 87.6 fL (ref 80.0–100.0)
Platelets: 271 10*3/uL (ref 150–400)
RBC: 3.79 MIL/uL — ABNORMAL LOW (ref 3.87–5.11)
RDW: 14.4 % (ref 11.5–15.5)
WBC: 14.2 10*3/uL — ABNORMAL HIGH (ref 4.0–10.5)
nRBC: 0 % (ref 0.0–0.2)

## 2022-12-23 LAB — GLUCOSE, CAPILLARY
Glucose-Capillary: 165 mg/dL — ABNORMAL HIGH (ref 70–99)
Glucose-Capillary: 225 mg/dL — ABNORMAL HIGH (ref 70–99)

## 2022-12-23 MED ORDER — POTASSIUM CHLORIDE 10 MEQ/100ML IV SOLN
10.0000 meq | INTRAVENOUS | Status: AC
  Administered 2022-12-23 (×4): 10 meq via INTRAVENOUS
  Filled 2022-12-23 (×4): qty 100

## 2022-12-23 MED ORDER — DEXTROSE 5 % IV SOLN
500.0000 mg | INTRAVENOUS | Status: DC
  Administered 2022-12-23: 500 mg via INTRAVENOUS
  Filled 2022-12-23: qty 5

## 2022-12-23 MED ORDER — TOPIRAMATE 25 MG PO TABS
100.0000 mg | ORAL_TABLET | Freq: Two times a day (BID) | ORAL | Status: DC
  Administered 2022-12-23 – 2022-12-29 (×13): 100 mg via ORAL
  Filled 2022-12-23 (×14): qty 4

## 2022-12-23 MED ORDER — POTASSIUM CHLORIDE 10 MEQ/100ML IV SOLN
10.0000 meq | INTRAVENOUS | Status: AC
  Administered 2022-12-23 (×2): 10 meq via INTRAVENOUS
  Filled 2022-12-23 (×2): qty 100

## 2022-12-23 MED ORDER — DEXTROSE IN LACTATED RINGERS 5 % IV SOLN: INTRAVENOUS | Status: AC

## 2022-12-23 MED ORDER — SODIUM CHLORIDE 0.9 % IV SOLN
2.0000 g | INTRAVENOUS | Status: AC
  Administered 2022-12-23 – 2022-12-27 (×5): 2 g via INTRAVENOUS
  Filled 2022-12-23 (×5): qty 20

## 2022-12-23 MED ORDER — ENOXAPARIN SODIUM 30 MG/0.3ML IJ SOSY
30.0000 mg | PREFILLED_SYRINGE | INTRAMUSCULAR | Status: DC
Start: 2022-12-23 — End: 2022-12-25
  Administered 2022-12-23 – 2022-12-25 (×3): 30 mg via SUBCUTANEOUS
  Filled 2022-12-23 (×3): qty 0.3

## 2022-12-23 MED ORDER — METRONIDAZOLE 500 MG/100ML IV SOLN
500.0000 mg | Freq: Two times a day (BID) | INTRAVENOUS | Status: AC
  Administered 2022-12-23 – 2022-12-27 (×10): 500 mg via INTRAVENOUS
  Filled 2022-12-23 (×10): qty 100

## 2022-12-23 MED ORDER — SODIUM CHLORIDE 0.9 % IV BOLUS
250.0000 mL | Freq: Once | INTRAVENOUS | Status: AC
  Administered 2022-12-23: 250 mL via INTRAVENOUS

## 2022-12-23 NOTE — ED Notes (Signed)
ED TO INPATIENT HANDOFF REPORT  ED Nurse Name and Phone #: Joneen Boers, Paramedic / [phone]  S Name/Age/Gender Brianna Mitchell 67 y.o. female Room/Bed: 021C/021C  Code Status   Code Status: Full Code  Home/SNF/Other Home Patient oriented to: self and place Is this baseline?  This Clinical research associate is unsure of PT's baseline and has had no family at bedside to answer questions regarding same.   Triage Complete: Triage complete  Chief Complaint Sepsis due to pneumonia (HCC) [J18.9, A41.9]  Triage Note Pt bib ems ems from home; called out for AMS; cbg 34; FD bagged pt for 10 minutes, npa in place; 250 bag D10 given pta; CBG 201, stared having purposeful movement; BP 192/110; fruity smell reported by ems; not following commands or answering questions; HR 110, 98% RA; last cbg 182; suctions some vomit from mouth; no hx etoh, reported soda drinker    Allergies Allergies  Allergen Reactions   Aspirin Swelling   Sulfa Antibiotics Swelling   Demerol Rash   Penicillins Rash and Other (See Comments)    Has patient had a PCN reaction causing immediate rash, facial/tongue/throat swelling, SOB or lightheadedness with hypotension: No Has patient had a PCN reaction causing severe rash involving mucus membranes or skin necrosis: No Has patient had a PCN reaction that required hospitalization: Yes Has patient had a PCN reaction occurring within the last 10 years: No  If all of the above answers are "NO", then may proceed with Cephalosporin use.     Level of Care/Admitting Diagnosis ED Disposition     ED Disposition  Admit   Condition  --   Comment  Hospital Area: [facility] [[id number]]  Level of Care: Progressive [102]  Admit to Progressive based on following criteria: NEUROLOGICAL AND NEUROSURGICAL complex patients with significant risk of instability, who do not meet ICU criteria, yet require close observation or frequent assessment (< / = every 2 - 4 hours) with medical  / nursing intervention.  Admit to Progressive based on following criteria: MULTISYSTEM THREATS such as stable sepsis, metabolic/electrolyte imbalance with or without encephalopathy that is responding to early treatment.  May admit patient to [facility] or [facility] if equivalent level of care is available:: No  Covid Evaluation: Asymptomatic - no recent exposure (last 10 days) testing not required  Diagnosis: Sepsis due to pneumonia [facility]) [[id number]]  Admitting Physician: Hillary Bow [[id number]]  Attending Physician: Standley Brooking [[id number]]  Certification:: I certify this patient will need inpatient services for at least 2 midnights  Expected Medical Readiness: 12/24/2022          B Medical/Surgery History Past Medical History:  Diagnosis Date   Anemia    Asthma    Back pain    02/2019 getting injections   Carpal tunnel syndrome    Diabetes mellitus    type 2    Fibroid tumor    Ganglion cyst    GERD (gastroesophageal reflux disease)    will see Dr. Kathrynn Running 04/2018   Headache(784.0)    Heart murmur    saw dr. Jacinto Halim (only sees if needed, not seen since 2011)   Hypertension    dr Jacinto Halim      Hypothyroidism    Shortness of breath    on exertion    Shoulder pain    Tendonitis    left shoulder biceps tendonitis, acromioclavicular osteoarthritis   Tennis elbow    Thyroid disease    Past Surgical History:  Procedure Laterality Date  ABDOMINAL HYSTERECTOMY     ANTERIOR LAT LUMBAR FUSION N/A 01/03/2013   Procedure: X LIF (Extreme Lateral Interbody Fusion) L3-L4,  (1 LEVEL) ;  Surgeon: Venita Lick, MD;  Location: [facility] OR;  Service: Orthopedics;  Laterality: N/A;   BACK SURGERY     x5   CARDIAC CATHETERIZATION     CHOLECYSTECTOMY     COLONOSCOPY  04/18/2017   Dr Charna Elizabeth ; [facility]    EYE SURGERY  05/23/2017   laser of right eye, to have left eye done on 06-13-17 - for cataracts    KNEE ARTHROSCOPY Right 04/2014   LAPAROSCOPY N/A 05/30/2017   Procedure:  LAPAROSCOPY DIAGNOSTIC;  Surgeon: Gaynelle Adu, MD;  Location: [facility] ORS;  Service: General;  Laterality: N/A;   LEFT HEART CATHETERIZATION WITH CORONARY ANGIOGRAM N/A 04/27/2011   Procedure: LEFT HEART CATHETERIZATION WITH CORONARY ANGIOGRAM;  Surgeon: Pamella Pert, MD;  Location: [facility] CATH LAB;  Service: Cardiovascular;  Laterality: N/A;   pelvic bone removed     ROTATOR CUFF REPAIR Left    x3 surgeries; Dr Lorin Picket [facility]    SHOULDER ARTHROSCOPY WITH SUBACROMIAL DECOMPRESSION, ROTATOR CUFF REPAIR AND BICEP TENDON REPAIR Left 02/03/2016   Procedure: SHOULDER ARTHROSCOPY WITH SUBACROMIAL DECOMPRESSION, DEBRIDEMENT, BICEPS TENODESIS, DISTAL CLAVICLE EXCISION.;  Surgeon: Cammy Copa, MD;  Location: [facility] OR;  Service: Orthopedics;  Laterality: Left;   THYROID SURGERY     LUMP REMOVED   TUBAL LIGATION       A IV Location/Drains/Wounds Patient Lines/Drains/Airways Status     Active Line/Drains/Airways     Name Placement date Placement time Site Days   Peripheral IV 12/22/22 20 G Left Antecubital 12/22/22  1547  Antecubital  1   Peripheral IV 12/22/22 20 G Right Antecubital 12/22/22  1633  Antecubital  1   Peripheral IV 12/23/22 20 G Anterior;Left Forearm 12/23/22  0038  Forearm  less than 1   Incision (Closed) 02/03/16 Shoulder Left 02/03/16  1851  -- 2515   Incision (Closed) 05/30/17 Abdomen Other (Comment) 05/30/17  0839  -- 2033   Incision - 3 Ports Abdomen 1: Umbilicus;Superior 2: Left;Lower 3: Left;Upper 05/30/17  7253  -- 2033            Intake/Output Last 24 hours  Intake/Output Summary (Last 24 hours) at 12/23/2022 1659 Last data filed at 12/23/2022 1311 Gross per 24 hour  Intake 3031.31 ml  Output --  Net 3031.31 ml    Labs/Imaging Results for orders placed or performed during the hospital encounter of 12/22/22 (from the past 48 hour(s))  Blood culture (routine x 2)     Status: None (Preliminary result)   Collection Time: 12/22/22  3:25 PM    Specimen: BLOOD  Result Value Ref Range   Specimen Description BLOOD RIGHT ANTECUBITAL    Special Requests      BOTTLES DRAWN AEROBIC AND ANAEROBIC Blood Culture adequate volume   Culture      NO GROWTH < 24 HOURS Performed at [facility] Lab, [address], [place], [place] [address]    Report Status PENDING   Ammonia     Status: None   Collection Time: 12/22/22  3:25 PM  Result Value Ref Range   Ammonia <10 9 - 35 umol/L    Comment: Performed at [facility], [address] [place], [place] [address]  CBG monitoring, ED     Status: Abnormal   Collection Time: 12/22/22  3:35 PM  Result Value Ref  Range   Glucose-Capillary 123 (H) 70 - 99 mg/dL    Comment: Glucose reference range applies only to samples taken after fasting for at least 8 hours.  CBC with Differential     Status: Abnormal   Collection Time: 12/22/22  3:41 PM  Result Value Ref Range   WBC 8.4 4.0 - 10.5 K/uL   RBC 4.32 3.87 - 5.11 MIL/uL   Hemoglobin 11.9 (L) 12.0 - 15.0 g/dL   HCT 13.0 86.5 - 78.4 %   MCV 90.3 80.0 - 100.0 fL   MCH 27.5 26.0 - 34.0 pg   MCHC 30.5 30.0 - 36.0 g/dL   RDW 69.6 29.5 - 28.4 %   Platelets 309 150 - 400 K/uL   nRBC 0.0 0.0 - 0.2 %   Neutrophils Relative % 82 %   Neutro Abs 6.9 1.7 - 7.7 K/uL   Lymphocytes Relative 13 %   Lymphs Abs 1.1 0.7 - 4.0 K/uL   Monocytes Relative 5 %   Monocytes Absolute 0.4 0.1 - 1.0 K/uL   Eosinophils Relative 0 %   Eosinophils Absolute 0.0 0.0 - 0.5 K/uL   Basophils Relative 0 %   Basophils Absolute 0.0 0.0 - 0.1 K/uL   Immature Granulocytes 0 %   Abs Immature Granulocytes 0.03 0.00 - 0.07 K/uL    Comment: Performed at [facility] Lab, [address], [place], [place] [address]  Comprehensive metabolic panel     Status: Abnormal   Collection Time: 12/22/22  3:41 PM  Result Value Ref Range   Sodium 135 135 - 145 mmol/L   Potassium 3.1 (L) 3.5 - 5.1 mmol/L   Chloride 96 (L) 98 - 111 mmol/L   CO2 26 22 - 32 mmol/L   Glucose, Bld 185  (H) 70 - 99 mg/dL    Comment: Glucose reference range applies only to samples taken after fasting for at least 8 hours.   BUN 25 (H) 8 - 23 mg/dL   Creatinine, Ser 0.10 (H) 0.44 - 1.00 mg/dL   Calcium 9.5 8.9 - 27.2 mg/dL   Total Protein 6.5 6.5 - 8.1 g/dL   Albumin 3.4 (L) 3.5 - 5.0 g/dL   AST 25 15 - 41 U/L   ALT 17 0 - 44 U/L   Alkaline Phosphatase 53 38 - 126 U/L   Total Bilirubin 0.7 <1.2 mg/dL   GFR, Estimated 32 (L) >60 mL/min    Comment: (NOTE) Calculated using the CKD-EPI Creatinine Equation (2021)    Anion gap 13 5 - 15    Comment: Performed at [facility] Lab, [address] [place], [place] [address]  Lipase, blood     Status: None   Collection Time: 12/22/22  3:41 PM  Result Value Ref Range   Lipase 33 11 - 51 U/L    Comment: Performed at [facility], [address] [place], [place] [address]  Troponin I (High Sensitivity)     Status: Abnormal   Collection Time: 12/22/22  3:41 PM  Result Value Ref Range   Troponin I (High Sensitivity) 49 (H) <18 ng/L    Comment: (NOTE) Elevated high sensitivity troponin I (hsTnI) values and significant  changes across serial measurements may suggest ACS but many other  chronic and acute conditions are known to elevate hsTnI results.  Refer to the "Links" section for chest pain algorithms and additional  guidance. Performed at [facility] Lab, [address], [place], [place] [address]   Osmolality  Status: Abnormal   Collection Time: 12/22/22  3:41 PM  Result Value Ref Range   Osmolality 312 (H) 275 - 295 mOsm/kg    Comment: REPEATED TO VERIFY Performed at [facility], [address], [place], [place] [address]   Rapid urine drug screen (hospital performed)     Status: None   Collection Time: 12/22/22  3:54 PM  Result Value Ref Range   Opiates NONE DETECTED NONE DETECTED   Cocaine NONE DETECTED NONE DETECTED   Benzodiazepines NONE DETECTED NONE DETECTED   Amphetamines NONE DETECTED NONE DETECTED    Tetrahydrocannabinol NONE DETECTED NONE DETECTED   Barbiturates NONE DETECTED NONE DETECTED    Comment: (NOTE) DRUG SCREEN FOR MEDICAL PURPOSES ONLY.  IF CONFIRMATION IS NEEDED FOR ANY PURPOSE, NOTIFY LAB WITHIN 5 DAYS.  LOWEST DETECTABLE LIMITS FOR URINE DRUG SCREEN Drug Class                     Cutoff (ng/mL) Amphetamine and metabolites    1000 Barbiturate and metabolites    200 Benzodiazepine                 200 Opiates and metabolites        300 Cocaine and metabolites        300 THC                            50 Performed at [facility] Lab, [address], [place], [place] [address]   Urinalysis, w/ Reflex to Culture (Infection Suspected) -Urine, Clean Catch     Status: Abnormal   Collection Time: 12/22/22  3:54 PM  Result Value Ref Range   Specimen Source URINE, CLEAN CATCH    Color, Urine YELLOW YELLOW   APPearance HAZY (A) CLEAR   Specific Gravity, Urine 1.025 1.005 - 1.030   pH 5.0 5.0 - 8.0   Glucose, UA 50 (A) NEGATIVE mg/dL   Hgb urine dipstick SMALL (A) NEGATIVE   Bilirubin Urine NEGATIVE NEGATIVE   Ketones, ur NEGATIVE NEGATIVE mg/dL   Protein, ur >=657 (A) NEGATIVE mg/dL   Nitrite NEGATIVE NEGATIVE   Leukocytes,Ua NEGATIVE NEGATIVE   RBC / HPF 0-5 0 - 5 RBC/hpf   WBC, UA 0-5 0 - 5 WBC/hpf    Comment:        Reflex urine culture not performed if WBC <=10, OR if Squamous epithelial cells >5. If Squamous epithelial cells >5 suggest recollection.    Bacteria, UA RARE (A) NONE SEEN   Squamous Epithelial / HPF 0-5 0 - 5 /HPF   Mucus PRESENT    Hyaline Casts, UA PRESENT     Comment: Performed at [facility] Lab, [address], [place], [place] [address]  Beta-hydroxybutyric acid     Status: Abnormal   Collection Time: 12/22/22  3:55 PM  Result Value Ref Range   Beta-Hydroxybutyric Acid 0.91 (H) 0.05 - 0.27 mmol/L    Comment: Performed at [facility] Lab, [address], [place], [place] [address]  Ethanol     Status: None   Collection Time:  12/22/22  3:55 PM  Result Value Ref Range   Alcohol, Ethyl (B) <10 <10 mg/dL    Comment: (NOTE) Lowest detectable limit for serum alcohol is 10 mg/dL.  For medical purposes only. Performed at [facility] Lab, [address], [place], [place] [address]   I-Stat venous blood gas, [facility] ED, [facility],  [facility])     Status: Abnormal   Collection Time: 12/22/22  4:02 PM  Result Value Ref Range   pH, Ven 7.353 7.25 - 7.43   pCO2, Ven 50.8 44 - 60 mmHg   pO2, Ven 22 (LL) 32 - 45 mmHg   Bicarbonate 28.3 (H) 20.0 - 28.0 mmol/L   TCO2 30 22 - 32 mmol/L   O2 Saturation 33 %   Acid-Base Excess 2.0 0.0 - 2.0 mmol/L   Sodium 136 135 - 145 mmol/L   Potassium 3.2 (L) 3.5 - 5.1 mmol/L   Calcium, Ion 1.15 1.15 - 1.40 mmol/L   HCT 40.0 36.0 - 46.0 %   Hemoglobin 13.6 12.0 - 15.0 g/dL   Sample type VENOUS    Comment NOTIFIED PHYSICIAN   I-Stat CG4 Lactic Acid     Status: Abnormal   Collection Time: 12/22/22  4:03 PM  Result Value Ref Range   Lactic Acid, Venous 2.8 (HH) 0.5 - 1.9 mmol/L   Comment NOTIFIED PHYSICIAN   TSH     Status: None   Collection Time: 12/22/22  4:26 PM  Result Value Ref Range   TSH 2.577 0.350 - 4.500 uIU/mL    Comment: Performed by a 3rd Generation assay with a functional sensitivity of <=0.01 uIU/mL. Performed at [facility] Lab, [address], [place], [place] [address]   T3, free     Status: None   Collection Time: 12/22/22  4:26 PM  Result Value Ref Range   T3, Free 2.0 2.0 - 4.4 pg/mL    Comment: (NOTE) Performed At: [facility] [address] [place], [place] [id number] Jolene Schimke MD [id number]   T4, free     Status: None   Collection Time: 12/22/22  4:26 PM  Result Value Ref Range   Free T4 0.90 0.61 - 1.12 ng/dL    Comment: (NOTE) Biotin ingestion may interfere with free T4 tests. If the results are inconsistent with the TSH level, previous test results, or the clinical presentation, then consider biotin interference. If needed, order  repeat testing after stopping biotin. Performed at [facility] Lab, [address] [place], [place] [address]   CBG monitoring, ED     Status: Abnormal   Collection Time: 12/22/22  4:30 PM  Result Value Ref Range   Glucose-Capillary 119 (H) 70 - 99 mg/dL    Comment: Glucose reference range applies only to samples taken after fasting for at least 8 hours.  POC CBG, ED     Status: None   Collection Time: 12/22/22  6:03 PM  Result Value Ref Range   Glucose-Capillary 71 70 - 99 mg/dL    Comment: Glucose reference range applies only to samples taken after fasting for at least 8 hours.  Troponin I (High Sensitivity)     Status: Abnormal   Collection Time: 12/22/22  7:00 PM  Result Value Ref Range   Troponin I (High Sensitivity) 64 (H) <18 ng/L    Comment: (NOTE) Elevated high sensitivity troponin I (hsTnI) values and significant  changes across serial measurements may suggest ACS but many other  chronic and acute conditions are known to elevate hsTnI results.  Refer to the "Links" section for chest pain algorithms and additional  guidance. Performed at [facility] Lab, [address] [place], [place] [address]   POC CBG, ED     Status: Abnormal   Collection Time: 12/22/22  7:00 PM  Result Value Ref Range   Glucose-Capillary 133 (H)  70 - 99 mg/dL    Comment: Glucose reference range applies only to samples taken after fasting for at least 8 hours.  I-Stat CG4 Lactic Acid     Status: Abnormal   Collection Time: 12/22/22  7:07 PM  Result Value Ref Range   Lactic Acid, Venous 3.1 (HH) 0.5 - 1.9 mmol/L   Comment NOTIFIED PHYSICIAN   POC CBG, ED     Status: Abnormal   Collection Time: 12/22/22  8:14 PM  Result Value Ref Range   Glucose-Capillary 114 (H) 70 - 99 mg/dL    Comment: Glucose reference range applies only to samples taken after fasting for at least 8 hours.  POC CBG, ED     Status: Abnormal   Collection Time: 12/22/22  9:55 PM  Result Value Ref Range    Glucose-Capillary 118 (H) 70 - 99 mg/dL    Comment: Glucose reference range applies only to samples taken after fasting for at least 8 hours.  CBG monitoring, ED     Status: Abnormal   Collection Time: 12/23/22 12:39 AM  Result Value Ref Range   Glucose-Capillary 187 (H) 70 - 99 mg/dL    Comment: Glucose reference range applies only to samples taken after fasting for at least 8 hours.  Blood culture (routine x 2)     Status: None (Preliminary result)   Collection Time: 12/23/22 12:41 AM   Specimen: BLOOD LEFT ARM  Result Value Ref Range   Specimen Description BLOOD LEFT ARM    Special Requests      BOTTLES DRAWN AEROBIC AND ANAEROBIC Blood Culture adequate volume   Culture      NO GROWTH < 12 HOURS Performed at [facility] Lab, [address] [place], [place] [address]    Report Status PENDING   HIV Antibody (routine testing w rflx)     Status: None   Collection Time: 12/23/22 12:41 AM  Result Value Ref Range   HIV Screen 4th Generation wRfx Non Reactive Non Reactive    Comment: Performed at [facility] Lab, [address], [place], [place] [address]  CBG monitoring, ED     Status: Abnormal   Collection Time: 12/23/22  2:36 AM  Result Value Ref Range   Glucose-Capillary 185 (H) 70 - 99 mg/dL    Comment: Glucose reference range applies only to samples taken after fasting for at least 8 hours.  CBG monitoring, ED     Status: Abnormal   Collection Time: 12/23/22  3:42 AM  Result Value Ref Range   Glucose-Capillary 179 (H) 70 - 99 mg/dL    Comment: Glucose reference range applies only to samples taken after fasting for at least 8 hours.  CBC     Status: Abnormal   Collection Time: 12/23/22  5:04 AM  Result Value Ref Range   WBC 14.2 (H) 4.0 - 10.5 K/uL   RBC 3.79 (L) 3.87 - 5.11 MIL/uL   Hemoglobin 10.7 (L) 12.0 - 15.0 g/dL   HCT 09.8 (L) 11.9 - 14.7 %   MCV 87.6 80.0 - 100.0 fL   MCH 28.2 26.0 - 34.0 pg   MCHC 32.2 30.0 - 36.0 g/dL   RDW 82.9 56.2 - 13.0 %   Platelets  271 150 - 400 K/uL   nRBC 0.0 0.0 - 0.2 %    Comment: Performed at [facility] Lab, [address], [place], [place] [address]  Basic metabolic panel     Status: Abnormal   Collection Time:  12/23/22  5:04 AM  Result Value Ref Range   Sodium 132 (L) 135 - 145 mmol/L   Potassium 3.3 (L) 3.5 - 5.1 mmol/L   Chloride 98 98 - 111 mmol/L   CO2 24 22 - 32 mmol/L   Glucose, Bld 155 (H) 70 - 99 mg/dL    Comment: Glucose reference range applies only to samples taken after fasting for at least 8 hours.   BUN 22 8 - 23 mg/dL   Creatinine, Ser 3.66 (H) 0.44 - 1.00 mg/dL   Calcium 9.0 8.9 - 44.0 mg/dL   GFR, Estimated 34 (L) >60 mL/min    Comment: (NOTE) Calculated using the CKD-EPI Creatinine Equation (2021)    Anion gap 10 5 - 15    Comment: Performed at [facility] Lab, [address], [place], [place] [address]  CBG monitoring, ED     Status: Abnormal   Collection Time: 12/23/22  5:11 AM  Result Value Ref Range   Glucose-Capillary 115 (H) 70 - 99 mg/dL    Comment: Glucose reference range applies only to samples taken after fasting for at least 8 hours.  CBG monitoring, ED     Status: Abnormal   Collection Time: 12/23/22  6:40 AM  Result Value Ref Range   Glucose-Capillary 129 (H) 70 - 99 mg/dL    Comment: Glucose reference range applies only to samples taken after fasting for at least 8 hours.  CBG monitoring, ED     Status: Abnormal   Collection Time: 12/23/22  8:02 AM  Result Value Ref Range   Glucose-Capillary 139 (H) 70 - 99 mg/dL    Comment: Glucose reference range applies only to samples taken after fasting for at least 8 hours.  CBG monitoring, ED     Status: Abnormal   Collection Time: 12/23/22 10:25 AM  Result Value Ref Range   Glucose-Capillary 141 (H) 70 - 99 mg/dL    Comment: Glucose reference range applies only to samples taken after fasting for at least 8 hours.  CBG monitoring, ED     Status: Abnormal   Collection Time: 12/23/22 12:09 PM  Result Value Ref Range    Glucose-Capillary 181 (H) 70 - 99 mg/dL    Comment: Glucose reference range applies only to samples taken after fasting for at least 8 hours.   EEG adult  Result Date: 12/23/2022 Charlsie Quest, MD     12/23/2022 10:34 AM Patient Name: Brianna Mitchell MRN: [id number] Epilepsy Attending: Charlsie Quest Referring Physician/Provider: Hillary Bow, DO Date: 12/23/2022 Duration: 24.22 mins Patient history: 67yo F with ams getting eeg to evaluate for seizure Level of alertness: Awake AEDs during EEG study: TPM Technical aspects: This EEG study was done with scalp electrodes positioned according to the 10-20 International system of electrode placement. Electrical activity was reviewed with band pass filter of 1-70Hz , sensitivity of 7 uV/mm, display speed of 36mm/sec with a 60Hz  notched filter applied as appropriate. EEG data were recorded continuously and digitally stored.  Video monitoring was available and reviewed as appropriate. Description: The posterior dominant rhythm consists of 7 Hz activity of moderate voltage (25-35 uV) seen predominantly in posterior head regions, symmetric and reactive to eye opening and eye closing. EEG showed intermittent generalized 3 to 6 Hz theta-delta slowing. Hyperventilation and photic stimulation were not performed.   ABNORMALITY - Intermittent slow, generalized IMPRESSION: This study is suggestive of mild diffuse encephalopathy. No seizures or epileptiform discharges were seen throughout the recording. Priyanka Annabelle Harman  MR BRAIN WO CONTRAST  Result Date: 12/23/2022 CLINICAL DATA:  Altered mental status EXAM: MRI HEAD WITHOUT CONTRAST TECHNIQUE: Multiplanar, multiecho pulse sequences of the brain and surrounding structures were obtained without intravenous contrast. COMPARISON:  None Available. FINDINGS: Brain: No acute infarct, mass effect or extra-axial collection. No chronic microhemorrhage or siderosis. There is multifocal hyperintense T2-weighted signal within the  white matter. Parenchymal volume and CSF spaces are normal. Old left basal ganglia small vessel infarct. Vascular: Normal flow voids. Skull and upper cervical spine: Normal marrow signal. Sinuses/Orbits: Negative. Other: Markedly motion degraded examination. IMPRESSION: 1. Markedly motion degraded examination. 2. No acute intracranial abnormality. 3. Old left basal ganglia small vessel infarct and findings of chronic small vessel ischemia. Electronically Signed   By: Deatra Robinson M.D.   On: 12/23/2022 03:54   DG Abd 1 View  Result Date: 12/23/2022 CLINICAL DATA:  Altered mental status, pneumonia, and encephalopathy. Vomiting, suspected aspiration. EXAM: ABDOMEN - 1 VIEW COMPARISON:  01/06/2013. FINDINGS: The bowel gas pattern is normal. Cholecystectomy clips are present in the right upper quadrant. Surgical clips are noted in the left lower quadrant. No radio-opaque calculi. The lung bases are clear. Lumbar spinal fusion hardware is noted at L3-L4. IMPRESSION: Nonobstructive bowel-gas pattern. Electronically Signed   By: Thornell Sartorius M.D.   On: 12/23/2022 02:09   CT HEAD WO CONTRAST ( )  Result Date: 12/22/2022 CLINICAL DATA:  Delirium EXAM: CT HEAD WITHOUT CONTRAST TECHNIQUE: Contiguous axial images were obtained from the base of the skull through the vertex without intravenous contrast. RADIATION DOSE REDUCTION: This exam was performed according to the departmental dose-optimization program which includes automated exposure control, adjustment of the mA and/or kV according to patient size and/or use of iterative reconstruction technique. COMPARISON:  11/21/2011 FINDINGS: Brain: No evidence of acute infarction, hemorrhage, mass, mass effect, or midline shift. No hydrocephalus or extra-axial fluid collection. Vascular: No hyperdense vessel. Skull: Negative for fracture or focal lesion. Sinuses/Orbits: No acute finding. Other: The mastoid air cells are well aerated. IMPRESSION: No acute intracranial  process. Electronically Signed   By: Wiliam Ke M.D.   On: 12/22/2022 19:01   DG Chest Portable 1 View  Result Date: 12/22/2022 CLINICAL DATA:  Altered mental status EXAM: PORTABLE CHEST 1 VIEW COMPARISON:  11/01/2012 FINDINGS: Atherosclerotic calcification of the aortic arch. The hazy right perihilar opacity and associated airway thickening, cannot exclude bronchopneumonia or aspiration pneumonitis. The left lung appears clear. Borderline enlargement of the cardiopericardial silhouette. No acute bony findings. IMPRESSION: 1. Hazy right perihilar opacity and associated airway thickening, cannot exclude bronchopneumonia or aspiration pneumonitis. 2. Borderline enlargement of the cardiopericardial silhouette. 3. Aortic Atherosclerosis (ICD10-I70.0). Electronically Signed   By: Gaylyn Rong M.D.   On: 12/22/2022 18:43    Pending Labs Unresulted Labs (From admission, onward)     Start     Ordered   12/24/22 0500  Basic metabolic panel  Tomorrow morning,   R        12/23/22 0939   12/24/22 0500  CBC  Tomorrow morning,   R        12/23/22 0939            Vitals/Pain Today's Vitals   12/23/22 0800 12/23/22 1015 12/23/22 1129 12/23/22 1524  BP: (!) 149/83  (!) 177/92 (!) 150/79  Pulse: (!) 103  96 93  Resp: 14  (!) 21 17  Temp:  99 F (37.2 C) 98 F (36.7 C) 99.2 F (37.3 C)  TempSrc:  Oral Oral  SpO2: 99%  100% 100%  Weight:        Isolation Precautions No active isolations  Medications Medications  cefTRIAXone (ROCEPHIN) 2 g in sodium chloride 0.9 % 100 mL IVPB (has no administration in time range)  enoxaparin (LOVENOX) injection 30 mg (30 mg Subcutaneous Given 12/23/22 1029)  dextrose 5 % in lactated ringers infusion (0 mLs Intravenous Stopped 12/23/22 1133)  metroNIDAZOLE (FLAGYL) IVPB 500 mg (0 mg Intravenous Stopped 12/23/22 1124)  topiramate (TOPAMAX) tablet 100 mg (100 mg Oral Given 12/23/22 1029)  potassium chloride 10 mEq in 100 mL IVPB ( Intravenous Canceled  Entry 12/23/22 1312)  lactated ringers bolus 1,000 mL (0 mLs Intravenous Stopped 12/22/22 1930)  haloperidol lactate (HALDOL) injection 5 mg (5 mg Intravenous Given 12/22/22 1650)  dextrose 50 % solution 50 mL (50 mLs Intravenous Given 12/22/22 1810)  cefTRIAXone (ROCEPHIN) 2 g in sodium chloride 0.9 % 100 mL IVPB (0 g Intravenous Stopped 12/22/22 2008)  acetaminophen (TYLENOL) suppository 650 mg (650 mg Rectal Given 12/22/22 1957)  potassium chloride 10 mEq in 100 mL IVPB (0 mEq Intravenous Stopped 12/23/22 0711)  sodium chloride 0.9 % bolus 250 mL (0 mLs Intravenous Stopped 12/23/22 1311)    Mobility PT is ambulatory with assistance      Focused Assessments Cardiac Assessment Handoff:  Cardiac Rhythm: Normal sinus rhythm Lab Results  Component Value Date   CKTOTAL 107 12/02/2009   CKMB 1.3 12/02/2009   TROPONINI <0.01        NO INDICATION OF MYOCARDIAL INJURY. 12/02/2009   No results found for: "DDIMER" Does the Patient currently have chest pain? No   , Neuro Assessment Handoff:  Swallow screen pass? Yes  Cardiac Rhythm: Normal sinus rhythm       Neuro Assessment: Exceptions to WDL Neuro Checks:      Has TPA been given? No If patient is a Neuro Trauma and patient is going to OR before floor call report to 4N Charge nurse: [phone] or [phone]   R Recommendations: See Admitting Provider Note  Report given to:   Additional Notes:  With this Clinical research associate PT has been able to answer questions and then has moments of word-salad / incomprehensible speech. This Clinical research associate is unsure if this is baseline or not, no family at bedside at this time.

## 2022-12-23 NOTE — Evaluation (Signed)
Clinical/Bedside Swallow Evaluation Patient Details  Name: Brianna Mitchell MRN: [id number] Date of Birth: 02-05-1956  Today's Date: 12/23/2022 Time: SLP Start Time (ACUTE ONLY): 1115 SLP Stop Time (ACUTE ONLY): 1127 SLP Time Calculation (min) (ACUTE ONLY): 12 min  Past Medical History:  Past Medical History:  Diagnosis Date   Anemia    Asthma    Back pain    02/2019 getting injections   Carpal tunnel syndrome    Diabetes mellitus    type 2    Fibroid tumor    Ganglion cyst    GERD (gastroesophageal reflux disease)    will see Dr. Kathrynn Mitchell 04/2018   Headache(784.0)    Heart murmur    saw dr. Jacinto Mitchell (only sees if needed, not seen since 2011)   Hypertension    dr Brianna Mitchell      Hypothyroidism    Shortness of breath    on exertion    Shoulder pain    Tendonitis    left shoulder biceps tendonitis, acromioclavicular osteoarthritis   Tennis elbow    Thyroid disease    Past Surgical History:  Past Surgical History:  Procedure Laterality Date   ABDOMINAL HYSTERECTOMY     ANTERIOR LAT LUMBAR FUSION N/A 01/03/2013   Procedure: X LIF (Extreme Lateral Interbody Fusion) L3-L4,  (1 LEVEL) ;  Surgeon: Brianna Lick, MD;  Location: [facility] OR;  Service: Orthopedics;  Laterality: N/A;   BACK SURGERY     x5   CARDIAC CATHETERIZATION     CHOLECYSTECTOMY     COLONOSCOPY  04/18/2017   Dr Brianna Mitchell ; [facility]    EYE SURGERY  05/23/2017   laser of right eye, to have left eye done on 06-13-17 - for cataracts    KNEE ARTHROSCOPY Right 04/2014   LAPAROSCOPY N/A 05/30/2017   Procedure: LAPAROSCOPY DIAGNOSTIC;  Surgeon: Brianna Adu, MD;  Location: [facility] ORS;  Service: General;  Laterality: N/A;   LEFT HEART CATHETERIZATION WITH CORONARY ANGIOGRAM N/A 04/27/2011   Procedure: LEFT HEART CATHETERIZATION WITH CORONARY ANGIOGRAM;  Surgeon: Brianna Pert, MD;  Location: [facility] CATH LAB;  Service: Cardiovascular;  Laterality: N/A;   pelvic bone removed     ROTATOR CUFF REPAIR Left    x3 surgeries;  Dr Brianna Mitchell.[facility]    SHOULDER ARTHROSCOPY WITH SUBACROMIAL DECOMPRESSION, ROTATOR CUFF REPAIR AND BICEP TENDON REPAIR Left 02/03/2016   Procedure: SHOULDER ARTHROSCOPY WITH SUBACROMIAL DECOMPRESSION, DEBRIDEMENT, BICEPS TENODESIS, DISTAL CLAVICLE EXCISION.;  Surgeon: Brianna Copa, MD;  Location: [facility] OR;  Service: Orthopedics;  Laterality: Left;   THYROID SURGERY     LUMP REMOVED   TUBAL LIGATION     HPI:  Brianna Mitchell is a 35 yof who presented to ED via EMS after being found with profound hypoglycemia. Dx severe sepsis secondary to aspiration pna, metabolic encephalopathy, AKI.  MRI negative for acute abnormalities.    Assessment / Plan / Recommendation  Clinical Impression  Pt presents with normal oropharyngeal swallow.  Continues to be confused but redirectable; follows commands; makes needs known. Oral mechanism exam is normal; no focal deficits; dentition present. Demonstrates thorough mastication, brisk swallow response, and no s/s of aspiration. No dysphagia identifed. Resume regular diet, thin liquids; meds whole in thin liquid. No SLP f/u needed. SLP Visit Diagnosis: Dysphagia, unspecified (R13.10)    Aspiration Risk  No limitations    Diet Recommendation   Thin;Age appropriate regular  Medication Administration: Whole meds with liquid    Other  Recommendations Oral Care Recommendations: Oral care  BID    Recommendations for follow up therapy are one component of a multi-disciplinary discharge planning process, led by the attending physician.  Recommendations may be updated based on patient status, additional functional criteria and insurance authorization.  Follow up Recommendations No SLP follow up        Swallow Study   General HPI: Brianna Mitchell is a 49 yof who presented to ED via EMS after being found with profound hypoglycemia. Dx severe sepsis secondary to aspiration pna, metabolic encephalopathy, AKI.  MRI negative for acute abnormalities. Type of  Study: Bedside Swallow Evaluation Previous Swallow Assessment: no Diet Prior to this Study: NPO Temperature Spikes Noted: No Respiratory Status: Room air History of Recent Intubation: No Behavior/Cognition: Alert;Cooperative;Confused Oral Cavity Assessment: Within Functional Limits Oral Care Completed by SLP: No Oral Cavity - Dentition: Adequate natural dentition Vision: Functional for self-feeding Self-Feeding Abilities: Able to feed self Patient Positioning: Upright in bed Baseline Vocal Quality: Normal Volitional Cough: Strong Volitional Swallow: Able to elicit    Oral/Motor/Sensory Function Overall Oral Motor/Sensory Function: Within functional limits   Ice Chips Ice chips: Within functional limits   Thin Liquid Thin Liquid: Within functional limits    Nectar Thick Nectar Thick Liquid: Not tested   Honey Thick Honey Thick Liquid: Not tested   Puree Puree: Within functional limits   Solid     Solid: Within functional limits      Brianna Mitchell 12/23/2022,11:29 AM  Brianna Folks L. Samson Frederic, MA CCC/SLP Clinical Specialist - Acute Care SLP Acute Rehabilitation Services Office number [phone]

## 2022-12-23 NOTE — H&P (Signed)
History and Physical    Patient: Brianna Mitchell GEX:[id number] DOB: 03-16-1955 DOA: 12/22/2022 DOS: the patient was seen and examined on 12/23/2022 PCP: Betsy Coder Zannie Cove, MD  Patient coming from: Home  Chief Complaint: AMS  HPI: Brianna Mitchell is a 67 y.o. female with medical history significant of DM2, HTN, hypothyroidism, migraines, chronic pain from lumbar radiculopathy on chronic opiate pain management.  Pt brought in by EMS for evaluation of AMS.  EMS called out to house for AMS, BGL found to be 34 and pt being given bag valve respiration by fire department.  Not given narcan by EMS.  Given D10 by EMS, BGL began to increase and had increase in purposeful movements however still confused / agitated on arrival to ED.  Noted to have vomit in mouth and suspected to have aspirated.  Due to agitation in ED apparently given haldol.  Then given tylenol after noted she was running fever of 101.4.  CXR suspicious for PNA.  Pt started on rocephin.  CT head without acute findings.  CBG in the low 100s on a D10 GTT.   Review of Systems: As mentioned in the history of present illness. All other systems reviewed and are negative. Past Medical History:  Diagnosis Date   Anemia    Asthma    Back pain    02/2019 getting injections   Carpal tunnel syndrome    Diabetes mellitus    type 2    Fibroid tumor    Ganglion cyst    GERD (gastroesophageal reflux disease)    will see Dr. Kathrynn Running 04/2018   Headache(784.0)    Heart murmur    saw dr. Jacinto Halim (only sees if needed, not seen since 2011)   Hypertension    dr Jacinto Halim      Hypothyroidism    Shortness of breath    on exertion    Shoulder pain    Tendonitis    left shoulder biceps tendonitis, acromioclavicular osteoarthritis   Tennis elbow    Thyroid disease    Past Surgical History:  Procedure Laterality Date   ABDOMINAL HYSTERECTOMY     ANTERIOR LAT LUMBAR FUSION N/A 01/03/2013   Procedure: X LIF (Extreme Lateral Interbody Fusion) L3-L4,   (1 LEVEL) ;  Surgeon: Venita Lick, MD;  Location: [facility] OR;  Service: Orthopedics;  Laterality: N/A;   BACK SURGERY     x5   CARDIAC CATHETERIZATION     CHOLECYSTECTOMY     COLONOSCOPY  04/18/2017   Dr Charna Elizabeth ; [facility]    EYE SURGERY  05/23/2017   laser of right eye, to have left eye done on 06-13-17 - for cataracts    KNEE ARTHROSCOPY Right 04/2014   LAPAROSCOPY N/A 05/30/2017   Procedure: LAPAROSCOPY DIAGNOSTIC;  Surgeon: Gaynelle Adu, MD;  Location: [facility] ORS;  Service: General;  Laterality: N/A;   LEFT HEART CATHETERIZATION WITH CORONARY ANGIOGRAM N/A 04/27/2011   Procedure: LEFT HEART CATHETERIZATION WITH CORONARY ANGIOGRAM;  Surgeon: Pamella Pert, MD;  Location: [facility] CATH LAB;  Service: Cardiovascular;  Laterality: N/A;   pelvic bone removed     ROTATOR CUFF REPAIR Left    x3 surgeries; Dr Lorin Picket Dean.[facility]    SHOULDER ARTHROSCOPY WITH SUBACROMIAL DECOMPRESSION, ROTATOR CUFF REPAIR AND BICEP TENDON REPAIR Left 02/03/2016   Procedure: SHOULDER ARTHROSCOPY WITH SUBACROMIAL DECOMPRESSION, DEBRIDEMENT, BICEPS TENODESIS, DISTAL CLAVICLE EXCISION.;  Surgeon: Cammy Copa, MD;  Location: [facility] OR;  Service: Orthopedics;  Laterality: Left;   THYROID SURGERY  LUMP REMOVED   TUBAL LIGATION     Social History:  reports that she has never smoked. She has never used smokeless tobacco. She reports that she does not drink alcohol and does not use drugs.  Allergies  Allergen Reactions   Aspirin Swelling   Sulfa Antibiotics Swelling   Demerol Rash   Penicillins Rash and Other (See Comments)    Has patient had a PCN reaction causing immediate rash, facial/tongue/throat swelling, SOB or lightheadedness with hypotension: No Has patient had a PCN reaction causing severe rash involving mucus membranes or skin necrosis: No Has patient had a PCN reaction that required hospitalization: Yes Has patient had a PCN reaction occurring within the last 10 years: No  If  all of the above answers are "NO", then may proceed with Cephalosporin use.     Family History  Problem Relation Age of Onset   Heart disease Mother    Heart disease Father    Breast cancer Sister    Heart disease Sister    Heart disease Brother    Heart disease Paternal Uncle     Prior to Admission medications   Medication Sig Start Date End Date Taking? Authorizing Provider  albuterol (PROVENTIL) (2.5 MG/3ML) 0.083% nebulizer solution Take 3 mLs (2.5 mg total) by nebulization every 6 (six) hours as needed for wheezing or shortness of breath. 05/20/20   Arnette Felts, FNP  albuterol (VENTOLIN HFA) 108 (90 Base) MCG/ACT inhaler Inhale 2 puffs into the lungs every 6 (six) hours as needed for wheezing or shortness of breath. 08/08/20   Arnette Felts, FNP  amLODipine (NORVASC) 5 MG tablet Take 5 mg by mouth daily. 02/19/21   [provider]  Atogepant (QULIPTA) 60 MG TABS Take 1 tablet (60 mg total) by mouth daily. 04/06/22   Lomax, Amy, NP  beclomethasone (QVAR) 80 MCG/ACT inhaler Inhale 2 puffs into the lungs once for 1 dose. Patient taking differently: Inhale 2 puffs into the lungs daily as needed (shortness of breath). 05/20/20 12/22/22  Arnette Felts, FNP  dapagliflozin propanediol (FARXIGA) 10 MG TABS tablet Take 10 mg by mouth daily. 10/26/22   [provider]  desloratadine (CLARINEX) 5 MG tablet Take 1 tablet (5 mg total) by mouth daily. 05/20/20   Arnette Felts, FNP  dexlansoprazole (DEXILANT) 60 MG capsule Take 1 capsule (60 mg total) by mouth daily. 05/20/20   Arnette Felts, FNP  docusate sodium (COLACE) 100 MG capsule Take 100 mg by mouth 2 (two) times daily as needed. 12/02/22   [provider]  DULoxetine (CYMBALTA) 60 MG capsule Take 60 mg by mouth every morning. 12/15/22   [provider]  Erenumab-aooe (AIMOVIG) 140 MG/ML SOAJ Inject 140 mg into the skin every 30 (thirty) days.    [provider]  FARXIGA 5 MG TABS tablet Take 5 mg by mouth  daily. 11/08/22   [provider]  fluticasone (FLOVENT HFA) 44 MCG/ACT inhaler INHALE ONE PUFF into THE lungs TWICE DAILY Patient taking differently: Inhale 1 puff into the lungs in the morning and at bedtime. 05/07/22   Arnette Felts, FNP  HUMALOG KWIKPEN 100 UNIT/ML KwikPen Inject 5-18 Units into the skin 3 (three) times daily. Based on sliding scale 70-100= 5 units, 101-150 = 7 units, 151-200 = 9 units, 201-250 = 11 units, 251-300 = 13 units, 301-350 = 15 units, > 350= 18 units Patient taking differently: Inject 8-12 Units into the skin 3 (three) times daily. 05/20/20   Arnette Felts, FNP  HUMULIN N  KWIKPEN 100 UNIT/ML Kiwkpen Inject 45-55 Units into the skin See admin instructions. Inject 55 units SQ in the morning and inject 30 units SQ in the evening Patient taking differently: Inject 5 Units into the skin in the morning and at bedtime. 05/20/20   Arnette Felts, FNP  hydrocortisone cream 0.5 % Apply 1 application topically 2 (two) times daily. 05/20/20   Arnette Felts, FNP  Lactobacillus TABS Take 1 tablet by mouth daily. 07/03/18   Arnette Felts, FNP  levothyroxine (SYNTHROID) 75 MCG tablet Take 1 tablet (75 mcg total) by mouth daily before breakfast. 05/20/20   Arnette Felts, FNP  lidocaine (XYLOCAINE) 5 % ointment Apply 1 application. topically as needed for moderate pain.    [provider]  lisinopril (ZESTRIL) 10 MG tablet Take 10 mg by mouth daily. 04/10/21   [provider]  loratadine (CLARITIN) 10 MG tablet Take 10 mg by mouth daily.    [provider]  metoCLOPramide (REGLAN) 10 MG tablet Take 10 mg by mouth 2 (two) times daily. 12/01/22   [provider]  moxifloxacin (VIGAMOX) 0.5 % ophthalmic solution Place 1 drop into the left eye in the morning, at noon, in the evening, and at bedtime.    [provider]  NARCAN 4 MG/0.1ML LIQD nasal spray kit Place 0.4 mg into the nose once. 12/03/22   [provider]  ondansetron (ZOFRAN) 4  MG tablet Take 4 mg by mouth every 8 (eight) hours as needed for nausea or vomiting.    [provider]  oxyCODONE-acetaminophen (PERCOCET) 10-325 MG tablet Take 1 tablet by mouth every 6 (six) hours as needed for pain.    [provider]  oxymorphone (OPANA ER) 30 MG 12 hr tablet Take 30 mg by mouth every 12 (twelve) hours.  11/02/16   [provider]  polyethylene glycol (MIRALAX / GLYCOLAX) packet Take 17 g by mouth daily. Patient taking differently: Take 17 g by mouth daily as needed for mild constipation. 01/05/13   Naida Sleight, PA-C  rizatriptan (MAXALT-MLT) 10 MG disintegrating tablet Take 1 tablet (10 mg total) by mouth as needed for migraine. May repeat in 2 hours if needed 03/23/22   Lomax, Amy, NP  rosuvastatin (CRESTOR) 40 MG tablet TAKE ONE TABLET BY MOUTH EVERY DAY 07/22/21   Arnette Felts, FNP  tiZANidine (ZANAFLEX) 4 MG tablet Take 4 mg by mouth 2 (two) times daily as needed. 12/01/22   [provider]  topiramate (TOPAMAX) 100 MG tablet Take 1 tablet (100 mg total) by mouth 2 (two) times daily. 03/16/22   Shawnie Dapper, NP    Physical Exam: Vitals:   12/22/22 2208 12/22/22 2330 12/23/22 0000 12/23/22 0021  BP:  (!) 145/88 133/85   Pulse:   91   Resp:  13 20   Temp: 100.2 F (37.9 C)     TempSrc: Oral     SpO2:   96%   Weight:    67.6 kg   Constitutional: Altered Respiratory: CTA B  Cardiovascular: Tachycardic, dont really appreciate a murmur at this time, though I see she has a h/o one. Abdomen: no tenderness, no masses palpated. No hepatosplenomegaly. Bowel sounds positive.  Neurologic: Pt with increased tone / flexion of BUE, eyes open, is following me around room with eyes and head.  Completely aphasic however.  Not following commands.  Data Reviewed:    Labs on Admission: I have personally reviewed following labs and imaging studies  CBC: Recent Labs  Lab 12/22/22  1541 12/22/22 1602  WBC 8.4  --   NEUTROABS 6.9  --   HGB  11.9* 13.6  HCT 39.0 40.0  MCV 90.3  --   PLT 309  --    Basic Metabolic Panel: Recent Labs  Lab 12/22/22 1541 12/22/22 1602  NA 135 136  K 3.1* 3.2*  CL 96*  --   CO2 26  --   GLUCOSE 185*  --   BUN 25*  --   CREATININE 1.73*  --   CALCIUM 9.5  --    GFR: CrCl cannot be calculated (Unknown ideal weight.). Liver Function Tests: Recent Labs  Lab 12/22/22 1541  AST 25  ALT 17  ALKPHOS 53  BILITOT 0.7  PROT 6.5  ALBUMIN 3.4*   Recent Labs  Lab 12/22/22 1541  LIPASE 33   Recent Labs  Lab 12/22/22 1525  AMMONIA <10   Coagulation Profile: No results for input(s): "INR", "PROTIME" in the last 168 hours. Cardiac Enzymes: No results for input(s): "CKTOTAL", "CKMB", "CKMBINDEX", "TROPONINI" in the last 168 hours. BNP (last 3 results) No results for input(s): "PROBNP" in the last 8760 hours. HbA1C: No results for input(s): "HGBA1C" in the last 72 hours. CBG: Recent Labs  Lab 12/22/22 1803 12/22/22 1900 12/22/22 2014 12/22/22 2155 12/23/22 0039  GLUCAP 71 133* 114* 118* 187*   Lipid Profile: No results for input(s): "CHOL", "HDL", "LDLCALC", "TRIG", "CHOLHDL", "LDLDIRECT" in the last 72 hours. Thyroid Function Tests: Recent Labs    12/22/22 1626  TSH 2.577  FREET4 0.90   Anemia Panel: No results for input(s): "VITAMINB12", "FOLATE", "FERRITIN", "TIBC", "IRON", "RETICCTPCT" in the last 72 hours. Urine analysis:    Component Value Date/Time   COLORURINE YELLOW 12/22/2022 1554   APPEARANCEUR HAZY (A) 12/22/2022 1554   LABSPEC 1.025 12/22/2022 1554   PHURINE 5.0 12/22/2022 1554   GLUCOSEU 50 (A) 12/22/2022 1554   HGBUR SMALL (A) 12/22/2022 1554   BILIRUBINUR NEGATIVE 12/22/2022 1554   BILIRUBINUR negative 03/08/2018 1405   KETONESUR NEGATIVE 12/22/2022 1554   PROTEINUR >=300 (A) 12/22/2022 1554   UROBILINOGEN 0.2 03/08/2018 1405   UROBILINOGEN 0.2 12/02/2009 1134   NITRITE NEGATIVE 12/22/2022 1554   LEUKOCYTESUR NEGATIVE 12/22/2022 1554     Radiological Exams on Admission: CT HEAD WO CONTRAST ( )  Result Date: 12/22/2022 CLINICAL DATA:  Delirium EXAM: CT HEAD WITHOUT CONTRAST TECHNIQUE: Contiguous axial images were obtained from the base of the skull through the vertex without intravenous contrast. RADIATION DOSE REDUCTION: This exam was performed according to the departmental dose-optimization program which includes automated exposure control, adjustment of the mA and/or kV according to patient size and/or use of iterative reconstruction technique. COMPARISON:  11/21/2011 FINDINGS: Brain: No evidence of acute infarction, hemorrhage, mass, mass effect, or midline shift. No hydrocephalus or extra-axial fluid collection. Vascular: No hyperdense vessel. Skull: Negative for fracture or focal lesion. Sinuses/Orbits: No acute finding. Other: The mastoid air cells are well aerated. IMPRESSION: No acute intracranial process. Electronically Signed   By: Wiliam Ke M.D.   On: 12/22/2022 19:01   DG Chest Portable 1 View  Result Date: 12/22/2022 CLINICAL DATA:  Altered mental status EXAM: PORTABLE CHEST 1 VIEW COMPARISON:  11/01/2012 FINDINGS: Atherosclerotic calcification of the aortic arch. The hazy right perihilar opacity and associated airway thickening, cannot exclude bronchopneumonia or aspiration pneumonitis. The left lung appears clear. Borderline enlargement of the cardiopericardial silhouette. No acute bony findings. IMPRESSION: 1. Hazy right perihilar opacity and associated airway thickening, cannot exclude bronchopneumonia or aspiration pneumonitis.  2. Borderline enlargement of the cardiopericardial silhouette. 3. Aortic Atherosclerosis (ICD10-I70.0). Electronically Signed   By: Gaylyn Rong M.D.   On: 12/22/2022 18:43    EKG: Independently reviewed.   Assessment and Plan: * Acute encephalopathy Pt with initial hypoglycemic encephalopathy, then had vomiting and aspiration after given D50 by EMS. Agitated in ED initially,  given haldol by EDP. Now pt with ongoing encephalopathy vs aphasia as described in physical exam above. DDx includes but not limited to: 1) Delirium due to PNA / fever 2) hypoglycemic brain injury due to initial hypoglycemia of unclear duration with EMS 3) stroke 4) persistent AMS from haldol given earlier. 5) post-ictal state  CT head initially without acute finding Trying to obtain MRI brain to exclude hypoglycemic brain injury and ischemic stroke Remains on D10 IVF for hypoglycemia, euglycemia being maintained in ED Tylenol PRN fever May need to get neuro consult, though trying to get MRI brain first  AKI (acute kidney injury) (HCC) Creat 1.7 today up from 1.2-1.3 baseline. IVF Strict intake and output Repeat BMP in AM  Aspiration pneumonia (HCC) Pt with fever, witnessed aspiration by EMS following vomiting, infiltrate on CXR. PNA pathway rocephin + azithro Tylenol PRN fever Repeat CBC in AM Not requiring O2 currently.  Chronic prescription opiate use Pt NPO for the moment Will hold for the moment pending work up of AMS. Opiate OD seems less likely given initial agitation, ability to eye open to minimal stimulation.  Now currently awake and looking around the room but aphasic.  Not really c/w opiate OD.  Hypothyroidism Either resume PO synthroid if able to take POs, or IV synthroid in 2 days if not per protocol. TSH and T4 currently wnl  Essential hypertension Monitor BPs currently NPO for the moment pending mental status work up.  DM2 (diabetes mellitus, type 2) (HCC) Complicated by initial hypoglycemia with CBG of 34.  Recurrent hypoglycemia following D50. Now on D10 CBG checks Q1H for the moment.      Advance Care Planning:   Code Status: Full Code  Consults: None  Family Communication: No family in room  Severity of Illness: The appropriate patient status for this patient is OBSERVATION. Observation status is judged to be reasonable and necessary in  order to provide the required intensity of service to ensure the patient's safety. The patient's presenting symptoms, physical exam findings, and initial radiographic and laboratory data in the context of their medical condition is felt to place them at decreased risk for further clinical deterioration. Furthermore, it is anticipated that the patient will be medically stable for discharge from the hospital within 2 midnights of admission.   Author: Hillary Bow., DO 12/23/2022 12:47 AM  For on call review [email or web address].

## 2022-12-23 NOTE — ED Notes (Signed)
This Clinical research associate spoke with Radiation protection practitioner to inform that PT was on the way to floor.

## 2022-12-23 NOTE — Assessment & Plan Note (Signed)
Creat 1.7 today up from 1.2-1.3 baseline. IVF Strict intake and output Repeat BMP in AM

## 2022-12-23 NOTE — Assessment & Plan Note (Signed)
Either resume PO synthroid if able to take POs, or IV synthroid in 2 days if not per protocol. TSH and T4 currently wnl

## 2022-12-23 NOTE — Plan of Care (Signed)

## 2022-12-23 NOTE — Hospital Course (Addendum)
67 year old woman PMH including migraine headaches, diabetes on insulin who presented from home for evaluation of altered mental status, at home found to have a blood sugar of 34 and given bag valve respirations by fire department.  Was given dextrose with improvement in purposeful movements but noted to still be confused.  Also noted to have vomit in mouth and suspected to have aspirated.  Noted to be confused in the emergency department requiring Haldol.  Workup revealed fever, chest x-ray concerning for aspiration.  Multiple other abnormalities.  Admitted for further evaluation and treatment.  Consultants None  Procedures None

## 2022-12-23 NOTE — Progress Notes (Signed)
 EEG complete - results pending.  GMD/CP

## 2022-12-23 NOTE — ED Notes (Signed)
Patient transported to MRI 

## 2022-12-23 NOTE — Assessment & Plan Note (Signed)
Monitor BPs currently NPO for the moment pending mental status work up.

## 2022-12-23 NOTE — ED Notes (Signed)
Pt now able to form some words. Informed this RN she was cold when I last entered the room so warm blankets were provided. This RN attempted to ask the patient what her name but she answered "what you mean" when asked that question multiple times. Pts words are clear, however it would appear the patient is not comprehending questions being asked. MD notified.

## 2022-12-23 NOTE — Assessment & Plan Note (Addendum)
Pt NPO for the moment Will hold for the moment pending work up of AMS. Opiate OD seems less likely given initial agitation, ability to eye open to minimal stimulation.  Now currently awake and looking around the room but aphasic.  Not really c/w opiate OD.

## 2022-12-23 NOTE — Procedures (Signed)
Patient Name: Brianna Mitchell  MRN: [id number]  Epilepsy Attending: Charlsie Quest  Referring Physician/Provider: Hillary Bow, DO  Date: 12/23/2022 Duration: 24.22 mins  Patient history: 67yo F with ams getting eeg to evaluate for seizure  Level of alertness: Awake  AEDs during EEG study: TPM  Technical aspects: This EEG study was done with scalp electrodes positioned according to the 10-20 International system of electrode placement. Electrical activity was reviewed with band pass filter of 1-70Hz , sensitivity of 7 uV/mm, display speed of 90mm/sec with a 60Hz  notched filter applied as appropriate. EEG data were recorded continuously and digitally stored.  Video monitoring was available and reviewed as appropriate.  Description: The posterior dominant rhythm consists of 7 Hz activity of moderate voltage (25-35 uV) seen predominantly in posterior head regions, symmetric and reactive to eye opening and eye closing. EEG showed intermittent generalized 3 to 6 Hz theta-delta slowing. Hyperventilation and photic stimulation were not performed.     ABNORMALITY - Intermittent slow, generalized  IMPRESSION: This study is suggestive of mild diffuse encephalopathy. No seizures or epileptiform discharges were seen throughout the recording.  Ivalene Platte Annabelle Harman

## 2022-12-23 NOTE — Assessment & Plan Note (Addendum)
Complicated by initial hypoglycemia with CBG of 34.  Recurrent hypoglycemia following D50. Now on D10 CBG checks Q1H for the moment.

## 2022-12-23 NOTE — Progress Notes (Signed)
Progress Note   Patient: Brianna Mitchell ZOX:[id number] DOB: 05/03/1955 DOA: 12/22/2022     0 DOS: the patient was seen and examined on 12/23/2022   Brief hospital course: 67 year old woman PMH including migraine headaches, diabetes on insulin who presented from home for evaluation of altered mental status, at home found to have a blood sugar of 34 and given bag valve respirations by fire department.  Was given dextrose with improvement in purposeful movements but noted to still be confused.  Also noted to have vomit in mouth and suspected to have aspirated.  Noted to be confused in the emergency department requiring Haldol.  Workup revealed fever, chest x-ray concerning for aspiration.  Multiple other abnormalities.  Admitted for further evaluation and treatment.  Consultants None  Procedures None   Assessment and Plan: *Severe sepsis secondary to aspiration pneumonia Met criteria with fever, tachycardia and lactic acidosis.   Chest x-ray concerning for aspiration  Condition complicated by acute metabolic encephalopathy and profound hypoglycemia  Hemodynamic stable.  No hypoxia.  Respiratory status appears stable.   Continue empiric antibiotics and follow.  Sepsis appears resolving.  Lactic acid was elevated without recheck however given clinical stability will not pursue further blood draws. Speech therapy evaluation  Acute metabolic encephalopathy In the context of sepsis and profound hypoglycemia.  Patient still with slow cognition although oriented and does follow some commands appropriately.  MRI brain motion degraded but no acute abnormalities noted.  CT head without acute findings.  Blood sugars have been corrected. EEG completed, await results No history at this time to definitely suggest seizure.  No antiepileptics unless second event or convincing evidence of seizure. Therapy evaluations Continue to treat sepsis and monitor blood sugars  DM2 (diabetes mellitus, type 2)  (HCC) Profound hypoglycemia prior to admission Patient self administers insulin at home and reportedly had poor appetite.  Hypoglycemia may be secondary to insulin without food or sepsis. Continue to monitor blood sugars.  Hold long-acting insulin at this time.  Reinitiate when needed. Beta hydroxybutyric acid was elevated on admission but normal anion gap.  Suspect starvation ketosis.  AKI (acute kidney injury) (HCC) Creatinine 1.7 today up from 1.2-1.3 baseline. Creatinine a bit better today at 1.62 Further IV fluids and recheck BMP in AM.  Hold lisinopril.  Elevated troponin Trivial elevation.  No signs or symptoms of ACS.  EKG nonacute.  No further evaluation suggested.  Hypokalemia Replete.  Chronic prescription opiate use Was not treated with Narcan in the field.  No history thus far to suggest opiate misuse or overdose. Monitor for withdrawal but at this point we will hold opiates given confusion.  Hypothyroidism TSH and T4 currently wnl  Essential hypertension         Subjective:  Feels okay today No complaints Reports not eating well at home Does self administrating insulin at home  Physical Exam: Vitals:   12/23/22 0600 12/23/22 0615 12/23/22 0700 12/23/22 0800  BP: (!) 137/100  (!) 148/79 (!) 149/83  Pulse:  96  (!) 103  Resp:  18 17 14   Temp:  99.3 F (37.4 C)    TempSrc:  Oral    SpO2:  99%  99%  Weight:       Physical Exam Vitals reviewed.  Constitutional:      General: She is not in acute distress.    Appearance: She is not ill-appearing or toxic-appearing.  Cardiovascular:     Rate and Rhythm: Normal rate and regular rhythm.     Heart  sounds: No murmur heard. Pulmonary:     Effort: Pulmonary effort is normal. No respiratory distress.     Breath sounds: No wheezing, rhonchi or rales.  Abdominal:     Palpations: Abdomen is soft.  Neurological:     Mental Status: She is alert and oriented to person, place, and time.     Cranial Nerves: No  cranial nerve deficit.     Motor: No weakness.     Coordination: Coordination normal.  Psychiatric:     Comments: Speech is slow but intelligible and appropriate.  Follows some but not all commands.  Slightly confused.     Data Reviewed: CBG stable, no lows Potassium 3.3 Creatinine 1.62 Troponin 49 > 64 Lactic acid 2.8 > 3.1, no recheck since TSH within normal limits Beta hydroxybutyric acid 0.91 on admission, normal anion gap at that time MRI brain no acute abnormality  Family Communication: none present  Disposition: Status is: Observation   Planned Discharge Destination:  pending further evaluation    Time spent: 45 minutes  Author: Brendia Sacks, MD 12/23/2022 9:41 AM  For on call review [email or web address].

## 2022-12-23 NOTE — Assessment & Plan Note (Signed)
Pt with fever, witnessed aspiration by EMS following vomiting, infiltrate on CXR. PNA pathway rocephin + azithro Tylenol PRN fever Repeat CBC in AM Not requiring O2 currently.

## 2022-12-23 NOTE — Assessment & Plan Note (Addendum)
Pt with initial hypoglycemic encephalopathy, then had vomiting and aspiration after given D50 by EMS. Agitated in ED initially, given haldol by EDP. Now pt with ongoing encephalopathy vs aphasia as described in physical exam above. DDx includes but not limited to: 1) Delirium due to PNA / fever 2) hypoglycemic brain injury due to initial hypoglycemia of unclear duration with EMS 3) stroke 4) persistent AMS from haldol given earlier. 5) post-ictal state  CT head initially without acute finding Trying to obtain MRI brain to exclude hypoglycemic brain injury and ischemic stroke Remains on D10 IVF for hypoglycemia, euglycemia being maintained in ED Tylenol PRN fever May need to get neuro consult, though trying to get MRI brain first

## 2022-12-24 DIAGNOSIS — G9340 Encephalopathy, unspecified: Secondary | ICD-10-CM | POA: Diagnosis not present

## 2022-12-24 DIAGNOSIS — E162 Hypoglycemia, unspecified: Secondary | ICD-10-CM | POA: Diagnosis not present

## 2022-12-24 DIAGNOSIS — R4182 Altered mental status, unspecified: Secondary | ICD-10-CM | POA: Diagnosis not present

## 2022-12-24 DIAGNOSIS — A419 Sepsis, unspecified organism: Secondary | ICD-10-CM | POA: Diagnosis not present

## 2022-12-24 DIAGNOSIS — E11641 Type 2 diabetes mellitus with hypoglycemia with coma: Secondary | ICD-10-CM | POA: Diagnosis not present

## 2022-12-24 DIAGNOSIS — J690 Pneumonitis due to inhalation of food and vomit: Secondary | ICD-10-CM | POA: Diagnosis not present

## 2022-12-24 DIAGNOSIS — N179 Acute kidney failure, unspecified: Secondary | ICD-10-CM | POA: Diagnosis not present

## 2022-12-24 LAB — CBC
HCT: 31 % — ABNORMAL LOW (ref 36.0–46.0)
Hemoglobin: 10 g/dL — ABNORMAL LOW (ref 12.0–15.0)
MCH: 28.6 pg (ref 26.0–34.0)
MCHC: 32.3 g/dL (ref 30.0–36.0)
MCV: 88.6 fL (ref 80.0–100.0)
Platelets: 272 10*3/uL (ref 150–400)
RBC: 3.5 MIL/uL — ABNORMAL LOW (ref 3.87–5.11)
RDW: 14.6 % (ref 11.5–15.5)
WBC: 10.7 10*3/uL — ABNORMAL HIGH (ref 4.0–10.5)
nRBC: 0 % (ref 0.0–0.2)

## 2022-12-24 LAB — BASIC METABOLIC PANEL
Anion gap: 9 (ref 5–15)
BUN: 17 mg/dL (ref 8–23)
CO2: 25 mmol/L (ref 22–32)
Calcium: 8.9 mg/dL (ref 8.9–10.3)
Chloride: 107 mmol/L (ref 98–111)
Creatinine, Ser: 1.46 mg/dL — ABNORMAL HIGH (ref 0.44–1.00)
GFR, Estimated: 39 mL/min — ABNORMAL LOW (ref >60)
Glucose, Bld: 128 mg/dL — ABNORMAL HIGH (ref 70–99)
Potassium: 3.9 mmol/L (ref 3.5–5.1)
Sodium: 141 mmol/L (ref 135–145)

## 2022-12-24 LAB — GLUCOSE, CAPILLARY
Glucose-Capillary: 120 mg/dL — ABNORMAL HIGH (ref 70–99)
Glucose-Capillary: 131 mg/dL — ABNORMAL HIGH (ref 70–99)
Glucose-Capillary: 187 mg/dL — ABNORMAL HIGH (ref 70–99)
Glucose-Capillary: 297 mg/dL — ABNORMAL HIGH (ref 70–99)
Glucose-Capillary: 272 mg/dL — ABNORMAL HIGH (ref 70–99)
Glucose-Capillary: 182 mg/dL — ABNORMAL HIGH (ref 70–99)

## 2022-12-24 LAB — HEMOGLOBIN A1C
Hgb A1c MFr Bld: 8.3 % — ABNORMAL HIGH (ref 4.8–5.6)
Mean Plasma Glucose: 191.51 mg/dL

## 2022-12-24 MED ORDER — PANTOPRAZOLE SODIUM 40 MG PO TBEC
40.0000 mg | DELAYED_RELEASE_TABLET | Freq: Every day | ORAL | Status: DC
  Administered 2022-12-24 – 2022-12-29 (×6): 40 mg via ORAL
  Filled 2022-12-24 (×6): qty 1

## 2022-12-24 MED ORDER — OXYCODONE-ACETAMINOPHEN 5-325 MG PO TABS
1.0000 | ORAL_TABLET | Freq: Four times a day (QID) | ORAL | Status: DC | PRN
  Administered 2022-12-27 – 2022-12-29 (×7): 1 via ORAL
  Filled 2022-12-24 (×7): qty 1

## 2022-12-24 MED ORDER — ENSURE ENLIVE PO LIQD
237.0000 mL | Freq: Two times a day (BID) | ORAL | Status: DC
  Administered 2022-12-24 – 2022-12-29 (×10): 237 mL via ORAL

## 2022-12-24 MED ORDER — INSULIN ASPART 100 UNIT/ML IJ SOLN
0.0000 [IU] | Freq: Three times a day (TID) | INTRAMUSCULAR | Status: DC
  Administered 2022-12-24: 3 [IU] via SUBCUTANEOUS
  Administered 2022-12-25 (×2): 2 [IU] via SUBCUTANEOUS
  Administered 2022-12-25: 1 [IU] via SUBCUTANEOUS
  Administered 2022-12-26: 3 [IU] via SUBCUTANEOUS
  Administered 2022-12-26 (×2): 1 [IU] via SUBCUTANEOUS

## 2022-12-24 MED ORDER — LEVOTHYROXINE SODIUM 75 MCG PO TABS
75.0000 ug | ORAL_TABLET | Freq: Every day | ORAL | Status: DC
  Administered 2022-12-24 – 2022-12-29 (×6): 75 ug via ORAL
  Filled 2022-12-24 (×6): qty 1

## 2022-12-24 MED ORDER — FLUTICASONE FUROATE-VILANTEROL 100-25 MCG/ACT IN AEPB
1.0000 | INHALATION_SPRAY | Freq: Every day | RESPIRATORY_TRACT | Status: DC
  Administered 2022-12-25 – 2022-12-29 (×5): 1 via RESPIRATORY_TRACT
  Filled 2022-12-24: qty 28

## 2022-12-24 MED ORDER — ALBUTEROL SULFATE (2.5 MG/3ML) 0.083% IN NEBU: 2.5000 mg | INHALATION_SOLUTION | Freq: Four times a day (QID) | RESPIRATORY_TRACT | Status: DC | PRN

## 2022-12-24 MED ORDER — AMLODIPINE BESYLATE 5 MG PO TABS
5.0000 mg | ORAL_TABLET | Freq: Every day | ORAL | Status: DC
  Administered 2022-12-24: 5 mg via ORAL
  Filled 2022-12-24: qty 1

## 2022-12-24 NOTE — Progress Notes (Addendum)
PROGRESS NOTE        PATIENT DETAILS Name: Brianna Mitchell Age: 67 y.o. Sex: female Date of Birth: 02/12/1956 Admit Date: 12/22/2022 Admitting Physician Hillary Bow, DO ZOX:WRUE, Zannie Cove, MD  Brief Summary: Patient is a 67 y.o.  female with history of DM-2, HTN, hypothyroidism, chronic pain syndrome on narcotics-who presented to the hospital on 11/6 with hypoglycemia (CBG 34 per EMS)/altered mental status-she was also noted to have vomitus in her mouth-with infiltrates on chest x-ray-thought to have aspiration pneumonia.  She was subsequently admitted to the hospitalist service.  Significant events: 11/6>> admit to [facility]  Significant studies: 11/6>> CXR: Right perihilar opacity-aspiration pneumonitis versus bronchopneumonia 11/7>> x-ray abdomen: Nonobstructive pattern 11/7>> MRI brain: No acute abnormality but Motion degraded exam 11/7>> Spot EEG: No seizures.  Significant microbiology data: 11/7>> blood culture: No growth  Procedures: None  Consults: None  Subjective: Lying/sleeping comfortably earlier this morning-easily aroused-appears to have some amount of perseverance-on repeated redirection-able to answer some questions appropriately.  Objective: Vitals: Blood pressure (!) 149/92, pulse (!) 101, temperature 99.6 F (37.6 C), temperature source Oral, resp. rate 12, height 4\' 10"  (1.473 m), weight 67.6 kg, SpO2 99%.   Exam: Gen Exam:not in any distress HEENT:atraumatic, normocephalic Chest: B/L clear to auscultation anteriorly CVS:S1S2 regular Abdomen:soft non tender, non distended Extremities:no edema Neurology: Non focal Skin: no rash  Pertinent Labs/Radiology:    Latest Ref Rng & Units 12/24/2022    4:35 AM 12/23/2022    5:04 AM 12/22/2022    4:02 PM  CBC  WBC 4.0 - 10.5 K/uL 10.7  14.2    Hemoglobin 12.0 - 15.0 g/dL 45.4  09.8  11.9   Hematocrit 36.0 - 46.0 % 31.0  33.2  40.0   Platelets 150 - 400 K/uL 272  271      Lab  Results  Component Value Date   NA 141 12/24/2022   K 3.9 12/24/2022   CL 107 12/24/2022   CO2 25 12/24/2022      Assessment/Plan: Profound hypoglycemia Symptomatic-altered on initial presentation Managed with supportive care-D10 infusion-CBGs now stable-no longer on D10 infusion Unclear why she became profoundly hypoglycemic-she is on insulin at home-unsure if she missed a meal. In any event-plan is to allow permissive hyperglycemia-will start extra sensitive SSI for now and see how she does  Severe sepsis secondary to aspiration pneumonia Sepsis physiology resolved Cultures negative Appreciate SLP eval-seems to be tolerating diet well Continue Rocephin/Flagyl for now  Acute metabolic encephalopathy Likely primarily due to hypoglycemia-possible some contributions from sepsis due to aspiration pneumonia Mentation seems to have improved (spoke to daughter over the phone-she acknowledges improvement) but speech pattern is abnormal-she seems to have significant amount of perseverance (although it appears to be fluctuant at times).  Unclear if she has some hypoglycemic anoxic injury.  Neuroimaging/EEG stable Will discuss with neurology. SLP for cognitive/language evaluation as well  AKI Hemodynamically mediated in the setting of hypoglycemia/sepsis physiology Improving with supportive care Euvolemic on exam-no role for IV fluids Avoid nephrotoxic agents Follow electrolytes  HTN BP creeping up Resume amlodipine Due to lingering AKI-continue to hold lisinopril  Hypothyroidism TSH 11/6 stable Continue levothyroxine  DM-2 (A1c 8.3 on 11/8) See above Starting SSI Not a candidate for aggressive glycemic control-allow for some amount of permissive hyperglycemia  Minimal elevated troponin No anginal symptoms Insignificant clinically No further workup  required  Bronchial asthma Stable-not in exacerbation Bronchodilators  GERD PPI  Migraine headaches On Aimovig as an  outpatient Topamax on hold until encephalopathy improves further As needed Tylenol for breakthrough headaches  Chronic pain syndrome-on chronic prescription opiate Encephalopathy seems to be slowly improving Opiates held on admission but at risk for withdrawal symptoms Will resume as needed Percocet for now-and continue to hold long-acting narcotics.  Obesity: Estimated body mass index is 31.15 kg/m as calculated from the following:   Height as of this encounter: 4\' 10"  (1.473 m).   Weight as of this encounter: 67.6 kg.   Code status:   Code Status: Full Code   DVT Prophylaxis: enoxaparin (LOVENOX) injection 30 mg Start: 12/23/22 1000   Family Communication: Daughter-Brianna Mitchell-[phone] on 11/8-over the phone   Disposition Plan: Status is: Inpatient Remains inpatient appropriate because: Severity of illness   Planned Discharge Destination:Home health   Diet: Diet Order             Diet Carb Modified Fluid consistency: Thin; Room service appropriate? Yes with Assist  Diet effective now                     Antimicrobial agents: Anti-infectives (From admission, onward)    Start     Dose/Rate Route Frequency Ordered Stop   12/23/22 1800  cefTRIAXone (ROCEPHIN) 2 g in sodium chloride 0.9 % 100 mL IVPB        2 g 200 mL/hr over 30 Minutes Intravenous Every 24 hours 12/23/22 0019 12/28/22 1759   12/23/22 1000  metroNIDAZOLE (FLAGYL) IVPB 500 mg        500 mg 100 mL/hr over 60 Minutes Intravenous Every 12 hours 12/23/22 0937 12/28/22 0959   12/23/22 0100  azithromycin (ZITHROMAX) 500 mg in dextrose 5 % 250 mL IVPB  Status:  Discontinued        500 mg 250 mL/hr over 60 Minutes Intravenous Every 24 hours 12/23/22 0019 12/23/22 0936   12/22/22 1915  cefTRIAXone (ROCEPHIN) 2 g in sodium chloride 0.9 % 100 mL IVPB        2 g 200 mL/hr over 30 Minutes Intravenous  Once 12/22/22 1915 12/22/22 2008        MEDICATIONS: Scheduled Meds:  enoxaparin (LOVENOX)  injection  30 mg Subcutaneous Q24H   feeding supplement  237 mL Oral BID BM   topiramate  100 mg Oral BID   Continuous Infusions:  cefTRIAXone (ROCEPHIN)  IV 2 g (12/23/22 2129)   metronidazole 500 mg (12/24/22 0923)   PRN Meds:.   I have personally reviewed following labs and imaging studies  LABORATORY DATA: CBC: Recent Labs  Lab 12/22/22 1541 12/22/22 1602 12/23/22 0504 12/24/22 0435  WBC 8.4  --  14.2* 10.7*  NEUTROABS 6.9  --   --   --   HGB 11.9* 13.6 10.7* 10.0*  HCT 39.0 40.0 33.2* 31.0*  MCV 90.3  --  87.6 88.6  PLT 309  --  271 272    Basic Metabolic Panel: Recent Labs  Lab 12/22/22 1541 12/22/22 1602 12/23/22 0504 12/24/22 0435  NA 135 136 132* 141  K 3.1* 3.2* 3.3* 3.9  CL 96*  --  98 107  CO2 26  --  24 25  GLUCOSE 185*  --  155* 128*  BUN 25*  --  22 17  CREATININE 1.73*  --  1.62* 1.46*  CALCIUM 9.5  --  9.0 8.9    GFR: Estimated Creatinine Clearance: 30 mL/min (  A) (by C-G formula based on SCr of 1.46 mg/dL (H)).  Liver Function Tests: Recent Labs  Lab 12/22/22 1541  AST 25  ALT 17  ALKPHOS 53  BILITOT 0.7  PROT 6.5  ALBUMIN 3.4*   Recent Labs  Lab 12/22/22 1541  LIPASE 33   Recent Labs  Lab 12/22/22 1525  AMMONIA <10    Coagulation Profile: No results for input(s): "INR", "PROTIME" in the last 168 hours.  Cardiac Enzymes: No results for input(s): "CKTOTAL", "CKMB", "CKMBINDEX", "TROPONINI" in the last 168 hours.  BNP (last 3 results) No results for input(s): "PROBNP" in the last 8760 hours.  Lipid Profile: No results for input(s): "CHOL", "HDL", "LDLCALC", "TRIG", "CHOLHDL", "LDLDIRECT" in the last 72 hours.  Thyroid Function Tests: Recent Labs    12/22/22 1626  TSH 2.577  FREET4 0.90  T3FREE 2.0    Anemia Panel: No results for input(s): "VITAMINB12", "FOLATE", "FERRITIN", "TIBC", "IRON", "RETICCTPCT" in the last 72 hours.  Urine analysis:    Component Value Date/Time   COLORURINE YELLOW 12/22/2022 1554    APPEARANCEUR HAZY (A) 12/22/2022 1554   LABSPEC 1.025 12/22/2022 1554   PHURINE 5.0 12/22/2022 1554   GLUCOSEU 50 (A) 12/22/2022 1554   HGBUR SMALL (A) 12/22/2022 1554   BILIRUBINUR NEGATIVE 12/22/2022 1554   BILIRUBINUR negative 03/08/2018 1405   KETONESUR NEGATIVE 12/22/2022 1554   PROTEINUR >=300 (A) 12/22/2022 1554   UROBILINOGEN 0.2 03/08/2018 1405   UROBILINOGEN 0.2 12/02/2009 1134   NITRITE NEGATIVE 12/22/2022 1554   LEUKOCYTESUR NEGATIVE 12/22/2022 1554    Sepsis Labs: Lactic Acid, Venous    Component Value Date/Time   LATICACIDVEN 3.1 (HH) 12/22/2022 1907    MICROBIOLOGY: Recent Results (from the past 240 hour(s))  Blood culture (routine x 2)     Status: None (Preliminary result)   Collection Time: 12/22/22  3:25 PM   Specimen: BLOOD  Result Value Ref Range Status   Specimen Description BLOOD RIGHT ANTECUBITAL  Final   Special Requests   Final    BOTTLES DRAWN AEROBIC AND ANAEROBIC Blood Culture adequate volume   Culture   Final    NO GROWTH 2 DAYS Performed at [facility], [address] [place], [place] [address]    Report Status PENDING  Incomplete  Blood culture (routine x 2)     Status: None (Preliminary result)   Collection Time: 12/23/22 12:41 AM   Specimen: BLOOD LEFT ARM  Result Value Ref Range Status   Specimen Description BLOOD LEFT ARM  Final   Special Requests   Final    BOTTLES DRAWN AEROBIC AND ANAEROBIC Blood Culture adequate volume   Culture   Final    NO GROWTH 1 DAY Performed at [facility], [address], [place], [place] [address]    Report Status PENDING  Incomplete    RADIOLOGY STUDIES/RESULTS: EEG adult  Result Date: 12/23/2022 Charlsie Quest, MD     12/23/2022 10:34 AM Patient Name: BYRDIE VOEGELE MRN: [id number] Epilepsy Attending: Charlsie Quest Referring Physician/Provider: Hillary Bow, DO Date: 12/23/2022 Duration: 24.22 mins Patient history: 67yo F with ams getting eeg to evaluate for seizure Level of  alertness: Awake AEDs during EEG study: TPM Technical aspects: This EEG study was done with scalp electrodes positioned according to the 10-20 International system of electrode placement. Electrical activity was reviewed with band pass filter of 1-70Hz , sensitivity of 7 uV/mm, display speed of 60mm/sec with a 60Hz  notched filter applied as appropriate. EEG data  were recorded continuously and digitally stored.  Video monitoring was available and reviewed as appropriate. Description: The posterior dominant rhythm consists of 7 Hz activity of moderate voltage (25-35 uV) seen predominantly in posterior head regions, symmetric and reactive to eye opening and eye closing. EEG showed intermittent generalized 3 to 6 Hz theta-delta slowing. Hyperventilation and photic stimulation were not performed.   ABNORMALITY - Intermittent slow, generalized IMPRESSION: This study is suggestive of mild diffuse encephalopathy. No seizures or epileptiform discharges were seen throughout the recording. Charlsie Quest   MR BRAIN WO CONTRAST  Result Date: 12/23/2022 CLINICAL DATA:  Altered mental status EXAM: MRI HEAD WITHOUT CONTRAST TECHNIQUE: Multiplanar, multiecho pulse sequences of the brain and surrounding structures were obtained without intravenous contrast. COMPARISON:  None Available. FINDINGS: Brain: No acute infarct, mass effect or extra-axial collection. No chronic microhemorrhage or siderosis. There is multifocal hyperintense T2-weighted signal within the white matter. Parenchymal volume and CSF spaces are normal. Old left basal ganglia small vessel infarct. Vascular: Normal flow voids. Skull and upper cervical spine: Normal marrow signal. Sinuses/Orbits: Negative. Other: Markedly motion degraded examination. IMPRESSION: 1. Markedly motion degraded examination. 2. No acute intracranial abnormality. 3. Old left basal ganglia small vessel infarct and findings of chronic small vessel ischemia. Electronically Signed   By:  Deatra Robinson M.D.   On: 12/23/2022 03:54   DG Abd 1 View  Result Date: 12/23/2022 CLINICAL DATA:  Altered mental status, pneumonia, and encephalopathy. Vomiting, suspected aspiration. EXAM: ABDOMEN - 1 VIEW COMPARISON:  01/06/2013. FINDINGS: The bowel gas pattern is normal. Cholecystectomy clips are present in the right upper quadrant. Surgical clips are noted in the left lower quadrant. No radio-opaque calculi. The lung bases are clear. Lumbar spinal fusion hardware is noted at L3-L4. IMPRESSION: Nonobstructive bowel-gas pattern. Electronically Signed   By: Thornell Sartorius M.D.   On: 12/23/2022 02:09   CT HEAD WO CONTRAST ( )  Result Date: 12/22/2022 CLINICAL DATA:  Delirium EXAM: CT HEAD WITHOUT CONTRAST TECHNIQUE: Contiguous axial images were obtained from the base of the skull through the vertex without intravenous contrast. RADIATION DOSE REDUCTION: This exam was performed according to the departmental dose-optimization program which includes automated exposure control, adjustment of the mA and/or kV according to patient size and/or use of iterative reconstruction technique. COMPARISON:  11/21/2011 FINDINGS: Brain: No evidence of acute infarction, hemorrhage, mass, mass effect, or midline shift. No hydrocephalus or extra-axial fluid collection. Vascular: No hyperdense vessel. Skull: Negative for fracture or focal lesion. Sinuses/Orbits: No acute finding. Other: The mastoid air cells are well aerated. IMPRESSION: No acute intracranial process. Electronically Signed   By: Wiliam Ke M.D.   On: 12/22/2022 19:01   DG Chest Portable 1 View  Result Date: 12/22/2022 CLINICAL DATA:  Altered mental status EXAM: PORTABLE CHEST 1 VIEW COMPARISON:  11/01/2012 FINDINGS: Atherosclerotic calcification of the aortic arch. The hazy right perihilar opacity and associated airway thickening, cannot exclude bronchopneumonia or aspiration pneumonitis. The left lung appears clear. Borderline enlargement of the  cardiopericardial silhouette. No acute bony findings. IMPRESSION: 1. Hazy right perihilar opacity and associated airway thickening, cannot exclude bronchopneumonia or aspiration pneumonitis. 2. Borderline enlargement of the cardiopericardial silhouette. 3. Aortic Atherosclerosis (ICD10-I70.0). Electronically Signed   By: Gaylyn Rong M.D.   On: 12/22/2022 18:43     LOS: 1 day   Jeoffrey Massed, MD  [facility]    To contact the attending provider between 7A-7P or the covering provider during after hours 7P-7A, please log into the web site  [email or web address] and access using universal  password for that web site. If you do not have the password, please call the hospital operator.  12/24/2022, 11:20 AM

## 2022-12-24 NOTE — Progress Notes (Signed)
MEWS Progress Note  Patient Details Name: Brianna Mitchell MRN: [id number] DOB: 02-07-1956 Today's Date: 12/24/2022   MEWS Flowsheet Documentation:  Assess: MEWS Score Temp: 99.3 F (37.4 C) BP: (!) 164/93 MAP (mmHg): 109 Pulse Rate: (!) 115 ECG Heart Rate: (!) 115 Resp: 20 Level of Consciousness: Alert SpO2: 100 % O2 Device: Room Air O2 Flow Rate (L/min): 4 L/min Assess: MEWS Score MEWS Temp: 0 MEWS Systolic: 0 MEWS Pulse: 2 MEWS RR: 0 MEWS LOC: 0 MEWS Score: 2 MEWS Score Color: Yellow Assess: SIRS CRITERIA SIRS Temperature : 0 SIRS Respirations : 0 SIRS Pulse: 1 SIRS WBC: 0 SIRS Score Sum : 1 SIRS Temperature : 0 SIRS Pulse: 1 SIRS Respirations : 0 SIRS WBC: 0 SIRS Score Sum : 1 Assess: if the MEWS score is Yellow or Red Were vital signs accurate and taken at a resting state?: Yes Does the patient meet 2 or more of the SIRS criteria?: Yes Does the patient have a confirmed or suspected source of infection?: Yes MEWS guidelines implemented : Yes, yellow Treat MEWS Interventions: Considered administering scheduled or prn medications/treatments as ordered Take Vital Signs Increase Vital Sign Frequency : Yellow: Q2hr x1, continue Q4hrs until patient remains green for 12hrs Escalate MEWS: Escalate: Yellow: Discuss with charge nurse and consider notifying provider and/or RRT        Elvera Maria 12/24/2022, 1:46 PM

## 2022-12-24 NOTE — Plan of Care (Signed)

## 2022-12-24 NOTE — Evaluation (Signed)
Physical Therapy Evaluation Patient Details Name: CIARRAH PETTERSSON MRN: [id number] DOB: 12-15-1955 Today's Date: 12/24/2022  History of Present Illness  67 yo female presenting with AMS found to have severe sepsis secondary to aspiration pneumonia. PMH asthma, carpal tunnel, DM II, GERD, HTN, thyroid disease, cholecystectomy.  Clinical Impression  Pt presents with admitting diagnosis above. Co-treat with OT. Pt today was able to ambulate short distance in hallway with no AD CGA. Pt ambulates fairly well however remains limited by cognitive deficits as pt was only A&Ox1 today. Recommend HHPT upon however anticipate once cognition improves pt may not need follow up PT. PT will continue to follow.       If plan is discharge home, recommend the following: A little help with walking and/or transfers;A little help with bathing/dressing/bathroom;Assistance with cooking/housework;Direct supervision/assist for medications management;Direct supervision/assist for financial management;Assist for transportation;Help with stairs or ramp for entrance;Supervision due to cognitive status   Can travel by private vehicle        Equipment Recommendations None recommended by PT  Recommendations for Other Services       Functional Status Assessment Patient has had a recent decline in their functional status and demonstrates the ability to make significant improvements in function in a reasonable and predictable amount of time.     Precautions / Restrictions Precautions Precautions: Fall Restrictions Weight Bearing Restrictions: No      Mobility  Bed Mobility               General bed mobility comments: Up in recliner on arrival    Transfers Overall transfer level: Needs assistance Equipment used: None Transfers: Sit to/from Stand Sit to Stand: Contact guard assist                Ambulation/Gait Ambulation/Gait assistance: Contact guard assist Gait Distance (Feet): 25  Feet Assistive device: None Gait Pattern/deviations: Decreased stride length, Step-through pattern, Drifts right/left Gait velocity: decreased     General Gait Details: Pt with slow steady gait. no LOB noted however does tend to drift L/R. Pt would often stop ambulating when talking and required cues to stay on task.  Stairs            Wheelchair Mobility     Tilt Bed    Modified Rankin (Stroke Patients Only)       Balance Overall balance assessment: Needs assistance   Sitting balance-Leahy Scale: Fair Sitting balance - Comments: in recliner not fully assessed   Standing balance support: No upper extremity supported Standing balance-Leahy Scale: Fair                               Pertinent Vitals/Pain Pain Assessment Faces Pain Scale: No hurt    Home Living Family/patient expects to be discharged to:: Private residence Living Arrangements: Alone   Type of Home: Apartment             Additional Comments: Unable to obain home info from pt. Responds by stating her name or something irrelevant to question    Prior Function Prior Level of Function : Patient poor historian/Family not available             Mobility Comments: Pt reports ind no AD ADLs Comments: Pt reports ind     Extremity/Trunk Assessment   Upper Extremity Assessment Upper Extremity Assessment: Overall WFL for tasks assessed    Lower Extremity Assessment Lower Extremity Assessment: Overall WFL for tasks assessed  Cervical / Trunk Assessment Cervical / Trunk Assessment: Normal  Communication   Communication Communication: No apparent difficulties Cueing Techniques: Verbal cues;Gestural cues;Tactile cues  Cognition Arousal: Alert Behavior During Therapy: WFL for tasks assessed/performed Overall Cognitive Status: No family/caregiver present to determine baseline cognitive functioning                                 General Comments: Pt continiously  stating her name when asked orientation questions. Perseverating on previous questions when asked a new one, "stuttering" on/off during conversations. Follows commands with increased time, slow processing        General Comments General comments (skin integrity, edema, etc.): HR 120 at rest and up to 137 when ambulating.    Exercises     Assessment/Plan    PT Assessment Patient needs continued PT services  PT Problem List Decreased strength;Decreased range of motion;Decreased activity tolerance;Decreased balance;Decreased mobility;Decreased coordination;Decreased cognition;Decreased knowledge of use of DME;Decreased safety awareness;Decreased knowledge of precautions;Cardiopulmonary status limiting activity       PT Treatment Interventions DME instruction;Gait training;Stair training;Functional mobility training;Therapeutic activities;Therapeutic exercise;Balance training;Neuromuscular re-education;Cognitive remediation;Patient/family education    PT Goals (Current goals can be found in the Care Plan section)  Acute Rehab PT Goals Patient Stated Goal: to go home PT Goal Formulation: With patient Time For Goal Achievement: 01/07/23 Potential to Achieve Goals: Good    Frequency Min 1X/week     Co-evaluation PT/OT/SLP Co-Evaluation/Treatment: Yes Reason for Co-Treatment: Complexity of the patient's impairments (multi-system involvement);Necessary to address cognition/behavior during functional activity PT goals addressed during session: Mobility/safety with mobility;Balance OT goals addressed during session: ADL's and self-care       AM-PAC PT "6 Clicks" Mobility  Outcome Measure Help needed turning from your back to your side while in a flat bed without using bedrails?: A Little Help needed moving from lying on your back to sitting on the side of a flat bed without using bedrails?: A Little Help needed moving to and from a bed to a chair (including a wheelchair)?: A  Little Help needed standing up from a chair using your arms (e.g., wheelchair or bedside chair)?: A Little Help needed to walk in hospital room?: A Little Help needed climbing 3-5 steps with a railing? : A Little 6 Click Score: 18    End of Session Equipment Utilized During Treatment: Gait belt Activity Tolerance: Patient tolerated treatment well Patient left: in chair;with call bell/phone within reach;with chair alarm set Nurse Communication: Mobility status PT Visit Diagnosis: Other abnormalities of gait and mobility (R26.89)    Time: 9518-8416 PT Time Calculation (min) (ACUTE ONLY): 24 min   Charges:   PT Evaluation $PT Eval Moderate Complexity: 1 Mod   PT General Charges $$ ACUTE PT VISIT: 1 Visit         Shela Nevin, PT, DPT Acute Rehab Services [phone]   Gladys Damme 12/24/2022, 3:38 PM

## 2022-12-24 NOTE — Evaluation (Signed)
Occupational Therapy Evaluation Patient Details Name: Brianna Mitchell MRN: [id number] DOB: Jul 06, 1955 Today's Date: 12/24/2022   History of Present Illness 67 yo female presenting with AMS found to have severe sepsis secondary to aspiration pneumonia. PMH asthma, carpal tunnel, DM II, GERD, HTN, thyroid disease, cholecystectomy.   Clinical Impression   Pt admitted for above, unsure of baseline cognition but pt perseverating and demonstrating tangential speech, stutters on/off during session.  Pt did well with simple dual tasking, ambulated with CGA. Pt completing ADLs with Max A to setup/CGA. Based on her current functioning pt requires 24/7 supervision. Current OT recs left for SNF but pt cognition could potentially improve while in acute stay. OT to continue to follow pt acutely to address listed deficits and help transition to next level of care.      If plan is discharge home, recommend the following: Supervision due to cognitive status;Direct supervision/assist for medications management;Direct supervision/assist for financial management;Assistance with cooking/housework    Functional Status Assessment  Patient has had a recent decline in their functional status and demonstrates the ability to make significant improvements in function in a reasonable and predictable amount of time.  Equipment Recommendations  None recommended by OT    Recommendations for Other Services       Precautions / Restrictions Precautions Precautions: Fall Restrictions Weight Bearing Restrictions: No      Mobility Bed Mobility               General bed mobility comments: Up in recliner on arrival    Transfers Overall transfer level: Needs assistance Equipment used: None Transfers: Sit to/from Stand Sit to Stand: Contact guard assist                  Balance Overall balance assessment: Needs assistance   Sitting balance-Leahy Scale: Fair Sitting balance - Comments: in recliner not  fully assessed   Standing balance support: No upper extremity supported Standing balance-Leahy Scale: Fair                             ADL either performed or assessed with clinical judgement   ADL Overall ADL's : Needs assistance/impaired Eating/Feeding: Independent;Sitting   Grooming: Sitting;Wash/dry face;Contact guard assist;Set up   Upper Body Bathing: Sitting;Contact guard assist   Lower Body Bathing: Sitting/lateral leans;Contact guard assist   Upper Body Dressing : Sitting;Minimal assistance;Cueing for sequencing   Lower Body Dressing: Sitting/lateral leans;Maximal assistance Lower Body Dressing Details (indicate cue type and reason): attempted to doff/don socks, pt not able to. Could be due to long fingernails Toilet Transfer: Ambulation;Contact guard assist   Toileting- Clothing Manipulation and Hygiene: Contact guard assist;Sit to/from stand       Functional mobility during ADLs: Contact guard assist General ADL Comments: Pt doing well with dual tasking, identifying # of fingers on therapists hand while walking in straight line     Vision         Perception         Praxis         Pertinent Vitals/Pain Pain Assessment Pain Assessment: Faces Faces Pain Scale: No hurt     Extremity/Trunk Assessment Upper Extremity Assessment Upper Extremity Assessment: Overall WFL for tasks assessed   Lower Extremity Assessment Lower Extremity Assessment: Defer to PT evaluation       Communication Communication Communication: No apparent difficulties Cueing Techniques: Verbal cues;Gestural cues;Tactile cues   Cognition Arousal: Alert Behavior During Therapy: [facility] for tasks  assessed/performed Overall Cognitive Status: No family/caregiver present to determine baseline cognitive functioning                                 General Comments: Pt continiously stating her name when asked orientation questions. Perseverating on previous  questions when asked a new one, "stuttering" on/off during conversations. Follows commands with increased time, slow processing     General Comments  VSS on RA    Exercises     Shoulder Instructions      Home Living Family/patient expects to be discharged to:: Private residence Living Arrangements: Alone   Type of Home: Apartment                           Additional Comments: Unable to obain home info from pt. Responds by stating her name or something irrelevant to question      Prior Functioning/Environment Prior Level of Function : Patient poor historian/Family not available             Mobility Comments: Pt reports ind no AD ADLs Comments: Pt reports ind        OT Problem List: Impaired balance (sitting and/or standing);Decreased cognition      OT Treatment/Interventions: Self-care/ADL training;Cognitive remediation/compensation;Balance training;Therapeutic exercise;Therapeutic activities;Patient/family education    OT Goals(Current goals can be found in the care plan section) Acute Rehab OT Goals Patient Stated Goal: none stated OT Goal Formulation: Patient unable to participate in goal setting Time For Goal Achievement: 01/07/23 Potential to Achieve Goals: Good ADL Goals Pt Will Perform Grooming: with supervision;standing Pt Will Perform Lower Body Dressing: sitting/lateral leans;sit to/from stand;with set-up Pt Will Transfer to Toilet: with supervision;ambulating Additional ADL Goal #1: Pt will demonstrate improved attention span by moving on to next task 80% of the time once finished with ADL  OT Frequency: Min 1X/week    Co-evaluation PT/OT/SLP Co-Evaluation/Treatment: Yes Reason for Co-Treatment: Complexity of the patient's impairments (multi-system involvement);Necessary to address cognition/behavior during functional activity PT goals addressed during session: Mobility/safety with mobility;Balance OT goals addressed during session: ADL's  and self-care      AM-PAC OT "6 Clicks" Daily Activity     Outcome Measure Help from another person eating meals?: None Help from another person taking care of personal grooming?: A Little Help from another person toileting, which includes using toliet, bedpan, or urinal?: A Little Help from another person bathing (including washing, rinsing, drying)?: A Little Help from another person to put on and taking off regular upper body clothing?: A Little Help from another person to put on and taking off regular lower body clothing?: A Lot 6 Click Score: 18   End of Session Equipment Utilized During Treatment: Gait belt Nurse Communication: Mobility status (+1)  Activity Tolerance: Patient tolerated treatment well Patient left: in chair;with call bell/phone within reach;with chair alarm set  OT Visit Diagnosis: Unsteadiness on feet (R26.81);Other symptoms and signs involving cognitive function                Time: 2130-8657 OT Time Calculation (min): 23 min Charges:  OT General Charges $OT Visit: 1 Visit OT Evaluation $OT Eval Low Complexity: 1 Low  12/24/2022  AB, OTR/L  Acute Rehabilitation Services  Office: [phone]   Tristan Schroeder 12/24/2022, 12:44 PM

## 2022-12-24 NOTE — Evaluation (Signed)
Speech Language Pathology Evaluation Patient Details Name: Brianna Mitchell MRN: [id number] DOB: 04/15/1955 Today's Date: 12/24/2022 Time: 0905-0920 SLP Time Calculation (min) (ACUTE ONLY): 15 min  Problem List:  Patient Active Problem List   Diagnosis Date Noted   AKI (acute kidney injury) (HCC) 12/23/2022   Chronic prescription opiate use 12/23/2022   Acute encephalopathy 12/23/2022   Severe sepsis (HCC) 12/23/2022   Elevated troponin 12/23/2022   Sepsis due to pneumonia (HCC) 12/23/2022   Aspiration pneumonia (HCC) 12/22/2022   Essential hypertension 03/09/2018   Other insomnia 03/09/2018   follows with endocrinology 03/09/2018   Hypothyroidism 03/09/2018   DM2 (diabetes mellitus, type 2) (HCC) 12/21/2017   Abdominal pain 12/21/2017   Shoulder arthritis 02/03/2016   Past Medical History:  Past Medical History:  Diagnosis Date   Anemia    Asthma    Back pain    02/2019 getting injections   Carpal tunnel syndrome    Diabetes mellitus    type 2    Fibroid tumor    Ganglion cyst    GERD (gastroesophageal reflux disease)    will see Dr. Kathrynn Running 04/2018   Headache(784.0)    Heart murmur    saw dr. Jacinto Halim (only sees if needed, not seen since 2011)   Hypertension    dr Jacinto Halim      Hypothyroidism    Shortness of breath    on exertion    Shoulder pain    Tendonitis    left shoulder biceps tendonitis, acromioclavicular osteoarthritis   Tennis elbow    Thyroid disease    Past Surgical History:  Past Surgical History:  Procedure Laterality Date   ABDOMINAL HYSTERECTOMY     ANTERIOR LAT LUMBAR FUSION N/A 01/03/2013   Procedure: X LIF (Extreme Lateral Interbody Fusion) L3-L4,  (1 LEVEL) ;  Surgeon: Venita Lick, MD;  Location: [facility] OR;  Service: Orthopedics;  Laterality: N/A;   BACK SURGERY     x5   CARDIAC CATHETERIZATION     CHOLECYSTECTOMY     COLONOSCOPY  04/18/2017   Dr Charna Elizabeth ; [facility]    EYE SURGERY  05/23/2017   laser of right eye, to have  left eye done on 06-13-17 - for cataracts    KNEE ARTHROSCOPY Right 04/2014   LAPAROSCOPY N/A 05/30/2017   Procedure: LAPAROSCOPY DIAGNOSTIC;  Surgeon: Gaynelle Adu, MD;  Location: [facility] ORS;  Service: General;  Laterality: N/A;   LEFT HEART CATHETERIZATION WITH CORONARY ANGIOGRAM N/A 04/27/2011   Procedure: LEFT HEART CATHETERIZATION WITH CORONARY ANGIOGRAM;  Surgeon: Pamella Pert, MD;  Location: [facility] CATH LAB;  Service: Cardiovascular;  Laterality: N/A;   pelvic bone removed     ROTATOR CUFF REPAIR Left    x3 surgeries; Dr Lorin Picket [facility]    SHOULDER ARTHROSCOPY WITH SUBACROMIAL DECOMPRESSION, ROTATOR CUFF REPAIR AND BICEP TENDON REPAIR Left 02/03/2016   Procedure: SHOULDER ARTHROSCOPY WITH SUBACROMIAL DECOMPRESSION, DEBRIDEMENT, BICEPS TENODESIS, DISTAL CLAVICLE EXCISION.;  Surgeon: Cammy Copa, MD;  Location: [facility] OR;  Service: Orthopedics;  Laterality: Left;   THYROID SURGERY     LUMP REMOVED   TUBAL LIGATION     HPI:  Brianna Mitchell is a 65 yof who presented to ED via EMS after being found with profound hypoglycemia. Dx severe sepsis secondary to aspiration pna, metabolic encephalopathy, AKI.  MRI negative for acute abnormalities.   Assessment / Plan / Recommendation Clinical Impression  Pt seen for cognitive-linguistic evaluation in setting of severe sepsis and encephalopathy. Assessment completed via informal  means. Pt presents with cognitive-linguistic deficits affecting all domains of cognition. Pt oriented to self only. Pt with inconsistent ability to answer personally relevant yes/no questions and follow 1-step commands. Few instances of volitional speech which were limited to single words/short phrases and often perseverative in nature. Flat affect. Pt feeding self upon SLP entrance to room with table arm's length away. Pt unable to demonstrate use of call bell. Recommend ongoing SLP services acute/post-acute for functional/dynamic cognitive-linguistic evaluation and  treatment. Anticipate need for frequent/constant supervision at d/c.    SLP Assessment  SLP Recommendation/Assessment: Patient needs continued Speech Lanaguage Pathology Services SLP Visit Diagnosis: Cognitive communication deficit (R41.841)    Recommendations for follow up therapy are one component of a multi-disciplinary discharge planning process, led by the attending physician.  Recommendations may be updated based on patient status, additional functional criteria and insurance authorization.    Follow Up Recommendations  Skilled nursing-short term rehab (<3 hours/day)    Assistance Recommended at Discharge  Frequent or constant Supervision/Assistance  Functional Status Assessment Patient has had a recent decline in their functional status and demonstrates the ability to make significant improvements in function in a reasonable and predictable amount of time.  Frequency and Duration min 2x/week  2 weeks      SLP Evaluation Cognition  Overall Cognitive Status: Impaired/Different from baseline Arousal/Alertness: Awake/alert Orientation Level: Oriented to person;Disoriented to place;Disoriented to time;Disoriented to situation Attention: Sustained Sustained Attention: Impaired Sustained Attention Impairment: Verbal basic Memory: Impaired Memory Impairment: Storage deficit;Retrieval deficit Awareness: Impaired Awareness Impairment: Intellectual impairment Problem Solving: Impaired Problem Solving Impairment: Functional basic Behaviors: Perseveration Safety/Judgment: Impaired       Comprehension  Auditory Comprehension Overall Auditory Comprehension: Impaired Yes/No Questions: Impaired Basic Biographical Questions: 26-50% accurate Commands: Impaired One Step Basic Commands: 25-49% accurate Conversation: Simple Interfering Components: Attention;Processing speed EffectiveTechniques: Extra processing time;Repetition;Visual/Gestural cues Visual  Recognition/Discrimination Discrimination: Not tested Reading Comprehension Reading Status: Not tested    Expression Expression Primary Mode of Expression: Verbal Verbal Expression Overall Verbal Expression: Impaired Initiation: Impaired Automatic Speech: Name Level of Generative/Spontaneous Verbalization: Phrase Repetition:  (DNT) Naming:  (DNT) Pragmatics: Impairment Impairments: Abnormal affect;Topic maintenance;Eye contact Interfering Components: Attention Written Expression Dominant Hand: Right Written Expression: Not tested   Oral / Motor  Oral Motor/Sensory Function Overall Oral Motor/Sensory Function: Within functional limits Motor Speech Overall Motor Speech: Appears within functional limits for tasks assessed Respiration: Within functional limits Phonation: Normal Resonance: Within functional limits Articulation: Within functional limitis Intelligibility: Intelligible Motor Planning: Witnin functional limits Motor Speech Errors: Not applicable           Clyde Canterbury, M.S., CCC-SLP Speech-Language Pathologist Secure Chat Preferred  O: [phone]  Woodroe Chen 12/24/2022, 9:41 AM

## 2022-12-24 NOTE — Consult Note (Signed)
NEUROLOGY CONSULT NOTE   Date of service: December 24, 2022 Patient Name: Brianna Mitchell MRN:  [id number] DOB:  [birth year] Chief Complaint: AMS Requesting Provider: Maretta Bees, MD  History of Present Illness  Brianna Mitchell is a 67 y.o. female  has a past medical history of Anemia, Asthma, Back pain, Carpal tunnel syndrome, Diabetes mellitus, Fibroid tumor, Ganglion cyst, GERD (gastroesophageal reflux disease), Headache(784.0), Heart murmur, Hypertension, Hypothyroidism, Shortness of breath, Shoulder pain, Tendonitis, Tennis elbow, and Thyroid disease. who originally presented with altered mental status in the setting of hypoglycemia and aspiration pneumonia.  Per patient's daughter, she is normally alert and oriented x 4 and able to handle her own affairs.  Patient's daughter did state that she has some episodes of confusion if she gets hypoglycemic but that these resolved when she eats.  Patient's daughter stated that she has had no memory problems and no signs of cognitive decline over the last few months.  On 11/6 when patient was admitted to the hospital, she was found at home with altered mental status and hypoglycemia with a CBG of 34.  She was bagged for about 10 minutes by EMS while en route to the hospital.  Since then, per her family, patient's mental status has improved, but she continues to be disoriented to place and certain aspects of her situation and perseverates on different words and phrases.  ROS   Unable to ascertain due to altered mental status  Past History   Past Medical History:  Diagnosis Date   Anemia    Asthma    Back pain    02/2019 getting injections   Carpal tunnel syndrome    Diabetes mellitus    type 2    Fibroid tumor    Ganglion cyst    GERD (gastroesophageal reflux disease)    will see Dr. Kathrynn Running 04/2018   Headache(784.0)    Heart murmur    saw dr. Jacinto Halim (only sees if needed, not seen since 2011)   Hypertension    dr Jacinto Halim      Hypothyroidism     Shortness of breath    on exertion    Shoulder pain    Tendonitis    left shoulder biceps tendonitis, acromioclavicular osteoarthritis   Tennis elbow    Thyroid disease     Past Surgical History:  Procedure Laterality Date   ABDOMINAL HYSTERECTOMY     ANTERIOR LAT LUMBAR FUSION N/A 01/03/2013   Procedure: X LIF (Extreme Lateral Interbody Fusion) L3-L4,  (1 LEVEL) ;  Surgeon: Venita Lick, MD;  Location: [facility] OR;  Service: Orthopedics;  Laterality: N/A;   BACK SURGERY     x5   CARDIAC CATHETERIZATION     CHOLECYSTECTOMY     COLONOSCOPY  04/18/2017   Dr Charna Elizabeth ; [facility]    EYE SURGERY  05/23/2017   laser of right eye, to have left eye done on 06-13-17 - for cataracts    KNEE ARTHROSCOPY Right 04/2014   LAPAROSCOPY N/A 05/30/2017   Procedure: LAPAROSCOPY DIAGNOSTIC;  Surgeon: Gaynelle Adu, MD;  Location: [facility] ORS;  Service: General;  Laterality: N/A;   LEFT HEART CATHETERIZATION WITH CORONARY ANGIOGRAM N/A 04/27/2011   Procedure: LEFT HEART CATHETERIZATION WITH CORONARY ANGIOGRAM;  Surgeon: Pamella Pert, MD;  Location: [facility] CATH LAB;  Service: Cardiovascular;  Laterality: N/A;   pelvic bone removed     ROTATOR CUFF REPAIR Left    x3 surgeries; Dr Lorin Picket [facility]    SHOULDER ARTHROSCOPY  WITH SUBACROMIAL DECOMPRESSION, ROTATOR CUFF REPAIR AND BICEP TENDON REPAIR Left 02/03/2016   Procedure: SHOULDER ARTHROSCOPY WITH SUBACROMIAL DECOMPRESSION, DEBRIDEMENT, BICEPS TENODESIS, DISTAL CLAVICLE EXCISION.;  Surgeon: Cammy Copa, MD;  Location: [facility] OR;  Service: Orthopedics;  Laterality: Left;   THYROID SURGERY     LUMP REMOVED   TUBAL LIGATION      Family History: Family History  Problem Relation Age of Onset   Heart disease Mother    Heart disease Father    Breast cancer Sister    Heart disease Sister    Heart disease Brother    Heart disease Paternal Uncle     Social History  reports that she has never smoked. She has never used  smokeless tobacco. She reports that she does not drink alcohol and does not use drugs.  Allergies  Allergen Reactions   Aspirin Swelling   Sulfa Antibiotics Swelling   Demerol Rash   Penicillins Rash    Medications   Current Facility-Administered Medications:    albuterol (PROVENTIL) (2.5 MG/3ML) 0.083% nebulizer solution 2.5 mg, 2.5 mg, Nebulization, Q6H PRN, Ghimire, Werner Lean, MD   amLODipine (NORVASC) tablet 5 mg, 5 mg, Oral, Daily, Ghimire, Shanker M, MD, 5 mg at 12/24/22 1232   cefTRIAXone (ROCEPHIN) 2 g in sodium chloride 0.9 % 100 mL IVPB, 2 g, Intravenous, Q24H, Gardner, Jared M, DO, Last Rate: 200 mL/hr at 12/23/22 2129, 2 g at 12/23/22 2129   enoxaparin (LOVENOX) injection 30 mg, 30 mg, Subcutaneous, Q24H, Julian Reil, Jared M, DO, 30 mg at 12/24/22 0919   feeding supplement (ENSURE ENLIVE / ENSURE PLUS) liquid 237 mL, 237 mL, Oral, BID BM, Ghimire, Shanker M, MD, 237 mL at 12/24/22 1233   fluticasone furoate-vilanterol (BREO ELLIPTA) 100-25 MCG/ACT 1 puff, 1 puff, Inhalation, Daily, Ghimire, Shanker M, MD   insulin aspart (novoLOG) injection 0-6 Units, 0-6 Units, Subcutaneous, TID WC, Ghimire, Werner Lean, MD   levothyroxine (SYNTHROID) tablet 75 mcg, 75 mcg, Oral, Q0600, Maretta Bees, MD, 75 mcg at 12/24/22 1232   metroNIDAZOLE (FLAGYL) IVPB 500 mg, 500 mg, Intravenous, Q12H, Ghimire, Shanker M, MD, Last Rate: 100 mL/hr at 12/24/22 0923, 500 mg at 12/24/22 2952   oxyCODONE-acetaminophen (PERCOCET/ROXICET) 5-325 MG per tablet 1 tablet, 1 tablet, Oral, Q6H PRN, Ghimire, Shanker M, MD   pantoprazole (PROTONIX) EC tablet 40 mg, 40 mg, Oral, Q1200, Ghimire, Werner Lean, MD, 40 mg at 12/24/22 1232   topiramate (TOPAMAX) tablet 100 mg, 100 mg, Oral, BID, Standley Brooking, MD, 100 mg at 12/24/22 1050  Vitals   Vitals:   12/24/22 0200 12/24/22 0400 12/24/22 1001 12/24/22 1200  BP:  (!) 159/87 (!) 149/92 (!) 164/93  Pulse: (!) 105 99 (!) 101 (!) 115  Resp: (!) 22 19 12 20   Temp:     99.3 F (37.4 C)  TempSrc:    Oral  SpO2: 98% 99% 99% 100%  Weight:      Height:        Body mass index is 31.15 kg/m.  Physical Exam   Constitutional: Appears well-developed and well-nourished.  Psych: Affect blunted Eyes: No scleral injection.  HENT: No OP obstruction.  Head: Normocephalic.  Cardiovascular: Normal rate and regular rhythm.  Respiratory: Effort normal, non-labored breathing.  Skin: WDI.   Neurologic Examination    NEURO:  Mental Status: Able to state name, disoriented to place, able to state correct month and when asked why she came to the hospital is able to say that it was because of  low blood sugar.  Able to follow simple and two-step commands but is slow to process.  Can do simple addition but not subtraction, unable to add change, 3 out of 3 registration (with multiple repetitions), 0 out of 3 recall, unable to name any animals with 4 feet.  Sometimes repeats the same sentence several times and has difficulty switching between tasks.  Speech/Language: speech is without dysarthria or aphasia.    Cranial Nerves:  II: PERRL.  III, IV, VI: EOMI. Eyelids elevate symmetrically.  V: Sensation is intact to light touch and symmetrical to face.  VII: Smile is symmetrical.  VIII: hearing intact to voice. IX, X: Phonation is normal.  ZO:[id number] shrug 5/5. XII: tongue is midline without fasciculations. Motor: 5/5 strength to all muscle groups tested.  Tone: is normal and bulk is normal Sensation- Intact to light touch bilaterally.  Coordination: FTN intact bilaterally.No drift.  Gait- deferred    Labs/Imaging/Neurodiagnostic studies   CBC:  Recent Labs  Lab Jan 11, 2023 1541 01/11/23 1602 12/23/22 0504 12/24/22 0435  WBC 8.4  --  14.2* 10.7*  NEUTROABS 6.9  --   --   --   HGB 11.9*   < > 10.7* 10.0*  HCT 39.0   < > 33.2* 31.0*  MCV 90.3  --  87.6 88.6  PLT 309  --  271 272   < > = values in this interval not displayed.    Basic Metabolic Panel:   Lab Results  Component Value Date   NA 141 12/24/2022   K 3.9 12/24/2022   CO2 25 12/24/2022   GLUCOSE 128 (H) 12/24/2022   BUN 17 12/24/2022   CREATININE 1.46 (H) 12/24/2022   CALCIUM 8.9 12/24/2022   GFRNONAA 39 (L) 12/24/2022   GFRAA 50 (L) 07/20/2018    Lipid Panel:  Lab Results  Component Value Date   LDLCALC 81 03/08/2018    HgbA1c:  Lab Results  Component Value Date   HGBA1C 8.3 (H) 12/24/2022    Urine Drug Screen:     Component Value Date/Time   LABOPIA NONE DETECTED 11-Jan-2023 1554   COCAINSCRNUR NONE DETECTED 01-11-23 1554   LABBENZ NONE DETECTED 2023-01-11 1554   AMPHETMU NONE DETECTED 01/11/23 1554   THCU NONE DETECTED 2023/01/11 1554   LABBARB NONE DETECTED Jan 11, 2023 1554     Alcohol Level     Component Value Date/Time   ETH <10 01-11-23 1555    INR  Lab Results  Component Value Date   INR 0.96 01/21/2010    APTT  Lab Results  Component Value Date   APTT 30 12/02/2009    AED levels: No results found for: "PHENYTOIN", "ZONISAMIDE", "LAMOTRIGINE", "LEVETIRACETA"  Ethanol negative Urine drug screen negative TSH 2.577  CT Head without contrast(Personally reviewed): No acute abnormality  MRI Brain(Personally reviewed): Motion degraded exam, no acute abnormality, 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 female  has a past medical history of Anemia, Asthma, Back pain, Carpal tunnel syndrome, Diabetes mellitus, Fibroid tumor, Ganglion cyst, GERD (gastroesophageal reflux disease), Headache(784.0), Heart murmur, Hypertension, Hypothyroidism, Shortness of breath, Shoulder pain, Tendonitis, Tennis elbow, and Thyroid disease.  Who presented originally with altered mental status in the setting of hypoglycemia and aspiration pneumonia.  Per family, patient is normally alert  and oriented and able to handle her own affairs at baseline.  Prior to her  hypoglycemic episode, she was reportedly at mental baseline.  Currently on exam, she is clearly cognitively impaired, unable to state place but when informed she is in the hospital is able to say that she came here because her blood sugar was low.  She is able to follow simple and two-step commands but is slow to switch between tasks and respond to questions.  She does perseverate on answers to different questions in different sentences at times but is able to be redirected.  CT head and MRI brain were negative for acute abnormality, and EEG demonstrated no seizure activity.  Patient's daughter states that sometimes she skips meals, so we will send nutritional labs.  Delirium and toxic metabolic encephalopathy in the setting of aspiration pneumonia could explain patient's presentation.However, given that patient had a blood glucose of 34 on EMS arrival and had to be bagged for about 10 minutes, there is concern for hypoglycemic and hypoxic/ischemic brain injury.  Would monitor patient for an additional 2 days, and if she has not returned to baseline by that time, would repeat MRI as it will be more sensitive at that time for hypoxic ischemic injury.  Recommendations  -Check thiamine, folate B12 and RPR -If patient's mental status does not improve by 11/10, repeat brain MRI to check for hypoxic/ischemic brain injury ______________________________________________________________________  Patient seen by NP and then by MD, MD to edit note as needed.  Signed,  Cortney E Ernestina [place] [facility]    Attending Neurohospitalist Addendum Patient seen and examined with APP/Resident. Agree with the history and physical as documented above. Agree with the plan as documented, which I helped formulate. I have edited the note above to reflect my full findings and recommendations. I have independently reviewed the chart,  obtained history, review of systems and examined the patient.I have personally reviewed pertinent head/neck/spine imaging (CT/MRI). Please feel free to call with any questions.  -- Bing Neighbors, MD [facility] [phone]  If 7pm- 7am, please page neurology on call as listed in AMION.

## 2022-12-24 NOTE — Progress Notes (Signed)
PHARMACY - PHYSICIAN COMMUNICATION CRITICAL VALUE ALERT - BLOOD CULTURE IDENTIFICATION (BCID)  Brianna Mitchell is an 67 y.o. female who presented to [facility] on 12/22/2022 with a chief complaint of AMS  Assessment: 1 of 4 blood cultures with GPR unidentified on BCID  Name of physician (or Provider) Contacted: Doree Fudge, MD  Current antibiotics: ceftriaxone, flagyl  Changes to prescribed antibiotics recommended:  None  No results found for this or any previous visit.  Reece Leader, Colon Flattery, BCCP Clinical Pharmacist  12/24/2022 7:54 PM   [facility] pharmacy phone numbers are listed on [email or web address]

## 2022-12-25 DIAGNOSIS — E11641 Type 2 diabetes mellitus with hypoglycemia with coma: Secondary | ICD-10-CM | POA: Diagnosis not present

## 2022-12-25 DIAGNOSIS — A419 Sepsis, unspecified organism: Secondary | ICD-10-CM | POA: Diagnosis not present

## 2022-12-25 DIAGNOSIS — G9340 Encephalopathy, unspecified: Secondary | ICD-10-CM | POA: Diagnosis not present

## 2022-12-25 DIAGNOSIS — E162 Hypoglycemia, unspecified: Secondary | ICD-10-CM | POA: Diagnosis not present

## 2022-12-25 LAB — BASIC METABOLIC PANEL
Anion gap: 9 (ref 5–15)
BUN: 22 mg/dL (ref 8–23)
CO2: 25 mmol/L (ref 22–32)
Calcium: 9.2 mg/dL (ref 8.9–10.3)
Chloride: 106 mmol/L (ref 98–111)
Creatinine, Ser: 1.45 mg/dL — ABNORMAL HIGH (ref 0.44–1.00)
GFR, Estimated: 39 mL/min — ABNORMAL LOW (ref >60)
Glucose, Bld: 189 mg/dL — ABNORMAL HIGH (ref 70–99)
Potassium: 3.5 mmol/L (ref 3.5–5.1)
Sodium: 140 mmol/L (ref 135–145)

## 2022-12-25 LAB — GLUCOSE, CAPILLARY
Glucose-Capillary: 176 mg/dL — ABNORMAL HIGH (ref 70–99)
Glucose-Capillary: 230 mg/dL — ABNORMAL HIGH (ref 70–99)
Glucose-Capillary: 243 mg/dL — ABNORMAL HIGH (ref 70–99)
Glucose-Capillary: 209 mg/dL — ABNORMAL HIGH (ref 70–99)

## 2022-12-25 LAB — RPR: RPR Ser Ql: NONREACTIVE

## 2022-12-25 LAB — VITAMIN B12: Vitamin B-12: 725 pg/mL (ref 180–914)

## 2022-12-25 LAB — FOLATE: Folate: 15 ng/mL (ref >5.9)

## 2022-12-25 MED ORDER — AMLODIPINE BESYLATE 10 MG PO TABS
10.0000 mg | ORAL_TABLET | Freq: Every day | ORAL | Status: DC
  Administered 2022-12-25 – 2022-12-29 (×5): 10 mg via ORAL
  Filled 2022-12-25 (×5): qty 1

## 2022-12-25 MED ORDER — ENOXAPARIN SODIUM 40 MG/0.4ML IJ SOSY
40.0000 mg | PREFILLED_SYRINGE | INTRAMUSCULAR | Status: DC
  Administered 2022-12-26 – 2022-12-29 (×4): 40 mg via SUBCUTANEOUS
  Filled 2022-12-25 (×4): qty 0.4

## 2022-12-25 NOTE — Progress Notes (Signed)
Occupational Therapy Treatment Patient Details Name: Brianna Mitchell MRN: [id number] DOB: Apr 16, 1955 Today's Date: 12/25/2022   History of present illness 67 yo female presenting with AMS found to have severe sepsis secondary to aspiration pneumonia. PMH asthma, carpal tunnel, DM II, GERD, HTN, thyroid disease, cholecystectomy.   OT comments  Pt with fair progress toward established OT goals. Pt continues to be slightly perseverative with verbal responses and during ADL needing cues to transition from one task to the next. Pt with difficulty verbally expressing self and needing increased time to follow commands. Overall, pt requires CGA for ambulation this session; HR up to 135 with minimal activity. RN aware. Will continue to follow. Due to difficulty word finding have recommended speech eval.       If plan is discharge home, recommend the following:  Supervision due to cognitive status;Direct supervision/assist for medications management;Direct supervision/assist for financial management;Assistance with cooking/housework   Equipment Recommendations  None recommended by OT    Recommendations for Other Services Speech consult    Precautions / Restrictions Precautions Precautions: Fall Restrictions Weight Bearing Restrictions: No       Mobility Bed Mobility Overal bed mobility: Needs Assistance Bed Mobility: Supine to Sit     Supine to sit: Contact guard     General bed mobility comments: cues for seuqencing    Transfers Overall transfer level: Needs assistance Equipment used: None Transfers: Sit to/from Stand Sit to Stand: Contact guard assist                 Balance Overall balance assessment: Needs assistance   Sitting balance-Leahy Scale: Fair     Standing balance support: No upper extremity supported Standing balance-Leahy Scale: Fair                             ADL either performed or assessed with clinical judgement   ADL Overall ADL's :  Needs assistance/impaired                         Toilet Transfer: Ambulation;Contact guard assist   Toileting- Clothing Manipulation and Hygiene: Total assistance;Sit to/from stand Toileting - Clothing Manipulation Details (indicate cue type and reason): requiring BUE for balance     Functional mobility during ADLs: Contact guard assist General ADL Comments: Pt with difficulty following more than one step commands, needing cues for transitioning from one task to the next, esp as it pertains to language.    Extremity/Trunk Assessment              Vision       Perception     Praxis      Cognition Arousal: Alert Behavior During Therapy: WFL for tasks assessed/performed Overall Cognitive Status: No family/caregiver present to determine baseline cognitive functioning                                          Exercises      Shoulder Instructions       General Comments HR 100 at rest and up to 120-135 during mobility. Significantly increased time for mobility, following commands, and transitioning from one task to the next    Pertinent Vitals/ Pain       Pain Assessment Pain Assessment: Faces Faces Pain Scale: No hurt Pain Intervention(s): Monitored during session  Home Living  Prior Functioning/Environment              Frequency  Min 1X/week        Progress Toward Goals  OT Goals(current goals can now be found in the care plan section)  Progress towards OT goals: Progressing toward goals  Acute Rehab OT Goals Patient Stated Goal: go to restroom OT Goal Formulation: With patient Time For Goal Achievement: 01/07/23 Potential to Achieve Goals: Good ADL Goals Pt Will Perform Grooming: with supervision;standing Pt Will Perform Lower Body Dressing: sitting/lateral leans;sit to/from stand;with set-up Pt Will Transfer to Toilet: with supervision;ambulating Additional  ADL Goal #1: Pt will demonstrate improved attention span by moving on to next task 80% of the time once finished with ADL  Plan      Co-evaluation                 AM-PAC OT "6 Clicks" Daily Activity     Outcome Measure   Help from another person eating meals?: None Help from another person taking care of personal grooming?: A Little Help from another person toileting, which includes using toliet, bedpan, or urinal?: A Little Help from another person bathing (including washing, rinsing, drying)?: A Little Help from another person to put on and taking off regular upper body clothing?: A Little Help from another person to put on and taking off regular lower body clothing?: A Lot 6 Click Score: 18    End of Session Equipment Utilized During Treatment: Gait belt  OT Visit Diagnosis: Unsteadiness on feet (R26.81);Other symptoms and signs involving cognitive function   Activity Tolerance Patient tolerated treatment well   Patient Left in chair;with call bell/phone within reach;with chair alarm set   Nurse Communication Mobility status        Time: 1445-1521 OT Time Calculation (min): 36 min  Charges: OT General Charges $OT Visit: 1 Visit OT Treatments $Self Care/Home Management : 23-37 mins  Tyler Deis, OTR/L [facility] Acute Rehabilitation Office: [phone]   Myrla Halsted 12/25/2022, 4:41 PM

## 2022-12-25 NOTE — Plan of Care (Signed)

## 2022-12-25 NOTE — Progress Notes (Signed)
PROGRESS NOTE        PATIENT DETAILS Name: Brianna Mitchell Age: 67 y.o. Sex: female Date of Birth: [birth year] Admit Date: 12/22/2022 Admitting Physician Hillary Bow, DO ZOX:WRUE, Zannie Cove, MD  Brief Summary: Patient is a 67 y.o.  female with history of DM-2, HTN, hypothyroidism, chronic pain syndrome on narcotics-who presented to the hospital on 11/6 with hypoglycemia (CBG 34 per EMS)/altered mental status-she was also noted to have vomitus in her mouth-with infiltrates on chest x-ray-thought to have aspiration pneumonia.  She was subsequently admitted to the hospitalist service.  Significant events: 11/6>> admit to [facility]  Significant studies: 11/6>> CXR: Right perihilar opacity-aspiration pneumonitis versus bronchopneumonia 11/7>> x-ray abdomen: Nonobstructive pattern 11/7>> MRI brain: No acute abnormality but Motion degraded exam 11/7>> Spot EEG: No seizures.  Significant microbiology data: 11/7>> blood culture: Gram-positive rods (likely a contamination)  Procedures: None  Consults: None  Subjective: Somewhat confused at times-but redirectable-speech pattern is unchanged from yesterday.  Objective: Vitals: Blood pressure (!) 157/83, pulse (!) 103, temperature (!) 97.5 F (36.4 C), temperature source Oral, resp. rate 16, height 4\' 10"  (1.473 m), weight 67.6 kg, SpO2 97%.   Exam: Gen Exam:not in any distress HEENT:atraumatic, normocephalic Chest: B/L clear to auscultation anteriorly CVS:S1S2 regular Abdomen:soft non tender, non distended Extremities:no edema Neurology: Non focal Skin: no rash  Pertinent Labs/Radiology:    Latest Ref Rng & Units 12/24/2022    4:35 AM 12/23/2022    5:04 AM 12/22/2022    4:02 PM  CBC  WBC 4.0 - 10.5 K/uL 10.7  14.2    Hemoglobin 12.0 - 15.0 g/dL 45.4  09.8  11.9   Hematocrit 36.0 - 46.0 % 31.0  33.2  40.0   Platelets 150 - 400 K/uL 272  271      Lab Results  Component Value Date   NA 140 12/25/2022    K 3.5 12/25/2022   CL 106 12/25/2022   CO2 25 12/25/2022      Assessment/Plan: Profound hypoglycemia Symptomatic-altered on initial presentation Managed with supportive care-D10 infusion-CBGs now stable-no longer on D10 infusion Unclear why she became profoundly hypoglycemic-she is on insulin at home-unsure if she missed a meal. CBGs now stable-allow permissive hyperglycemia  Severe sepsis secondary to aspiration pneumonia Sepsis physiology resolved Cultures negative Appreciate SLP eval-seems to be tolerating diet well Continue Rocephin/Flagyl for now  Acute metabolic encephalopathy Likely primarily due to hypoglycemia-possible some contributions from sepsis due to aspiration pneumonia.  Since continues to have some amount of speech abnormality/confusion-suspicion that she may have had hypoglycemic anoxic injury.  Appreciate neurology input-plan is to repeat MRI in a few days if no improvement. SLP for cognitive/language evaluation Mobilize with PT/OT Delirium precautions.  AKI on CKD stage 3b AKI likely hemodynamically mediated in the setting of hypoglycemia/sepsis physiology Improving with supportive care-close to usual baseline  HTN BP still elevated-increase Amlodipine to 10 mg Continue to hold Lisinopril   Hypothyroidism TSH 11/6 stable Continue levothyroxine  DM-2 (A1c 8.3 on 11/8) See above-rehypoglycemia Not a candidate for aggressive glycemic control-allow for some amount of permissive hyperglycemia Continue SSI  Recent Labs    12/24/22 1819 12/24/22 2322 12/25/22 0846  GLUCAP 272* 182* 176*     Minimal elevated troponin No anginal symptoms Insignificant clinically No further workup required  Bronchial asthma Stable-not in exacerbation Bronchodilators  GERD PPI  Migraine headaches On  Aimovig as an outpatient Topamax on hold until encephalopathy improves further As needed Tylenol for breakthrough headaches  Chronic pain syndrome-on  chronic prescription opiate Encephalopathy seems to be slowly improving Opiates held on admission but at risk for withdrawal symptoms Continue Percocet for now-and continue to hold long-acting narcotics.  Obesity: Estimated body mass index is 31.15 kg/m as calculated from the following:   Height as of this encounter: 4\' 10"  (1.473 m).   Weight as of this encounter: 67.6 kg.   Code status:   Code Status: Full Code   DVT Prophylaxis: enoxaparin (LOVENOX) injection 30 mg Start: 12/23/22 1000   Family Communication: Daughter-Natasha-[phone] on 11/8-over the phone 11/8   Disposition Plan: Status is: Inpatient Remains inpatient appropriate because: Severity of illness   Planned Discharge Destination:Home health   Diet: Diet Order             Diet Carb Modified Fluid consistency: Thin; Room service appropriate? Yes with Assist  Diet effective now                     Antimicrobial agents: Anti-infectives (From admission, onward)    Start     Dose/Rate Route Frequency Ordered Stop   12/23/22 1800  cefTRIAXone (ROCEPHIN) 2 g in sodium chloride 0.9 % 100 mL IVPB        2 g 200 mL/hr over 30 Minutes Intravenous Every 24 hours 12/23/22 0019 12/28/22 1759   12/23/22 1000  metroNIDAZOLE (FLAGYL) IVPB 500 mg        500 mg 100 mL/hr over 60 Minutes Intravenous Every 12 hours 12/23/22 0937 12/28/22 0959   12/23/22 0100  azithromycin (ZITHROMAX) 500 mg in dextrose 5 % 250 mL IVPB  Status:  Discontinued        500 mg 250 mL/hr over 60 Minutes Intravenous Every 24 hours 12/23/22 0019 12/23/22 0936   12/22/22 1915  cefTRIAXone (ROCEPHIN) 2 g in sodium chloride 0.9 % 100 mL IVPB        2 g 200 mL/hr over 30 Minutes Intravenous  Once 12/22/22 1915 12/22/22 2008        MEDICATIONS: Scheduled Meds:  amLODipine  10 mg Oral Daily   enoxaparin (LOVENOX) injection  30 mg Subcutaneous Q24H   feeding supplement  237 mL Oral BID BM   fluticasone furoate-vilanterol  1 puff  Inhalation Daily   insulin aspart  0-6 Units Subcutaneous TID WC   levothyroxine  75 mcg Oral Q0600   pantoprazole  40 mg Oral Q1200   topiramate  100 mg Oral BID   Continuous Infusions:  cefTRIAXone (ROCEPHIN)  IV 2 g (12/24/22 1722)   metronidazole 500 mg (12/25/22 0942)   PRN Meds:.albuterol, oxyCODONE-acetaminophen   I have personally reviewed following labs and imaging studies  LABORATORY DATA: CBC: Recent Labs  Lab 12/22/22 1541 12/22/22 1602 12/23/22 0504 12/24/22 0435  WBC 8.4  --  14.2* 10.7*  NEUTROABS 6.9  --   --   --   HGB 11.9* 13.6 10.7* 10.0*  HCT 39.0 40.0 33.2* 31.0*  MCV 90.3  --  87.6 88.6  PLT 309  --  271 272    Basic Metabolic Panel: Recent Labs  Lab 12/22/22 1541 12/22/22 1602 12/23/22 0504 12/24/22 0435 12/25/22 0334  NA 135 136 132* 141 140  K 3.1* 3.2* 3.3* 3.9 3.5  CL 96*  --  98 107 106  CO2 26  --  24 25 25   GLUCOSE 185*  --  155* 128*  189*  BUN 25*  --  22 17 22   CREATININE 1.73*  --  1.62* 1.46* 1.45*  CALCIUM 9.5  --  9.0 8.9 9.2    GFR: Estimated Creatinine Clearance: 30.2 mL/min (A) (by C-G formula based on SCr of 1.45 mg/dL (H)).  Liver Function Tests: Recent Labs  Lab 12/22/22 1541  AST 25  ALT 17  ALKPHOS 53  BILITOT 0.7  PROT 6.5  ALBUMIN 3.4*   Recent Labs  Lab 12/22/22 1541  LIPASE 33   Recent Labs  Lab 12/22/22 1525  AMMONIA <10    Coagulation Profile: No results for input(s): "INR", "PROTIME" in the last 168 hours.  Cardiac Enzymes: No results for input(s): "CKTOTAL", "CKMB", "CKMBINDEX", "TROPONINI" in the last 168 hours.  BNP (last 3 results) No results for input(s): "PROBNP" in the last 8760 hours.  Lipid Profile: No results for input(s): "CHOL", "HDL", "LDLCALC", "TRIG", "CHOLHDL", "LDLDIRECT" in the last 72 hours.  Thyroid Function Tests: Recent Labs    12/22/22 1626  TSH 2.577  FREET4 0.90  T3FREE 2.0    Anemia Panel: Recent Labs    12/25/22 0334  VITAMINB12 725  FOLATE  15.0    Urine analysis:    Component Value Date/Time   COLORURINE YELLOW 12/22/2022 1554   APPEARANCEUR HAZY (A) 12/22/2022 1554   LABSPEC 1.025 12/22/2022 1554   PHURINE 5.0 12/22/2022 1554   GLUCOSEU 50 (A) 12/22/2022 1554   HGBUR SMALL (A) 12/22/2022 1554   BILIRUBINUR NEGATIVE 12/22/2022 1554   BILIRUBINUR negative 03/08/2018 1405   KETONESUR NEGATIVE 12/22/2022 1554   PROTEINUR >=300 (A) 12/22/2022 1554   UROBILINOGEN 0.2 03/08/2018 1405   UROBILINOGEN 0.2 12/02/2009 1134   NITRITE NEGATIVE 12/22/2022 1554   LEUKOCYTESUR NEGATIVE 12/22/2022 1554    Sepsis Labs: Lactic Acid, Venous    Component Value Date/Time   LATICACIDVEN 3.1 (HH) 12/22/2022 1907    MICROBIOLOGY: Recent Results (from the past 240 hour(s))  Blood culture (routine x 2)     Status: None (Preliminary result)   Collection Time: 12/22/22  3:25 PM   Specimen: BLOOD  Result Value Ref Range Status   Specimen Description BLOOD RIGHT ANTECUBITAL  Final   Special Requests   Final    BOTTLES DRAWN AEROBIC AND ANAEROBIC Blood Culture adequate volume   Culture  Setup Time   Final    GRAM POSITIVE RODS AEROBIC BOTTLE ONLY CRITICAL RESULT CALLED TO, READ BACK BY AND VERIFIED WITH: PHARMD JESSICA CARNEY ON 12/24/22 @ 1917 BY DRT    Culture   Final    GRAM POSITIVE RODS CULTURE REINCUBATED FOR BETTER GROWTH Performed at [facility], [address], [place], [place] [address]    Report Status PENDING  Incomplete  Blood culture (routine x 2)     Status: None (Preliminary result)   Collection Time: 12/23/22 12:41 AM   Specimen: BLOOD LEFT ARM  Result Value Ref Range Status   Specimen Description BLOOD LEFT ARM  Final   Special Requests   Final    BOTTLES DRAWN AEROBIC AND ANAEROBIC Blood Culture adequate volume   Culture   Final    NO GROWTH 2 DAYS Performed at [facility], [address] [place], [place] [address]    Report Status PENDING  Incomplete    RADIOLOGY STUDIES/RESULTS: No  results found.   LOS: 2 days   Jeoffrey Massed, MD  [facility]    To contact the attending provider between 7A-7P or the covering provider  during after hours 7P-7A, please log into the web site [email or web address] and access using universal [place] password for that web site. If you do not have the password, please call the hospital operator.  12/25/2022, 11:06 AM

## 2022-12-26 ENCOUNTER — Inpatient Hospital Stay (HOSPITAL_COMMUNITY): Payer: 59

## 2022-12-26 DIAGNOSIS — E11641 Type 2 diabetes mellitus with hypoglycemia with coma: Secondary | ICD-10-CM | POA: Diagnosis not present

## 2022-12-26 DIAGNOSIS — G9340 Encephalopathy, unspecified: Secondary | ICD-10-CM | POA: Diagnosis not present

## 2022-12-26 DIAGNOSIS — E162 Hypoglycemia, unspecified: Secondary | ICD-10-CM | POA: Diagnosis not present

## 2022-12-26 DIAGNOSIS — A419 Sepsis, unspecified organism: Secondary | ICD-10-CM | POA: Diagnosis not present

## 2022-12-26 LAB — GLUCOSE, CAPILLARY
Glucose-Capillary: 194 mg/dL — ABNORMAL HIGH (ref 70–99)
Glucose-Capillary: 199 mg/dL — ABNORMAL HIGH (ref 70–99)
Glucose-Capillary: 152 mg/dL — ABNORMAL HIGH (ref 70–99)
Glucose-Capillary: 274 mg/dL — ABNORMAL HIGH (ref 70–99)
Glucose-Capillary: 261 mg/dL — ABNORMAL HIGH (ref 70–99)

## 2022-12-26 MED ORDER — LORAZEPAM 2 MG/ML IJ SOLN
1.0000 mg | Freq: Once | INTRAMUSCULAR | Status: DC | PRN
  Filled 2022-12-26: qty 1

## 2022-12-26 NOTE — Progress Notes (Signed)
PROGRESS NOTE        PATIENT DETAILS Name: Brianna Mitchell Age: 67 y.o. Sex: female Date of Birth: 10/21/1955 Admit Date: 12/22/2022 Admitting Physician Hillary Bow, DO ZOX:WRUE, Zannie Cove, MD  Brief Summary: Patient is a 67 y.o.  female with history of DM-2, HTN, hypothyroidism, chronic pain syndrome on narcotics-who presented to the hospital on 11/6 with hypoglycemia (CBG 34 per EMS)/altered mental status-she was also noted to have vomitus in her mouth-with infiltrates on chest x-ray-thought to have aspiration pneumonia.  She was subsequently admitted to the hospitalist service.  Significant events: 11/6>> admit to [facility]  Significant studies: 11/6>> CXR: Right perihilar opacity-aspiration pneumonitis versus bronchopneumonia 11/7>> x-ray abdomen: Nonobstructive pattern 11/7>> MRI brain: No acute abnormality but Motion degraded exam 11/7>> Spot EEG: No seizures.  Significant microbiology data: 11/7>> blood culture: Gram-positive rods (likely a contamination)  Procedures: None  Consults: Neurology  Subjective: No new issues overnight-appears unchanged-same speech pattern-intermittently confused.  Objective: Vitals: Blood pressure (!) 148/83, pulse 84, temperature 99.5 F (37.5 C), temperature source Axillary, resp. rate 19, height 4\' 10"  (1.473 m), weight 67.6 kg, SpO2 97%.   Exam: Gen Exam:Alert awake-not in any distress HEENT:atraumatic, normocephalic Chest: B/L clear to auscultation anteriorly CVS:S1S2 regular Abdomen:soft non tender, non distended Extremities:no edema Neurology: Non focal Skin: no rash  Pertinent Labs/Radiology:    Latest Ref Rng & Units 12/24/2022    4:35 AM 12/23/2022    5:04 AM 12/22/2022    4:02 PM  CBC  WBC 4.0 - 10.5 K/uL 10.7  14.2    Hemoglobin 12.0 - 15.0 g/dL 45.4  09.8  11.9   Hematocrit 36.0 - 46.0 % 31.0  33.2  40.0   Platelets 150 - 400 K/uL 272  271      Lab Results  Component Value Date   NA 140  12/25/2022   K 3.5 12/25/2022   CL 106 12/25/2022   CO2 25 12/25/2022      Assessment/Plan: Profound hypoglycemia Symptomatic-altered on initial presentation Managed with supportive care-D10 infusion-CBGs now stable-no longer on D10 infusion Unclear why she became profoundly hypoglycemic-she is on insulin at home-unsure if she missed a meal. CBGs now stable-allow permissive hyperglycemia  Severe sepsis secondary to aspiration pneumonia Sepsis physiology resolved Cultures negative Appreciate SLP eval-seems to be tolerating diet well Continue Rocephin/Flagyl for now  Acute metabolic encephalopathy Likely primarily due to hypoglycemia-possible some contributions from sepsis due to aspiration pneumonia.  Unfortunately-speech pattern abnormality/perseverance-and intermittent confusion continues-appreciate neurology input-current suspicion that she may have had anoxic injury due to hyperglycemia-plan is to repeat MRI in the next day or so if her mental status does not improve further SLP/PT/OT following Maintain delirium precautions.    AKI on CKD stage 3b AKI likely hemodynamically mediated in the setting of hypoglycemia/sepsis physiology Improving with supportive care-close to usual baseline  HTN BP improved-continue amlodipine  Continue to hold Lisinopril  Hypothyroidism TSH 11/6 stable Continue levothyroxine  DM-2 (A1c 8.3 on 11/8) See above-rehypoglycemia Not a candidate for aggressive glycemic control-allow for some amount of permissive hyperglycemia Continue SSI  Recent Labs    12/25/22 2044 12/26/22 0424 12/26/22 0758  GLUCAP 209* 194* 199*     Minimal elevated troponin No anginal symptoms Insignificant clinically No further workup required  Bronchial asthma Stable-not in exacerbation Bronchodilators  GERD PPI  Migraine headaches On Aimovig as an outpatient  Topamax on hold until encephalopathy improves further As needed Tylenol for breakthrough  headaches  Chronic pain syndrome-on chronic prescription opiate Encephalopathy seems to be slowly improving Opiates held on admission but at risk for withdrawal symptoms Continue Percocet for now-and continue to hold long-acting narcotics.  Obesity: Estimated body mass index is 31.15 kg/m as calculated from the following:   Height as of this encounter: 4\' 10"  (1.473 m).   Weight as of this encounter: 67.6 kg.   Code status:   Code Status: Full Code   DVT Prophylaxis: enoxaparin (LOVENOX) injection 40 mg Start: 12/26/22 1000   Family Communication: Daughter-[phone] on 11/10-left VM   Disposition Plan: Status is: Inpatient Remains inpatient appropriate because: Severity of illness   Planned Discharge Destination:Home health   Diet: Diet Order             Diet Carb Modified Fluid consistency: Thin; Room service appropriate? Yes with Assist  Diet effective now                     Antimicrobial agents: Anti-infectives (From admission, onward)    Start     Dose/Rate Route Frequency Ordered Stop   12/23/22 1800  cefTRIAXone (ROCEPHIN) 2 g in sodium chloride 0.9 % 100 mL IVPB        2 g 200 mL/hr over 30 Minutes Intravenous Every 24 hours 12/23/22 0019 12/28/22 1759   12/23/22 1000  metroNIDAZOLE (FLAGYL) IVPB 500 mg        500 mg 100 mL/hr over 60 Minutes Intravenous Every 12 hours 12/23/22 0937 12/28/22 0959   12/23/22 0100  azithromycin (ZITHROMAX) 500 mg in dextrose 5 % 250 mL IVPB  Status:  Discontinued        500 mg 250 mL/hr over 60 Minutes Intravenous Every 24 hours 12/23/22 0019 12/23/22 0936   12/22/22 1915  cefTRIAXone (ROCEPHIN) 2 g in sodium chloride 0.9 % 100 mL IVPB        2 g 200 mL/hr over 30 Minutes Intravenous  Once 12/22/22 1915 12/22/22 2008        MEDICATIONS: Scheduled Meds:  amLODipine  10 mg Oral Daily   enoxaparin (LOVENOX) injection  40 mg Subcutaneous Q24H   feeding supplement  237 mL Oral BID BM   fluticasone  furoate-vilanterol  1 puff Inhalation Daily   insulin aspart  0-6 Units Subcutaneous TID WC   levothyroxine  75 mcg Oral Q0600   pantoprazole  40 mg Oral Q1200   topiramate  100 mg Oral BID   Continuous Infusions:  cefTRIAXone (ROCEPHIN)  IV Stopped (12/25/22 1838)   metronidazole 500 mg (12/26/22 0855)   PRN Meds:.albuterol, oxyCODONE-acetaminophen   I have personally reviewed following labs and imaging studies  LABORATORY DATA: CBC: Recent Labs  Lab 12/22/22 1541 12/22/22 1602 12/23/22 0504 12/24/22 0435  WBC 8.4  --  14.2* 10.7*  NEUTROABS 6.9  --   --   --   HGB 11.9* 13.6 10.7* 10.0*  HCT 39.0 40.0 33.2* 31.0*  MCV 90.3  --  87.6 88.6  PLT 309  --  271 272    Basic Metabolic Panel: Recent Labs  Lab 12/22/22 1541 12/22/22 1602 12/23/22 0504 12/24/22 0435 12/25/22 0334  NA 135 136 132* 141 140  K 3.1* 3.2* 3.3* 3.9 3.5  CL 96*  --  98 107 106  CO2 26  --  24 25 25   GLUCOSE 185*  --  155* 128* 189*  BUN 25*  --  22 17 22   CREATININE 1.73*  --  1.62* 1.46* 1.45*  CALCIUM 9.5  --  9.0 8.9 9.2    GFR: Estimated Creatinine Clearance: 30.2 mL/min (A) (by C-G formula based on SCr of 1.45 mg/dL (H)).  Liver Function Tests: Recent Labs  Lab 12/22/22 1541  AST 25  ALT 17  ALKPHOS 53  BILITOT 0.7  PROT 6.5  ALBUMIN 3.4*   Recent Labs  Lab 12/22/22 1541  LIPASE 33   Recent Labs  Lab 12/22/22 1525  AMMONIA <10    Coagulation Profile: No results for input(s): "INR", "PROTIME" in the last 168 hours.  Cardiac Enzymes: No results for input(s): "CKTOTAL", "CKMB", "CKMBINDEX", "TROPONINI" in the last 168 hours.  BNP (last 3 results) No results for input(s): "PROBNP" in the last 8760 hours.  Lipid Profile: No results for input(s): "CHOL", "HDL", "LDLCALC", "TRIG", "CHOLHDL", "LDLDIRECT" in the last 72 hours.  Thyroid Function Tests: No results for input(s): "TSH", "T4TOTAL", "FREET4", "T3FREE", "THYROIDAB" in the last 72 hours.   Anemia  Panel: Recent Labs    12/25/22 0334  VITAMINB12 725  FOLATE 15.0    Urine analysis:    Component Value Date/Time   COLORURINE YELLOW 12/22/2022 1554   APPEARANCEUR HAZY (A) 12/22/2022 1554   LABSPEC 1.025 12/22/2022 1554   PHURINE 5.0 12/22/2022 1554   GLUCOSEU 50 (A) 12/22/2022 1554   HGBUR SMALL (A) 12/22/2022 1554   BILIRUBINUR NEGATIVE 12/22/2022 1554   BILIRUBINUR negative 03/08/2018 1405   KETONESUR NEGATIVE 12/22/2022 1554   PROTEINUR >=300 (A) 12/22/2022 1554   UROBILINOGEN 0.2 03/08/2018 1405   UROBILINOGEN 0.2 12/02/2009 1134   NITRITE NEGATIVE 12/22/2022 1554   LEUKOCYTESUR NEGATIVE 12/22/2022 1554    Sepsis Labs: Lactic Acid, Venous    Component Value Date/Time   LATICACIDVEN 3.1 (HH) 12/22/2022 1907    MICROBIOLOGY: Recent Results (from the past 240 hour(s))  Blood culture (routine x 2)     Status: None (Preliminary result)   Collection Time: 12/22/22  3:25 PM   Specimen: BLOOD  Result Value Ref Range Status   Specimen Description BLOOD RIGHT ANTECUBITAL  Final   Special Requests   Final    BOTTLES DRAWN AEROBIC AND ANAEROBIC Blood Culture adequate volume   Culture  Setup Time   Final    GRAM POSITIVE RODS AEROBIC BOTTLE ONLY CRITICAL RESULT CALLED TO, READ BACK BY AND VERIFIED WITH: PHARMD JESSICA CARNEY ON 12/24/22 @ 1917 BY DRT    Culture   Final    GRAM POSITIVE RODS CULTURE REINCUBATED FOR BETTER GROWTH Performed at [facility], [address] [place], [place] [address]    Report Status PENDING  Incomplete  Blood culture (routine x 2)     Status: None (Preliminary result)   Collection Time: 12/23/22 12:41 AM   Specimen: BLOOD LEFT ARM  Result Value Ref Range Status   Specimen Description BLOOD LEFT ARM  Final   Special Requests   Final    BOTTLES DRAWN AEROBIC AND ANAEROBIC Blood Culture adequate volume   Culture   Final    NO GROWTH 3 DAYS Performed at [facility], [address], [place], [place] [address]    Report  Status PENDING  Incomplete    RADIOLOGY STUDIES/RESULTS: No results found.   LOS: 3 days   Jeoffrey Massed, MD  [facility]    To contact the attending provider between 7A-7P or the covering provider during after hours 7P-7A, please log into the web site  [email or web address] and access using universal [place] password for that web site. If you do not have the password, please call the hospital operator.  12/26/2022, 10:39 AM

## 2022-12-27 DIAGNOSIS — G9340 Encephalopathy, unspecified: Secondary | ICD-10-CM | POA: Diagnosis not present

## 2022-12-27 DIAGNOSIS — A419 Sepsis, unspecified organism: Secondary | ICD-10-CM | POA: Diagnosis not present

## 2022-12-27 DIAGNOSIS — R4182 Altered mental status, unspecified: Secondary | ICD-10-CM | POA: Diagnosis not present

## 2022-12-27 DIAGNOSIS — R6520 Severe sepsis without septic shock: Secondary | ICD-10-CM | POA: Diagnosis not present

## 2022-12-27 DIAGNOSIS — E162 Hypoglycemia, unspecified: Secondary | ICD-10-CM | POA: Diagnosis not present

## 2022-12-27 DIAGNOSIS — E11641 Type 2 diabetes mellitus with hypoglycemia with coma: Secondary | ICD-10-CM | POA: Diagnosis not present

## 2022-12-27 LAB — GLUCOSE, CAPILLARY
Glucose-Capillary: 154 mg/dL — ABNORMAL HIGH (ref 70–99)
Glucose-Capillary: 169 mg/dL — ABNORMAL HIGH (ref 70–99)
Glucose-Capillary: 187 mg/dL — ABNORMAL HIGH (ref 70–99)
Glucose-Capillary: 208 mg/dL — ABNORMAL HIGH (ref 70–99)
Glucose-Capillary: 236 mg/dL — ABNORMAL HIGH (ref 70–99)
Glucose-Capillary: 254 mg/dL — ABNORMAL HIGH (ref 70–99)

## 2022-12-27 MED ORDER — INSULIN ASPART 100 UNIT/ML IJ SOLN
0.0000 [IU] | Freq: Three times a day (TID) | INTRAMUSCULAR | Status: DC
  Administered 2022-12-27: 3 [IU] via SUBCUTANEOUS
  Administered 2022-12-27 – 2022-12-28 (×3): 2 [IU] via SUBCUTANEOUS
  Administered 2022-12-28: 1 [IU] via SUBCUTANEOUS
  Administered 2022-12-28: 5 [IU] via SUBCUTANEOUS
  Administered 2022-12-29: 2 [IU] via SUBCUTANEOUS
  Administered 2022-12-29: 3 [IU] via SUBCUTANEOUS

## 2022-12-27 NOTE — Plan of Care (Signed)

## 2022-12-27 NOTE — Progress Notes (Signed)
Physical Therapy Treatment Patient Details Name: Brianna Mitchell MRN: [id number] DOB: [birth year] Today's Date: 12/27/2022   History of Present Illness 67 yo female presenting with AMS found to have severe sepsis secondary to aspiration pneumonia. PMH asthma, carpal tunnel, DM II, GERD, HTN, thyroid disease, cholecystectomy.    PT Comments  Pt progressing slowly towards her physical therapy goals. Overall follows one step commands, however, cognition remains affected and is mobilizing slowly. Pt ambulating 30 ft with contact guard assist and no assistive device. Needs prompting to participate and initiate ADL tasks. Recommend increased supervision upon return home.    If plan is discharge home, recommend the following: A little help with walking and/or transfers;A little help with bathing/dressing/bathroom;Assistance with cooking/housework;Direct supervision/assist for medications management;Direct supervision/assist for financial management;Assist for transportation;Help with stairs or ramp for entrance;Supervision due to cognitive status   Can travel by private vehicle        Equipment Recommendations  None recommended by PT    Recommendations for Other Services       Precautions / Restrictions Precautions Precautions: Fall Restrictions Weight Bearing Restrictions: No     Mobility  Bed Mobility Overal bed mobility: Needs Assistance Bed Mobility: Supine to Sit     Supine to sit: Min assist     General bed mobility comments: Pt reaching for HHA to pull up to sit    Transfers Overall transfer level: Needs assistance Equipment used: None Transfers: Sit to/from Stand Sit to Stand: Contact guard assist                Ambulation/Gait Ambulation/Gait assistance: Contact guard assist Gait Distance (Feet): 30 Feet Assistive device: None Gait Pattern/deviations: Decreased stride length, Step-through pattern, Drifts right/left Gait velocity: decreased Gait velocity  interpretation: <1.31 ft/sec, indicative of household ambulator   General Gait Details: Very slow speed, intermittently reaching for external support, mild unsteadiness   Stairs             Wheelchair Mobility     Tilt Bed    Modified Rankin (Stroke Patients Only)       Balance Overall balance assessment: Needs assistance   Sitting balance-Leahy Scale: Fair     Standing balance support: No upper extremity supported Standing balance-Leahy Scale: Fair                              Cognition Arousal: Alert Behavior During Therapy: WFL for tasks assessed/performed Overall Cognitive Status: No family/caregiver present to determine baseline cognitive functioning                                          Exercises      General Comments        Pertinent Vitals/Pain Pain Assessment Pain Assessment: PAINAD Faces Pain Scale: No hurt    Home Living                          Prior Function            PT Goals (current goals can now be found in the care plan section) Acute Rehab PT Goals Potential to Achieve Goals: Good Progress towards PT goals: Progressing toward goals    Frequency    Min 1X/week      PT Plan      Co-evaluation  AM-PAC PT "6 Clicks" Mobility   Outcome Measure  Help needed turning from your back to your side while in a flat bed without using bedrails?: None Help needed moving from lying on your back to sitting on the side of a flat bed without using bedrails?: A Little Help needed moving to and from a bed to a chair (including a wheelchair)?: A Little Help needed standing up from a chair using your arms (e.g., wheelchair or bedside chair)?: A Little Help needed to walk in hospital room?: A Little Help needed climbing 3-5 steps with a railing? : A Lot 6 Click Score: 18    End of Session Equipment Utilized During Treatment: Gait belt Activity Tolerance: Patient tolerated  treatment well Patient left: in chair;with call bell/phone within reach;with chair alarm set Nurse Communication: Mobility status PT Visit Diagnosis: Other abnormalities of gait and mobility (R26.89)     Time: 4696-2952 PT Time Calculation (min) (ACUTE ONLY): 22 min  Charges:    $Therapeutic Activity: 8-22 mins PT General Charges $$ ACUTE PT VISIT: 1 Visit                     Lillia Pauls, PT, DPT Acute Rehabilitation Services Office [phone]    Brianna Mitchell 12/27/2022, 4:51 PM

## 2022-12-27 NOTE — Inpatient Diabetes Management (Addendum)
Inpatient Diabetes Program Recommendations  AACE/ADA: New Consensus Statement on Inpatient Glycemic Control (2015)  Target Ranges:  Prepandial:   less than 140 mg/dL      Peak postprandial:   less than 180 mg/dL (1-2 hours)      Critically ill patients:  140 - 180 mg/dL   Lab Results  Component Value Date   GLUCAP 154 (H) 12/27/2022   HGBA1C 8.3 (H) 12/24/2022    Review of Glycemic Control  Latest Reference Range & Units 12/26/22 20:36 12/27/22 00:40 12/27/22 03:12 12/27/22 08:12 12/27/22 12:02  Glucose-Capillary 70 - 99 mg/dL 409 (H) 811 (H) 914 (H) 208 (H) 154 (H)  (H): Data is abnormally high  Diabetes history: DM2 Outpatient Diabetes medications: Humalog 5-18 TID, Humulin 5 units BID Current orders for Inpatient glycemic control: Novolog 0-9 units TID  Inpatient Diabetes Program Recommendations:    Please consider:  Semglee 5 units every day (50% of home dose)  Attempted to speak with her regarding severe hypoglycemia at home but she is too confused.  We will follow up when appropriate.    Will continue to follow while inpatient.  Thank you, Dulce Sellar, MSN, CDCES Diabetes Coordinator Inpatient Diabetes Program [phone] (team pager from 8a-5p)

## 2022-12-27 NOTE — Progress Notes (Signed)
Subjective: Lying in bed, watching [employer] Price is Right  Objective: Current vital signs: BP 139/78   Pulse 96   Temp 99.1 F (37.3 C)   Resp 17   Ht 4\' 10"  (1.473 m)   Wt 67.6 kg   SpO2 99%   BMI 31.15 kg/m  Vital signs in last 24 hours: Temp:  [97.9 F (36.6 C)-99.1 F (37.3 C)] 99.1 F (37.3 C) (11/11 0809) Pulse Rate:  [81-110] 96 (11/11 0809) Resp:  [15-20] 17 (11/11 0506) BP: (112-152)/(66-89) 139/78 (11/11 0809) SpO2:  [99 %-100 %] 99 % (11/11 0809)  Intake/Output from previous day: No intake/output data recorded. Intake/Output this shift: No intake/output data recorded. Nutritional status:  Diet Order             Diet Carb Modified Fluid consistency: Thin; Room service appropriate? Yes with Assist  Diet effective now                  HEENT: [place]/AT Lungs: Respirations unlabored Ext: No edema  Neurologic Exam: Mental Status: Able to state name, but otherwise not oriented to situation, city, state, day, month or year. Also unable to state that she is in the hospital. Can correctly name a thumb, pinky and index finger. Bradyphrenia is a prominent feature on exam, but speech is fluent in the context of sparse verbal output. Can follow simple commands, but becomes confused with more complex commands such as for cerebellar testing. Perseverates occasionally and also has poor attention. No dysarthria.   Cranial Nerves:  II: PERRL.  III, IV, VI: EOMI. Eyelids elevate symmetrically. EOMI with saccadic visual pursuits noted.  VII: No facial droop  VIII: Hearing intact to voice. IX, X: Phonation is normal.  XI: Symmetric XII: tongue is midline  Motor: 5/5 strength to all muscle groups tested, but is reluctant to elevate LUE at shoulder to command, requiring repeated requests to do so. Unable to state if she has pain or old injury to her left shoulder.   Sensation: Intact to light touch bilaterally.  Coordination: FTN intact bilaterally, but bradykinetic  Gait:  Deferred  Lab Results: Results for orders placed or performed during the hospital encounter of 12/22/22 (from the past 48 hour(s))  Glucose, capillary     Status: Abnormal   Collection Time: 12/25/22  4:39 PM  Result Value Ref Range   Glucose-Capillary 243 (H) 70 - 99 mg/dL    Comment: Glucose reference range applies only to samples taken after fasting for at least 8 hours.  Glucose, capillary     Status: Abnormal   Collection Time: 12/25/22  8:44 PM  Result Value Ref Range   Glucose-Capillary 209 (H) 70 - 99 mg/dL    Comment: Glucose reference range applies only to samples taken after fasting for at least 8 hours.  Glucose, capillary     Status: Abnormal   Collection Time: 12/26/22  4:24 AM  Result Value Ref Range   Glucose-Capillary 194 (H) 70 - 99 mg/dL    Comment: Glucose reference range applies only to samples taken after fasting for at least 8 hours.  Glucose, capillary     Status: Abnormal   Collection Time: 12/26/22  7:58 AM  Result Value Ref Range   Glucose-Capillary 199 (H) 70 - 99 mg/dL    Comment: Glucose reference range applies only to samples taken after fasting for at least 8 hours.  Glucose, capillary     Status: Abnormal   Collection Time: 12/26/22 12:05 PM  Result Value Ref  Range   Glucose-Capillary 152 (H) 70 - 99 mg/dL    Comment: Glucose reference range applies only to samples taken after fasting for at least 8 hours.  Glucose, capillary     Status: Abnormal   Collection Time: 12/26/22  4:26 PM  Result Value Ref Range   Glucose-Capillary 274 (H) 70 - 99 mg/dL    Comment: Glucose reference range applies only to samples taken after fasting for at least 8 hours.  Glucose, capillary     Status: Abnormal   Collection Time: 12/26/22  8:36 PM  Result Value Ref Range   Glucose-Capillary 261 (H) 70 - 99 mg/dL    Comment: Glucose reference range applies only to samples taken after fasting for at least 8 hours.  Glucose, capillary     Status: Abnormal   Collection  Time: 12/27/22 12:40 AM  Result Value Ref Range   Glucose-Capillary 236 (H) 70 - 99 mg/dL    Comment: Glucose reference range applies only to samples taken after fasting for at least 8 hours.  Glucose, capillary     Status: Abnormal   Collection Time: 12/27/22  3:12 AM  Result Value Ref Range   Glucose-Capillary 254 (H) 70 - 99 mg/dL    Comment: Glucose reference range applies only to samples taken after fasting for at least 8 hours.  Glucose, capillary     Status: Abnormal   Collection Time: 12/27/22  8:12 AM  Result Value Ref Range   Glucose-Capillary 208 (H) 70 - 99 mg/dL    Comment: Glucose reference range applies only to samples taken after fasting for at least 8 hours.    Recent Results (from the past 240 hour(s))  Blood culture (routine x 2)     Status: None (Preliminary result)   Collection Time: 12/22/22  3:25 PM   Specimen: BLOOD  Result Value Ref Range Status   Specimen Description BLOOD RIGHT ANTECUBITAL  Final   Special Requests   Final    BOTTLES DRAWN AEROBIC AND ANAEROBIC Blood Culture adequate volume   Culture  Setup Time   Final    GRAM POSITIVE RODS AEROBIC BOTTLE ONLY CRITICAL RESULT CALLED TO, READ BACK BY AND VERIFIED WITH: PHARMD JESSICA CARNEY ON 12/24/22 @ 1917 BY DRT    Culture   Final    GRAM POSITIVE RODS CULTURE REINCUBATED FOR BETTER GROWTH Performed at [facility], [address], [place], [place] [address]    Report Status PENDING  Incomplete  Blood culture (routine x 2)     Status: None (Preliminary result)   Collection Time: 12/23/22 12:41 AM   Specimen: BLOOD LEFT ARM  Result Value Ref Range Status   Specimen Description BLOOD LEFT ARM  Final   Special Requests   Final    BOTTLES DRAWN AEROBIC AND ANAEROBIC Blood Culture adequate volume   Culture   Final    NO GROWTH 4 DAYS Performed at [facility], [address], [place], [place] [address]    Report Status PENDING  Incomplete    Lipid Panel No results for input(s):  "CHOL", "TRIG", "HDL", "CHOLHDL", "VLDL", "LDLCALC" in the last 72 hours.  Studies/Results: MR BRAIN WO CONTRAST  Result Date: 12/27/2022 CLINICAL DATA:  Altered mental status, possibly secondary to hypoxic and/or hypoglycemic injury EXAM: MRI HEAD WITHOUT CONTRAST TECHNIQUE: Multiplanar, multiecho pulse sequences of the brain and surrounding structures were obtained without intravenous contrast. COMPARISON:  12/23/2022 MRI FINDINGS: Brain: No restricted diffusion to suggest acute or subacute infarct.  No acute hemorrhage, mass, mass effect, or midline shift. No hydrocephalus or extra-axial collection. Pituitary and craniocervical junction within normal limits. No hemosiderin deposition to suggest remote hemorrhage. 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 T2 hyperintense signal in the periventricular white matter and pons, likely the sequela of mild-to-moderate chronic small vessel ischemic disease. Vascular: Normal arterial flow voids. Skull and upper cervical spine: Normal marrow signal. Sinuses/Orbits: Clear paranasal sinuses. No acute finding in the orbits. Other: The mastoid air cells are well aerated. IMPRESSION: No acute intracranial process. No evidence of acute or subacute infarct. Electronically Signed   By: Wiliam Ke M.D.   On: 12/27/2022 01:39    Medications: Scheduled:  amLODipine  10 mg Oral Daily   enoxaparin (LOVENOX) injection  40 mg Subcutaneous Q24H   feeding supplement  237 mL Oral BID BM   fluticasone furoate-vilanterol  1 puff Inhalation Daily   insulin aspart  0-9 Units Subcutaneous TID WC   levothyroxine  75 mcg Oral Q0600   pantoprazole  40 mg Oral Q1200   topiramate  100 mg Oral BID   Continuous:  cefTRIAXone (ROCEPHIN)  IV 2 g (12/26/22 1801)   metronidazole 500 mg (12/27/22 0910)    Assessment:  67 y.o. female with a past medical history of Anemia, Asthma, Back pain, Carpal tunnel syndrome, Diabetes mellitus, Fibroid tumor, Ganglion cyst,  GERD (gastroesophageal reflux disease), Headache(784.0), Heart murmur, Hypertension, Hypothyroidism, Shortness of breath, Shoulder pain, Tendonitis, Tennis elbow, and Thyroid disease who presented originally with altered mental status in the setting of hypoglycemia and aspiration pneumonia.  Per family, patient is normally alert and oriented and able to handle her own affairs at baseline.  Prior to her hypoglycemic episode, she was reportedly at mental baseline.   - On today's exam she continues to be clearly cognitively impaired, with bradyphrenia, bradykinesia, easily confused by simple questions, oriented only to self and with sparse speech. She is able to follow simple commands and can name a thumb, pinky and forefinger. Some verbal perseveration is noted.  - Imaging: - CT head was negative for acute abnormality. 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 T2 hyperintense signal in the periventricular white matter and pons, likely the sequela of mild-to-moderate chronic small vessel ischemic disease. - EEG on 11/7 demonstrated no seizure activity.   - Labs: - Most recent labs from 11/8 with glucose of 189 and Cr of 1.45.  - Folate normal at 15 - B12 normal at 725 - Most recent WBC mildly elevated at 10.7 on 11/8 - TSH normal. T4 normal.  - HIV nonreactive - RPR nonreactive - Vitamin B1 level is pending - DDx: - Highest on the DDx is hypoglycemic brain injury, due to profound hypoglycemia on presentation. There is often a permanent decrement in neurological function after recovery from the acute effects of an episode of sustained severe hypoglycemia - Patient's daughter states that sometimes she skips meals, so nutritional labs have been ordered. Skipping a meal, or more than one meal in a row may have precipitated her hypoglycemia  - Delirium and toxic metabolic encephalopathy in the setting of aspiration pneumonia could explain patient's  presentation. However, given that patient had a blood glucose of 34 on EMS arrival and had to be bagged for about 10 minutes, the greatest concern is for hypoglycemic brain injury.   - Wernicke's encephalopathy is felt to be unlikely.  - Hypoxic/ischemic brain injury is felt to be unlikely given overall  pattern of her presentation along with exam findings.     Recommendations: - Glycemic control with a regimen that has a low risk for recurrent hypoglycemia - Maintain delirium precautions - Neurology service will sign off. Please call if there are additional questions.       LOS: 4 days   @Electronically  signed: Dr. Caryl Pina 12/27/2022  11:37 AM

## 2022-12-27 NOTE — Progress Notes (Addendum)
PROGRESS NOTE        PATIENT DETAILS Name: Brianna Mitchell Age: 67 y.o. Sex: female Date of Birth: 1956-01-12 Admit Date: 12/22/2022 Admitting Physician Hillary Bow, DO GNF:AOZH, Zannie Cove, MD  Brief Summary: Patient is a 67 y.o.  female with history of DM-2, HTN, hypothyroidism, chronic pain syndrome on narcotics-who presented to the hospital on 11/6 with hypoglycemia (CBG 34 per EMS)/altered mental status-she was also noted to have vomitus in her mouth-with infiltrates on chest x-ray-thought to have aspiration pneumonia.  She was subsequently admitted to the hospitalist service.  Significant events: 11/6>> admit to [facility]  Significant studies: 11/6>> CXR: Right perihilar opacity-aspiration pneumonitis versus bronchopneumonia 11/7>> x-ray abdomen: Nonobstructive pattern 11/7>> MRI brain: No acute abnormality but Motion degraded exam 11/7>> Spot EEG: No seizures 11/9>> MRI brain: No acute abnormality.  Significant microbiology data: 11/7>> blood culture: Gram-positive rods (likely a contamination)  Procedures: None  Consults: Neurology  Subjective: Await-continues to have speech issues-somewhat slow to respond at times.  Objective: Vitals: Blood pressure 139/78, pulse 96, temperature 99.1 F (37.3 C), resp. rate 17, height 4\' 10"  (1.473 m), weight 67.6 kg, SpO2 99%.   Exam: Gen Exam:not in any distress HEENT:atraumatic, normocephalic Chest: B/L clear to auscultation anteriorly CVS:S1S2 regular Abdomen:soft non tender, non distended Extremities:no edema Neurology: Non focal Skin: no rash  Pertinent Labs/Radiology:    Latest Ref Rng & Units 12/24/2022    4:35 AM 12/23/2022    5:04 AM 12/22/2022    4:02 PM  CBC  WBC 4.0 - 10.5 K/uL 10.7  14.2    Hemoglobin 12.0 - 15.0 g/dL 08.6  57.8  46.9   Hematocrit 36.0 - 46.0 % 31.0  33.2  40.0   Platelets 150 - 400 K/uL 272  271      Lab Results  Component Value Date   NA 140 12/25/2022   K 3.5  12/25/2022   CL 106 12/25/2022   CO2 25 12/25/2022      Assessment/Plan: Profound hypoglycemia Symptomatic-altered on initial presentation Managed with supportive care-D10 infusion-CBGs now stable-no longer on D10 infusion Unclear why she became profoundly hypoglycemic-she is on insulin at home-unsure if she missed a meal. CBGs now stable-allow permissive hyperglycemia  Severe sepsis secondary to aspiration pneumonia Sepsis physiology resolved Cultures negative Appreciate SLP eval-seems to be tolerating diet well Rocephin/Flagyl x 5 days total.  Acute metabolic encephalopathy Primarily due to hypoglycemia-with some contribution from aspiration pneumonia Unfortunately no further improvement for the past several days Repeat MRI brain 11/10-no acute abnormalities Thought to have hypoglycemic induced Brain injury at this point-will need to discuss with family. SLP/PT/OT following Spoke with  patient's daughter on 11/11-she will get in touch with her brother-and then will discuss with transition of care team to see what other support she is going need on discharge.  Patient unfortunately lives alone and might need much more supervision than prior arrangement.  AKI on CKD stage 3b AKI likely hemodynamically mediated in the setting of hypoglycemia/sepsis physiology Improving with supportive care-close to usual baseline  HTN BP improved-continue amlodipine  Continue to hold Lisinopril  Hypothyroidism TSH 11/6 stable Continue levothyroxine  DM-2 (A1c 8.3 on 11/8) See above-rehypoglycemia Not a candidate for aggressive glycemic control-allow for some amount of permissive hyperglycemia Continue SSI  Recent Labs    12/27/22 0040 12/27/22 0312 12/27/22 0812  GLUCAP 236* 254* 208*  Minimal elevated troponin No anginal symptoms Insignificant clinically No further workup required  Bronchial asthma Stable-not in exacerbation Bronchodilators  GERD PPI  Migraine  headaches On Aimovig as an outpatient Topamax on hold until encephalopathy improves further As needed Tylenol for breakthrough headaches  Chronic pain syndrome-on chronic prescription opiate Encephalopathy seems to be slowly improving Opiates held on admission but at risk for withdrawal symptoms Continue Percocet for now-and continue to hold long-acting narcotics.  Obesity: Estimated body mass index is 31.15 kg/m as calculated from the following:   Height as of this encounter: 4\' 10"  (1.473 m).   Weight as of this encounter: 67.6 kg.   Code status:   Code Status: Full Code   DVT Prophylaxis: enoxaparin (LOVENOX) injection 40 mg Start: 12/26/22 1000   Family Communication: Daughter-[phone] on 11/11   Disposition Plan: Status is: Inpatient Remains inpatient appropriate because: Severity of illness   Planned Discharge Destination:Home health   Diet: Diet Order             Diet Carb Modified Fluid consistency: Thin; Room service appropriate? Yes with Assist  Diet effective now                     Antimicrobial agents: Anti-infectives (From admission, onward)    Start     Dose/Rate Route Frequency Ordered Stop   12/23/22 1800  cefTRIAXone (ROCEPHIN) 2 g in sodium chloride 0.9 % 100 mL IVPB        2 g 200 mL/hr over 30 Minutes Intravenous Every 24 hours 12/23/22 0019 12/28/22 1759   12/23/22 1000  metroNIDAZOLE (FLAGYL) IVPB 500 mg        500 mg 100 mL/hr over 60 Minutes Intravenous Every 12 hours 12/23/22 0937 12/28/22 0959   12/23/22 0100  azithromycin (ZITHROMAX) 500 mg in dextrose 5 % 250 mL IVPB  Status:  Discontinued        500 mg 250 mL/hr over 60 Minutes Intravenous Every 24 hours 12/23/22 0019 12/23/22 0936   12/22/22 1915  cefTRIAXone (ROCEPHIN) 2 g in sodium chloride 0.9 % 100 mL IVPB        2 g 200 mL/hr over 30 Minutes Intravenous  Once 12/22/22 1915 12/22/22 2008        MEDICATIONS: Scheduled Meds:  amLODipine  10 mg Oral Daily    enoxaparin (LOVENOX) injection  40 mg Subcutaneous Q24H   feeding supplement  237 mL Oral BID BM   fluticasone furoate-vilanterol  1 puff Inhalation Daily   insulin aspart  0-9 Units Subcutaneous TID WC   levothyroxine  75 mcg Oral Q0600   pantoprazole  40 mg Oral Q1200   topiramate  100 mg Oral BID   Continuous Infusions:  cefTRIAXone (ROCEPHIN)  IV 2 g (12/26/22 1801)   metronidazole 500 mg (12/27/22 0910)   PRN Meds:.albuterol, LORazepam, oxyCODONE-acetaminophen   I have personally reviewed following labs and imaging studies  LABORATORY DATA: CBC: Recent Labs  Lab 12/22/22 1541 12/22/22 1602 12/23/22 0504 12/24/22 0435  WBC 8.4  --  14.2* 10.7*  NEUTROABS 6.9  --   --   --   HGB 11.9* 13.6 10.7* 10.0*  HCT 39.0 40.0 33.2* 31.0*  MCV 90.3  --  87.6 88.6  PLT 309  --  271 272    Basic Metabolic Panel: Recent Labs  Lab 12/22/22 1541 12/22/22 1602 12/23/22 0504 12/24/22 0435 12/25/22 0334  NA 135 136 132* 141 140  K 3.1* 3.2* 3.3* 3.9 3.5  CL  96*  --  98 107 106  CO2 26  --  24 25 25   GLUCOSE 185*  --  155* 128* 189*  BUN 25*  --  22 17 22   CREATININE 1.73*  --  1.62* 1.46* 1.45*  CALCIUM 9.5  --  9.0 8.9 9.2    GFR: Estimated Creatinine Clearance: 30.2 mL/min (A) (by C-G formula based on SCr of 1.45 mg/dL (H)).  Liver Function Tests: Recent Labs  Lab 12/22/22 1541  AST 25  ALT 17  ALKPHOS 53  BILITOT 0.7  PROT 6.5  ALBUMIN 3.4*   Recent Labs  Lab 12/22/22 1541  LIPASE 33   Recent Labs  Lab 12/22/22 1525  AMMONIA <10    Coagulation Profile: No results for input(s): "INR", "PROTIME" in the last 168 hours.  Cardiac Enzymes: No results for input(s): "CKTOTAL", "CKMB", "CKMBINDEX", "TROPONINI" in the last 168 hours.  BNP (last 3 results) No results for input(s): "PROBNP" in the last 8760 hours.  Lipid Profile: No results for input(s): "CHOL", "HDL", "LDLCALC", "TRIG", "CHOLHDL", "LDLDIRECT" in the last 72 hours.  Thyroid Function  Tests: No results for input(s): "TSH", "T4TOTAL", "FREET4", "T3FREE", "THYROIDAB" in the last 72 hours.   Anemia Panel: Recent Labs    12/25/22 0334  VITAMINB12 725  FOLATE 15.0    Urine analysis:    Component Value Date/Time   COLORURINE YELLOW 12/22/2022 1554   APPEARANCEUR HAZY (A) 12/22/2022 1554   LABSPEC 1.025 12/22/2022 1554   PHURINE 5.0 12/22/2022 1554   GLUCOSEU 50 (A) 12/22/2022 1554   HGBUR SMALL (A) 12/22/2022 1554   BILIRUBINUR NEGATIVE 12/22/2022 1554   BILIRUBINUR negative 03/08/2018 1405   KETONESUR NEGATIVE 12/22/2022 1554   PROTEINUR >=300 (A) 12/22/2022 1554   UROBILINOGEN 0.2 03/08/2018 1405   UROBILINOGEN 0.2 12/02/2009 1134   NITRITE NEGATIVE 12/22/2022 1554   LEUKOCYTESUR NEGATIVE 12/22/2022 1554    Sepsis Labs: Lactic Acid, Venous    Component Value Date/Time   LATICACIDVEN 3.1 (HH) 12/22/2022 1907    MICROBIOLOGY: Recent Results (from the past 240 hour(s))  Blood culture (routine x 2)     Status: None (Preliminary result)   Collection Time: 12/22/22  3:25 PM   Specimen: BLOOD  Result Value Ref Range Status   Specimen Description BLOOD RIGHT ANTECUBITAL  Final   Special Requests   Final    BOTTLES DRAWN AEROBIC AND ANAEROBIC Blood Culture adequate volume   Culture  Setup Time   Final    GRAM POSITIVE RODS AEROBIC BOTTLE ONLY CRITICAL RESULT CALLED TO, READ BACK BY AND VERIFIED WITH: PHARMD JESSICA CARNEY ON 12/24/22 @ 1917 BY DRT    Culture   Final    GRAM POSITIVE RODS CULTURE REINCUBATED FOR BETTER GROWTH Performed at [facility], [address], [place], [place] [address]    Report Status PENDING  Incomplete  Blood culture (routine x 2)     Status: None (Preliminary result)   Collection Time: 12/23/22 12:41 AM   Specimen: BLOOD LEFT ARM  Result Value Ref Range Status   Specimen Description BLOOD LEFT ARM  Final   Special Requests   Final    BOTTLES DRAWN AEROBIC AND ANAEROBIC Blood Culture adequate volume   Culture    Final    NO GROWTH 4 DAYS Performed at [facility], [address] [place], [place] [address]    Report Status PENDING  Incomplete    RADIOLOGY STUDIES/RESULTS: MR BRAIN WO CONTRAST  Result Date: 12/27/2022 CLINICAL  DATA:  Altered mental status, possibly secondary to hypoxic and/or hypoglycemic injury EXAM: MRI HEAD WITHOUT CONTRAST TECHNIQUE: Multiplanar, multiecho pulse sequences of the brain and surrounding structures were obtained without intravenous contrast. COMPARISON:  12/23/2022 MRI FINDINGS: Brain: No restricted diffusion to suggest acute or subacute infarct. No acute hemorrhage, mass, mass effect, or midline shift. No hydrocephalus or extra-axial collection. Pituitary and craniocervical junction within normal limits. No hemosiderin deposition to suggest remote hemorrhage. 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 T2 hyperintense signal in the periventricular white matter and pons, likely the sequela of mild-to-moderate chronic small vessel ischemic disease. Vascular: Normal arterial flow voids. Skull and upper cervical spine: Normal marrow signal. Sinuses/Orbits: Clear paranasal sinuses. No acute finding in the orbits. Other: The mastoid air cells are well aerated. IMPRESSION: No acute intracranial process. No evidence of acute or subacute infarct. Electronically Signed   By: Wiliam Ke M.D.   On: 12/27/2022 01:39     LOS: 4 days   Jeoffrey Massed, MD  [facility]    To contact the attending provider between 7A-7P or the covering provider during after hours 7P-7A, please log into the web site [email or web address] and access using universal [facility] password for that web site. If you do not have the password, please call the hospital operator.  12/27/2022, 10:41 AM

## 2022-12-27 NOTE — Progress Notes (Signed)
Mobility Specialist Progress Note;   12/27/22 0910  Mobility  Activity Ambulated with assistance to bathroom  Level of Assistance Contact guard assist, steadying assist  Assistive Device Front wheel walker  Distance Ambulated (ft) 30 ft  Activity Response Tolerated well  Mobility Referral Yes  $Mobility charge 1 Mobility  Mobility Specialist Start Time (ACUTE ONLY) 0910  Mobility Specialist Stop Time (ACUTE ONLY) 0930  Mobility Specialist Time Calculation (min) (ACUTE ONLY) 20 min   Pt agreeable to mobility. Requested assistance to BR, void successful, MS performed pericare for pt. Required minG assistance during ambulation. Min verbal cues required for hand placement on RW and directions to ambulate in room. Asx throughout and no c/o. Pt left in bed with all needs met.   Brianna Mitchell Mobility Specialist Please contact via SecureChat or Rehab Office [phone]

## 2022-12-27 NOTE — Plan of Care (Signed)
  Problem: Nutrition: Goal: Adequate nutrition will be maintained Outcome: Progressing   Problem: Activity: Goal: Risk for activity intolerance will decrease Outcome: Progressing   Problem: Coping: Goal: Level of anxiety will decrease Outcome: Progressing   Problem: Elimination: Goal: Will not experience complications related to bowel motility Outcome: Progressing   Problem: Pain Management: Goal: General experience of comfort will improve Outcome: Progressing   Problem: Skin Integrity: Goal: Risk for impaired skin integrity will decrease Outcome: Progressing

## 2022-12-28 DIAGNOSIS — R4182 Altered mental status, unspecified: Secondary | ICD-10-CM | POA: Diagnosis not present

## 2022-12-28 DIAGNOSIS — R6520 Severe sepsis without septic shock: Secondary | ICD-10-CM | POA: Diagnosis not present

## 2022-12-28 DIAGNOSIS — A419 Sepsis, unspecified organism: Secondary | ICD-10-CM | POA: Diagnosis not present

## 2022-12-28 DIAGNOSIS — E162 Hypoglycemia, unspecified: Secondary | ICD-10-CM | POA: Diagnosis not present

## 2022-12-28 LAB — GLUCOSE, CAPILLARY
Glucose-Capillary: 124 mg/dL — ABNORMAL HIGH (ref 70–99)
Glucose-Capillary: 148 mg/dL — ABNORMAL HIGH (ref 70–99)
Glucose-Capillary: 156 mg/dL — ABNORMAL HIGH (ref 70–99)
Glucose-Capillary: 157 mg/dL — ABNORMAL HIGH (ref 70–99)
Glucose-Capillary: 167 mg/dL — ABNORMAL HIGH (ref 70–99)
Glucose-Capillary: 199 mg/dL — ABNORMAL HIGH (ref 70–99)
Glucose-Capillary: 315 mg/dL — ABNORMAL HIGH (ref 70–99)

## 2022-12-28 LAB — CULTURE, BLOOD (ROUTINE X 2)
Culture: NO GROWTH
Special Requests: ADEQUATE
Special Requests: ADEQUATE

## 2022-12-28 LAB — BASIC METABOLIC PANEL
Anion gap: 7 (ref 5–15)
BUN: 24 mg/dL — ABNORMAL HIGH (ref 8–23)
CO2: 22 mmol/L (ref 22–32)
Calcium: 9.2 mg/dL (ref 8.9–10.3)
Chloride: 109 mmol/L (ref 98–111)
Creatinine, Ser: 1.24 mg/dL — ABNORMAL HIGH (ref 0.44–1.00)
GFR, Estimated: 47 mL/min — ABNORMAL LOW (ref >60)
Glucose, Bld: 167 mg/dL — ABNORMAL HIGH (ref 70–99)
Potassium: 3.6 mmol/L (ref 3.5–5.1)
Sodium: 138 mmol/L (ref 135–145)

## 2022-12-28 MED ORDER — METFORMIN HCL 500 MG PO TABS
500.0000 mg | ORAL_TABLET | Freq: Every day | ORAL | Status: DC
  Administered 2022-12-28 – 2022-12-29 (×2): 500 mg via ORAL
  Filled 2022-12-28 (×2): qty 1

## 2022-12-28 MED ORDER — LINAGLIPTIN 5 MG PO TABS
5.0000 mg | ORAL_TABLET | Freq: Every day | ORAL | Status: DC
  Administered 2022-12-28 – 2022-12-29 (×2): 5 mg via ORAL
  Filled 2022-12-28 (×2): qty 1

## 2022-12-28 MED ORDER — SIMETHICONE 80 MG PO CHEW: 80.0000 mg | CHEWABLE_TABLET | Freq: Four times a day (QID) | ORAL | Status: DC | PRN

## 2022-12-28 MED ORDER — METRONIDAZOLE 500 MG/100ML IV SOLN
500.0000 mg | Freq: Two times a day (BID) | INTRAVENOUS | Status: DC
  Administered 2022-12-28 – 2022-12-29 (×3): 500 mg via INTRAVENOUS
  Filled 2022-12-28 (×3): qty 100

## 2022-12-28 MED ORDER — CALCIUM CARBONATE ANTACID 500 MG PO CHEW
1.0000 | CHEWABLE_TABLET | Freq: Four times a day (QID) | ORAL | Status: DC | PRN
  Administered 2022-12-28: 200 mg via ORAL
  Filled 2022-12-28: qty 1

## 2022-12-28 NOTE — Progress Notes (Addendum)
Mobility Specialist Progress Note;   12/28/22 1500  Mobility  Activity Ambulated with assistance to bathroom  Level of Assistance Contact guard assist, steadying assist  Assistive Device Front wheel walker  Distance Ambulated (ft) 15 ft  Activity Response Tolerated well  Mobility Referral Yes  $Mobility charge 1 Mobility  Mobility Specialist Start Time (ACUTE ONLY) 1500  Mobility Specialist Stop Time (ACUTE ONLY) 1520  Mobility Specialist Time Calculation (min) (ACUTE ONLY) 20 min   Pt requesting assistance to BR, agreeable to mobility. Required minG assistance during ambulation. Void successful, pericare performed by MS. Min verbal cues required throughout for hand placement on RW and directions. C/o having a headache this afternoon but RN stated its ongoing. Pt returned back to bed with all needs met.   Caesar Bookman Mobility Specialist Please contact via SecureChat or Rehab Office [phone]

## 2022-12-28 NOTE — Progress Notes (Signed)
Mobility Specialist Progress Note;   12/28/22 1155  Therapy Vitals  Resp 12  BP 119/85  Mobility  Activity Ambulated with assistance in room  Level of Assistance Contact guard assist, steadying assist  Assistive Device Front wheel walker  Distance Ambulated (ft) 15 ft  Activity Response Tolerated well  Mobility Referral Yes  $Mobility charge 1 Mobility  Mobility Specialist Start Time (ACUTE ONLY) 1155  Mobility Specialist Stop Time (ACUTE ONLY) 1215  Mobility Specialist Time Calculation (min) (ACUTE ONLY) 20 min   Pt agreeable to mobility. C/o dizziness upon standing, although NT took BP at beginning of session, took 1x seated rest break for safety. Pt requested assistance with putting her wig on and ambulating to sink mirror to check it. No c/o dizziness with second STS. Required minG assistance during ambulation. Pt back in bed with all needs met. Requesting to ambulate again later today.   Caesar Bookman Mobility Specialist Please contact via SecureChat or Rehab Office [phone]

## 2022-12-28 NOTE — Progress Notes (Signed)
PROGRESS NOTE        PATIENT DETAILS Name: Brianna Mitchell Age: 67 y.o. Sex: female Date of Birth: Mar 09, 1955 Admit Date: 12/22/2022 Admitting Physician Hillary Bow, DO JXB:JYNW, Zannie Cove, MD  Brief Summary: Patient is a 67 y.o.  female with history of DM-2, HTN, hypothyroidism, chronic pain syndrome on narcotics-who presented to the hospital on 11/6 with hypoglycemia (CBG 34 per EMS)/altered mental status-she was also noted to have vomitus in her mouth-with infiltrates on chest x-ray-thought to have aspiration pneumonia.  She was subsequently admitted to the hospitalist service.  Significant events: 11/6>> admit to [facility]  Significant studies: 11/6>> CXR: Right perihilar opacity-aspiration pneumonitis versus bronchopneumonia 11/7>> x-ray abdomen: Nonobstructive pattern 11/7>> MRI brain: No acute abnormality but Motion degraded exam 11/7>> Spot EEG: No seizures 11/9>> MRI brain: No acute abnormality.  Significant microbiology data: 11/7>> blood culture: Gram-positive rods   Procedures: None  Consults: Neurology  Subjective: Unchanged-same speech pattern-follows some commands-answers some questions appropriately.  Objective: Vitals: Blood pressure 138/80, pulse 93, temperature 98 F (36.7 C), temperature source Oral, resp. rate 19, height 4\' 10"  (1.473 m), weight 67.6 kg, SpO2 100%.   Exam: Gen Exam:Alert awake-not in any distress HEENT:atraumatic, normocephalic Chest: B/L clear to auscultation anteriorly CVS:S1S2 regular Abdomen:soft non tender, non distended Extremities:no edema Neurology: Non focal Skin: no rash  Pertinent Labs/Radiology:    Latest Ref Rng & Units 12/24/2022    4:35 AM 12/23/2022    5:04 AM 12/22/2022    4:02 PM  CBC  WBC 4.0 - 10.5 K/uL 10.7  14.2    Hemoglobin 12.0 - 15.0 g/dL 29.5  62.1  30.8   Hematocrit 36.0 - 46.0 % 31.0  33.2  40.0   Platelets 150 - 400 K/uL 272  271      Lab Results  Component Value Date    NA 138 12/28/2022   K 3.6 12/28/2022   CL 109 12/28/2022   CO2 22 12/28/2022      Assessment/Plan: Profound hypoglycemia Symptomatic-altered on initial presentation Managed with supportive care-D10 infusion-CBGs now stable-no longer on D10 infusion Unclear why she became profoundly hypoglycemic-she is on insulin at home-unsure if she missed a meal. CBGs now stable-allow permissive hyperglycemia  Severe sepsis secondary to aspiration pneumonia Sepsis physiology resolved Cultures negative Appreciate SLP eval-seems to be tolerating diet well Completed empiric Rocephin/Flagyl  Fannyhessea vaginae bacteremia Discussed with ID pharmacist-continue Flagyl x 2 additional days to complete a 7-day course Repeat blood cultures.  Acute metabolic encephalopathy Now thought to have hypoglycemic induced brain injury with no improvement over the past several days. Supportive care Avoid strict glycemic control Ongoing discussions with family to see if they can provide 24/7 care.  She will need SNF.  (Previously living by herself)  AKI on CKD stage 3b AKI likely hemodynamically mediated in the setting of hypoglycemia/sepsis physiology Improving with supportive care-close to usual baseline  HTN BP improved-continue amlodipine  Continue to hold Lisinopril  Hypothyroidism TSH 11/6 stable Continue levothyroxine  DM-2 (A1c 8.3 on 11/8) Not a candidate for aggressive glycemic control-allow for some amount of permissive hyperglycemia Continue SSI Will start Tradjenta/metformin and minimize use of insulin as much as possible.  Recent Labs    12/28/22 0004 12/28/22 0404 12/28/22 0808  GLUCAP 167* 157* 156*     Minimal elevated troponin No anginal symptoms Insignificant clinically No further workup  required  Bronchial asthma Stable-not in exacerbation Bronchodilators  GERD PPI  Migraine headaches On Aimovig as an outpatient Topamax on hold until encephalopathy improves  further As needed Tylenol for breakthrough headaches  Chronic pain syndrome-on chronic prescription opiate Encephalopathy seems to be slowly improving Opiates held on admission but at risk for withdrawal symptoms Continue Percocet for now-and continue to hold long-acting narcotics.  Obesity: Estimated body mass index is 31.15 kg/m as calculated from the following:   Height as of this encounter: 4\' 10"  (1.473 m).   Weight as of this encounter: 67.6 kg.   Code status:   Code Status: Full Code   DVT Prophylaxis: enoxaparin (LOVENOX) injection 40 mg Start: 12/26/22 1000   Family Communication: Daughter-Natasha-[phone] on 11/12   Disposition Plan: Status is: Inpatient Remains inpatient appropriate because: Severity of illness   Planned Discharge Destination:Home health   Diet: Diet Order             Diet Carb Modified Fluid consistency: Thin; Room service appropriate? Yes with Assist  Diet effective now                     Antimicrobial agents: Anti-infectives (From admission, onward)    Start     Dose/Rate Route Frequency Ordered Stop   12/28/22 0945  metroNIDAZOLE (FLAGYL) IVPB 500 mg        500 mg 100 mL/hr over 60 Minutes Intravenous Every 12 hours 12/28/22 0850 12/30/22 0944   12/23/22 1800  cefTRIAXone (ROCEPHIN) 2 g in sodium chloride 0.9 % 100 mL IVPB        2 g 200 mL/hr over 30 Minutes Intravenous Every 24 hours 12/23/22 0019 12/27/22 1705   12/23/22 1000  metroNIDAZOLE (FLAGYL) IVPB 500 mg        500 mg 100 mL/hr over 60 Minutes Intravenous Every 12 hours 12/23/22 0937 12/27/22 2218   12/23/22 0100  azithromycin (ZITHROMAX) 500 mg in dextrose 5 % 250 mL IVPB  Status:  Discontinued        500 mg 250 mL/hr over 60 Minutes Intravenous Every 24 hours 12/23/22 0019 12/23/22 0936   12/22/22 1915  cefTRIAXone (ROCEPHIN) 2 g in sodium chloride 0.9 % 100 mL IVPB        2 g 200 mL/hr over 30 Minutes Intravenous  Once 12/22/22 1915 12/22/22 2008         MEDICATIONS: Scheduled Meds:  amLODipine  10 mg Oral Daily   enoxaparin (LOVENOX) injection  40 mg Subcutaneous Q24H   feeding supplement  237 mL Oral BID BM   fluticasone furoate-vilanterol  1 puff Inhalation Daily   insulin aspart  0-9 Units Subcutaneous TID WC   levothyroxine  75 mcg Oral Q0600   linagliptin  5 mg Oral Daily   pantoprazole  40 mg Oral Q1200   topiramate  100 mg Oral BID   Continuous Infusions:  metronidazole     PRN Meds:.albuterol, LORazepam, oxyCODONE-acetaminophen   I have personally reviewed following labs and imaging studies  LABORATORY DATA: CBC: Recent Labs  Lab 12/22/22 1541 12/22/22 1602 12/23/22 0504 12/24/22 0435  WBC 8.4  --  14.2* 10.7*  NEUTROABS 6.9  --   --   --   HGB 11.9* 13.6 10.7* 10.0*  HCT 39.0 40.0 33.2* 31.0*  MCV 90.3  --  87.6 88.6  PLT 309  --  271 272    Basic Metabolic Panel: Recent Labs  Lab 12/22/22 1541 12/22/22 1602 12/23/22 0504 12/24/22 0435 12/25/22  0334 12/28/22 0410  NA 135 136 132* 141 140 138  K 3.1* 3.2* 3.3* 3.9 3.5 3.6  CL 96*  --  98 107 106 109  CO2 26  --  24 25 25 22   GLUCOSE 185*  --  155* 128* 189* 167*  BUN 25*  --  22 17 22  24*  CREATININE 1.73*  --  1.62* 1.46* 1.45* 1.24*  CALCIUM 9.5  --  9.0 8.9 9.2 9.2    GFR: Estimated Creatinine Clearance: 35.4 mL/min (A) (by C-G formula based on SCr of 1.24 mg/dL (H)).  Liver Function Tests: Recent Labs  Lab 12/22/22 1541  AST 25  ALT 17  ALKPHOS 53  BILITOT 0.7  PROT 6.5  ALBUMIN 3.4*   Recent Labs  Lab 12/22/22 1541  LIPASE 33   Recent Labs  Lab 12/22/22 1525  AMMONIA <10    Coagulation Profile: No results for input(s): "INR", "PROTIME" in the last 168 hours.  Cardiac Enzymes: No results for input(s): "CKTOTAL", "CKMB", "CKMBINDEX", "TROPONINI" in the last 168 hours.  BNP (last 3 results) No results for input(s): "PROBNP" in the last 8760 hours.  Lipid Profile: No results for input(s): "CHOL", "HDL",  "LDLCALC", "TRIG", "CHOLHDL", "LDLDIRECT" in the last 72 hours.  Thyroid Function Tests: No results for input(s): "TSH", "T4TOTAL", "FREET4", "T3FREE", "THYROIDAB" in the last 72 hours.   Anemia Panel: No results for input(s): "VITAMINB12", "FOLATE", "FERRITIN", "TIBC", "IRON", "RETICCTPCT" in the last 72 hours.   Urine analysis:    Component Value Date/Time   COLORURINE YELLOW 12/22/2022 1554   APPEARANCEUR HAZY (A) 12/22/2022 1554   LABSPEC 1.025 12/22/2022 1554   PHURINE 5.0 12/22/2022 1554   GLUCOSEU 50 (A) 12/22/2022 1554   HGBUR SMALL (A) 12/22/2022 1554   BILIRUBINUR NEGATIVE 12/22/2022 1554   BILIRUBINUR negative 03/08/2018 1405   KETONESUR NEGATIVE 12/22/2022 1554   PROTEINUR >=300 (A) 12/22/2022 1554   UROBILINOGEN 0.2 03/08/2018 1405   UROBILINOGEN 0.2 12/02/2009 1134   NITRITE NEGATIVE 12/22/2022 1554   LEUKOCYTESUR NEGATIVE 12/22/2022 1554    Sepsis Labs: Lactic Acid, Venous    Component Value Date/Time   LATICACIDVEN 3.1 (HH) 12/22/2022 1907    MICROBIOLOGY: Recent Results (from the past 240 hour(s))  Blood culture (routine x 2)     Status: None   Collection Time: 12/22/22  3:25 PM   Specimen: BLOOD  Result Value Ref Range Status   Specimen Description BLOOD RIGHT ANTECUBITAL  Final   Special Requests   Final    BOTTLES DRAWN AEROBIC AND ANAEROBIC Blood Culture adequate volume   Culture  Setup Time   Final    GRAM POSITIVE RODS AEROBIC BOTTLE ONLY CRITICAL RESULT CALLED TO, READ BACK BY AND VERIFIED WITH: PHARMD JESSICA CARNEY ON 12/24/22 @ 1917 BY DRT    Culture   Final    GRAM POSITIVE RODS UNABLE TO IDENTIFY GPR, PHARMD NOTIFIED PLEASE CALL MICRO LAB AT [phone] FOR FURTHER INFORMATION Performed at [facility], [address], [place], [place] [address]    Report Status 12/28/2022 FINAL  Final  Blood culture (routine x 2)     Status: None   Collection Time: 12/23/22 12:41 AM   Specimen: BLOOD LEFT ARM  Result Value Ref Range Status    Specimen Description BLOOD LEFT ARM  Final   Special Requests   Final    BOTTLES DRAWN AEROBIC AND ANAEROBIC Blood Culture adequate volume   Culture   Final    NO GROWTH 5 DAYS  Performed at [facility], [address] [place], [place] [address]    Report Status 12/28/2022 FINAL  Final  Culture, blood (Routine X 2) w Reflex to ID Panel     Status: None (Preliminary result)   Collection Time: 12/28/22  9:14 AM   Specimen: BLOOD LEFT ARM  Result Value Ref Range Status   Specimen Description BLOOD LEFT ARM  Final   Special Requests   Final    BOTTLES DRAWN AEROBIC AND ANAEROBIC Blood Culture adequate volume Performed at [facility], [address], [place], [place] [address]    Culture PENDING  Incomplete   Report Status PENDING  Incomplete    RADIOLOGY STUDIES/RESULTS: MR BRAIN WO CONTRAST  Result Date: 12/27/2022 CLINICAL DATA:  Altered mental status, possibly secondary to hypoxic and/or hypoglycemic injury EXAM: MRI HEAD WITHOUT CONTRAST TECHNIQUE: Multiplanar, multiecho pulse sequences of the brain and surrounding structures were obtained without intravenous contrast. COMPARISON:  12/23/2022 MRI FINDINGS: Brain: No restricted diffusion to suggest acute or subacute infarct. No acute hemorrhage, mass, mass effect, or midline shift. No hydrocephalus or extra-axial collection. Pituitary and craniocervical junction within normal limits. No hemosiderin deposition to suggest remote hemorrhage. 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 T2 hyperintense signal in the periventricular white matter and pons, likely the sequela of mild-to-moderate chronic small vessel ischemic disease. Vascular: Normal arterial flow voids. Skull and upper cervical spine: Normal marrow signal. Sinuses/Orbits: Clear paranasal sinuses. No acute finding in the orbits. Other: The mastoid air cells are well aerated. IMPRESSION: No acute intracranial process. No evidence of acute or  subacute infarct. Electronically Signed   By: Wiliam Ke M.D.   On: 12/27/2022 01:39     LOS: 5 days   Jeoffrey Massed, MD  [facility]    To contact the attending provider between 7A-7P or the covering provider during after hours 7P-7A, please log into the web site [email or web address] and access using universal [place] password for that web site. If you do not have the password, please call the hospital operator.  12/28/2022, 10:07 AM

## 2022-12-28 NOTE — Progress Notes (Signed)
Speech Language Pathology Treatment: Cognitive-Linquistic  Patient Details Name: Brianna Mitchell MRN: [id number] DOB: Jul 13, 1955 Today's Date: 12/28/2022 Time: 8295-6213 SLP Time Calculation (min) (ACUTE ONLY): 13 min  Assessment / Plan / Recommendation Clinical Impression  Pt was seen for skilled ST targeting cognitive-linguistic impairments.  Pt was encountered awake/alert in bed and was agreeable to this session.  Pt participated in conversational speech tasks and structured expressive/receptive language tasks including yes/no questions, confrontational naming, and command following.  Pt was noted to exhibit difficulty with auditory comprehension in conversational speech and with yes/no questions and command following.  She was able to complete confrontational naming tasks without overt difficulty, but exhibited perseveration, anomia,  and dysfluencies in conversational speech with associated labial tremors.  Pt was oriented x2 to self and city.  She was not oriented to year or place despite choice of two.  Pt additionally exhibited difficulty completing functional numeric problem solving questions; however, receptive language deficit likely contributed to this.  Recommend continuation of skilled ST acutely and at time of discharge.  Additionally recommend frequent supervision and full assistance with IADLs secondary to cognitive-linguistic deficits at time of discharge.     HPI HPI: Brianna Mitchell is a 7 yof who presented to ED via EMS after being found with profound hypoglycemia. Dx severe sepsis secondary to aspiration pna, metabolic encephalopathy, AKI.  MRI negative for acute abnormalities.      SLP Plan  Continue with current plan of care      Recommendations for follow up therapy are one component of a multi-disciplinary discharge planning process, led by the attending physician.  Recommendations may be updated based on patient status, additional functional criteria and insurance  authorization.    Recommendations                     Oral care BID   Frequent or constant Supervision/Assistance Cognitive communication deficit (R41.841)     Continue with current plan of care    Brianna Mitchell, M.S., CCC-SLP Acute Rehabilitation Services Office: [phone]  Brianna Mitchell [facility]  12/28/2022, 11:56 AM

## 2022-12-28 NOTE — TOC Progression Note (Signed)
Transition of Care [facility]) - Progression Note    Patient Details  Name: Brianna Mitchell MRN: [id number] Date of Birth: 1955-06-08  Transition of [facility]) CM/SW Contact  Gordy Clement, RN Phone Number: 12/28/2022, 10:31 AM  Clinical Narrative:     RNCM called and left VM for Daughter to call to doscuss DC plan.  Patient is unable to care for herself and will need 24/7   Family was to discuss this to see if they can care for patient  [facility] will continue to follow            Expected Discharge Plan and Services                                               Social Determinants of Health (SDOH) Interventions SDOH Screenings   Food Insecurity: No Food Insecurity (12/22/2022)  Housing: Low Risk  (12/22/2022)  Transportation Needs: No Transportation Needs (12/22/2022)  Utilities: Not At Risk (12/22/2022)  Depression (PHQ2-9): Low Risk  (04/12/2019)  Financial Resource Strain: Low Risk  (04/12/2019)  Physical Activity: Inactive (04/12/2019)  Social Connections: Unknown (03/17/2022)   [facility]  Stress: No Stress Concern Present (04/12/2019)  Tobacco Use: Low Risk  (12/22/2022)    Readmission Risk Interventions     No data to display

## 2022-12-28 NOTE — NC FL2 (Signed)
[place] MEDICAID FL2 LEVEL OF CARE FORM     IDENTIFICATION  Patient Name: Brianna Mitchell Birthdate: 17-Feb-1955 Sex: female Admission Date (Current Location): 12/22/2022  [facility] and [place] Number:  Producer, television/film/video and Address:  [facility] [place], [address] [place], [place] [address]      Provider Number: [id number]  Attending Physician Name and Address:  Maretta Bees, MD  Relative Name and Phone Number:       Current Level of Care: Hospital Recommended Level of Care: Skilled Nursing Facility Prior Approval Number:    Date Approved/Denied:   PASRR Number: [id number]  Discharge Plan: SNF    Current Diagnoses: Patient Active Problem List   Diagnosis Date Noted   AKI (acute kidney injury) (HCC) 12/23/2022   Chronic prescription opiate use 12/23/2022   Acute encephalopathy 12/23/2022   Severe sepsis (HCC) 12/23/2022   Elevated troponin 12/23/2022   Sepsis due to pneumonia (HCC) 12/23/2022   Aspiration pneumonia (HCC) 12/22/2022   Essential hypertension 03/09/2018   Other insomnia 03/09/2018   follows with endocrinology 03/09/2018   Hypothyroidism 03/09/2018   DM2 (diabetes mellitus, type 2) (HCC) 12/21/2017   Abdominal pain 12/21/2017   Shoulder arthritis 02/03/2016    Orientation RESPIRATION BLADDER Height & Weight     Self, Time, Situation, Place  Normal Incontinent Weight: 149 lb 0.5 oz (67.6 kg) Height:  4\' 10"  (147.3 cm)  BEHAVIORAL SYMPTOMS/MOOD NEUROLOGICAL BOWEL NUTRITION STATUS      Continent Diet (See dc summary)  AMBULATORY STATUS COMMUNICATION OF NEEDS Skin   Limited Assist Verbally Normal                       Personal Care Assistance Level of Assistance  Bathing, Dressing, Feeding Bathing Assistance: Limited assistance Feeding assistance: Limited assistance Dressing Assistance: Limited assistance     Functional Limitations Info  Sight, Hearing, Speech Sight Info: Adequate Hearing Info:  Adequate Speech Info: Impaired (delayed responses)    SPECIAL CARE FACTORS FREQUENCY  PT (By licensed PT), OT (By licensed OT)     PT Frequency: 5x week OT Frequency: 5x week            Contractures      Additional Factors Info  Code Status, Allergies, Insulin Sliding Scale Code Status Info: Full Allergies Info: Aspirin,  Sulfa Antibiotics,  Demerol , Penicillins   Insulin Sliding Scale Info: See dc summary       Current Medications (12/28/2022):  This is the current hospital active medication list Current Facility-Administered Medications  Medication Dose Route Frequency Provider Last Rate Last Admin   albuterol (PROVENTIL) (2.5 MG/3ML) 0.083% nebulizer solution 2.5 mg  2.5 mg Nebulization Q6H PRN Ghimire, Werner Lean, MD       amLODipine (NORVASC) tablet 10 mg  10 mg Oral Daily Maretta Bees, MD   10 mg at 12/28/22 0835   enoxaparin (LOVENOX) injection 40 mg  40 mg Subcutaneous Q24H Maretta Bees, MD   40 mg at 12/28/22 0835   feeding supplement (ENSURE ENLIVE / ENSURE PLUS) liquid 237 mL  237 mL Oral BID BM Maretta Bees, MD   237 mL at 12/28/22 0836   fluticasone furoate-vilanterol (BREO ELLIPTA) 100-25 MCG/ACT 1 puff  1 puff Inhalation Daily Maretta Bees, MD   1 puff at 12/28/22 0747   insulin aspart (novoLOG) injection 0-9 Units  0-9 Units Subcutaneous TID WC Ghimire, Werner Lean, MD   2 Units at  12/28/22 0835   levothyroxine (SYNTHROID) tablet 75 mcg  75 mcg Oral Q0600 Maretta Bees, MD   75 mcg at 12/28/22 0559   linagliptin (TRADJENTA) tablet 5 mg  5 mg Oral Daily Maretta Bees, MD   5 mg at 12/28/22 1004   LORazepam (ATIVAN) injection 1 mg  1 mg Intravenous Once PRN Dolly Rias, MD       metFORMIN (GLUCOPHAGE) tablet 500 mg  500 mg Oral Q breakfast Ghimire, Werner Lean, MD       metroNIDAZOLE (FLAGYL) IVPB 500 mg  500 mg Intravenous Q12H Maretta Bees, MD 100 mL/hr at 12/28/22 1016 500 mg at 12/28/22 1016   oxyCODONE-acetaminophen  (PERCOCET/ROXICET) 5-325 MG per tablet 1 tablet  1 tablet Oral Q6H PRN Maretta Bees, MD   1 tablet at 12/28/22 0835   pantoprazole (PROTONIX) EC tablet 40 mg  40 mg Oral Q1200 Maretta Bees, MD   40 mg at 12/28/22 1031   topiramate (TOPAMAX) tablet 100 mg  100 mg Oral BID Standley Brooking, MD   100 mg at 12/28/22 1000     Discharge Medications: Please see discharge summary for a list of discharge medications.  Relevant Imaging Results:  Relevant Lab Results:   Additional Information SS# [id number]  Michaela Corner, LCSWA

## 2022-12-28 NOTE — TOC Progression Note (Signed)
Transition of Care [facility]) - Progression Note    Patient Details  Name: Brianna Mitchell MRN: [id number] Date of Birth: [birth year]  Transition of Care [facility]) CM/SW Contact  Gordy Clement, RN Phone Number: 12/28/2022, 1:51 PM  Clinical Narrative:     RNCM spoke with Daughter regarding discharge needs. Daughter states that there is no one to offer the 24 hour care her Mother needs  She is agreeable to SNF placement and would like to speak to SW. RNCM also suggested to Daughter that she reach out to [facility] and Medicaid to see if they can offer any supplemental home care for patient . TOC will continue to follow patient for any additional discharge needs       Expected Discharge Plan: Skilled Nursing Facility Barriers to Discharge: Insurance Authorization  Expected Discharge Plan and Services                                               Social Determinants of Health (SDOH) Interventions SDOH Screenings   Food Insecurity: No Food Insecurity (12/22/2022)  Housing: Low Risk  (12/22/2022)  Transportation Needs: No Transportation Needs (12/22/2022)  Utilities: Not At Risk (12/22/2022)  Depression (PHQ2-9): Low Risk  (04/12/2019)  Financial Resource Strain: Low Risk  (04/12/2019)  Physical Activity: Inactive (04/12/2019)  Social Connections: Unknown (03/17/2022)   Received from [facility]  Stress: No Stress Concern Present (04/12/2019)  Tobacco Use: Low Risk  (12/22/2022)    Readmission Risk Interventions     No data to display

## 2022-12-28 NOTE — Inpatient Diabetes Management (Signed)
Inpatient Diabetes Program Recommendations  AACE/ADA: New Consensus Statement on Inpatient Glycemic Control (2015)  Target Ranges:  Prepandial:   less than 140 mg/dL      Peak postprandial:   less than 180 mg/dL (1-2 hours)      Critically ill patients:  140 - 180 mg/dL   Lab Results  Component Value Date   GLUCAP 315 (H) 12/28/2022   HGBA1C 8.3 (H) 12/24/2022    Review of Glycemic Control  Diabetes history: DM2 Outpatient Diabetes medications: Humalog 5-18 TID, Humulin 5 units BID Current orders for Inpatient glycemic control: Novolog 0-9 units TID, Trajenta 5 mg every day, Metformin 500 mg daily  Inpatient Diabetes Program Recommendations:   Spoke with patient to review hypoglycemia with patient. Patient states she has recently lost weight and has had hypoglycemia @ home. Reviewed with patient  hypoglycemia protocol and patient able to verbalize with concentrated. Discussed insulin doses will most be changed prior to discharge to prevent hypoglycemia @ home.  Thank you, Brianna Mitchell. Jess Toney, RN, MSN, CDCES  Diabetes Coordinator Inpatient Glycemic Control Team Team Pager [phone] (8am-5pm) 12/28/2022 2:08 PM

## 2022-12-28 NOTE — Care Management Important Message (Signed)
Important Message  Patient Details  Name: Brianna Mitchell MRN: [id number] Date of Birth: [birth year]   Important Message Given:  Yes - Medicare IM     Dorena Bodo 12/28/2022, 3:20 PM

## 2022-12-28 NOTE — Plan of Care (Signed)

## 2022-12-28 NOTE — Progress Notes (Signed)
Occupational Therapy Treatment Patient Details Name: Brianna Mitchell MRN: [id number] DOB: [birth year] Today's Date: 12/28/2022   History of present illness 67 yo female presenting with AMS found to have severe sepsis secondary to aspiration pneumonia. PMH asthma, carpal tunnel, DM II, GERD, HTN, thyroid disease, cholecystectomy.   OT comments  Pt presented in chair at this time. Pt completed scored 25 on short bless test (which indicates impairment consistent with dementia). Pt noted they became upset and started to cry in session as may start to be realizing challenges with ability to recall information but also possible difficulties with expressive when asking Brianna Mitchell. Patient when given simple task of brushing teeth in sitting she was able to complete with cue to complete. Acute Occupational Therapy will continue to follow at this time.         If plan is discharge home, recommend the following:  Supervision due to cognitive status;Direct supervision/assist for medications management;Direct supervision/assist for financial management;Assistance with cooking/housework   Equipment Recommendations  None recommended by OT    Recommendations for Other Services Speech consult    Precautions / Restrictions Precautions Precautions: Fall Restrictions Weight Bearing Restrictions: No       Mobility Bed Mobility               General bed mobility comments: Pt presented in bed    Transfers Overall transfer level: Needs assistance Equipment used: Rolling walker (2 wheels) Transfers: Sit to/from Stand Sit to Stand: Contact guard assist                 Balance Overall balance assessment: Needs assistance Sitting-balance support: Feet supported Sitting balance-Leahy Scale: Fair     Standing balance support: No upper extremity supported, Bilateral upper extremity supported Standing balance-Leahy Scale: Fair                             ADL either performed or  assessed with clinical judgement   ADL Overall ADL's : Needs assistance/impaired Eating/Feeding: Set up;Sitting Eating/Feeding Details (indicate cue type and reason): due to long nails Grooming: Set up;Sitting   Upper Body Bathing: Set up;Sitting   Lower Body Bathing: Minimal assistance;Sit to/from stand   Upper Body Dressing : Set up;Sitting   Lower Body Dressing: Minimal assistance;Sit to/from stand   Toilet Transfer: Contact guard assist;BSC/3in1   Toileting- Architect and Hygiene: Moderate assistance;Cueing for safety;Cueing for sequencing;Sit to/from stand       Functional mobility during ADLs: Rolling walker (2 wheels);Contact guard assist      Extremity/Trunk Assessment Upper Extremity Assessment Upper Extremity Assessment: Overall WFL for tasks assessed (Pt has very long nail and limits some FM skills)   Lower Extremity Assessment Lower Extremity Assessment: Defer to PT evaluation        Vision   Vision Assessment?: No apparent visual deficits   Perception     Praxis      Cognition Arousal: Alert Behavior During Therapy: WFL for tasks assessed/performed Overall Cognitive Status: No family/caregiver present to determine baseline cognitive functioning                                 General Comments: Pt scored 25 on short bless test which indicates impairment consistent with dementia.        Exercises      Shoulder Instructions       General Comments  Pertinent Vitals/ Pain       Pain Assessment Pain Assessment: 0-10 Pain Score: 0-No pain Pain Intervention(s): Monitored during session  Home Living                                          Prior Functioning/Environment              Frequency  Min 1X/week        Progress Toward Goals  OT Goals(current goals can now be found in the care plan section)  Progress towards OT goals: Progressing toward goals  Acute Rehab OT  Goals Patient Stated Goal: to get better OT Goal Formulation: With patient Time For Goal Achievement: 01/07/23 Potential to Achieve Goals: Good ADL Goals Pt Will Perform Grooming: with supervision;standing Pt Will Perform Lower Body Dressing: sitting/lateral leans;sit to/from stand;with set-up Pt Will Transfer to Toilet: with supervision;ambulating Additional ADL Goal #1: Pt will demonstrate improved attention span by moving on to next task 80% of the time once finished with ADL  Plan      Co-evaluation                 AM-PAC OT "6 Clicks" Daily Activity     Outcome Measure   Help from another person eating meals?: None Help from another person taking care of personal grooming?: A Little Help from another person toileting, which includes using toliet, bedpan, or urinal?: A Little Help from another person bathing (including washing, rinsing, drying)?: A Little Help from another person to put on and taking off regular upper body clothing?: A Little Help from another person to put on and taking off regular lower body clothing?: A Little 6 Click Score: 19    End of Session Equipment Utilized During Treatment: Gait belt;Rolling walker (2 wheels)  OT Visit Diagnosis: Unsteadiness on feet (R26.81);Other symptoms and signs involving cognitive function   Activity Tolerance Patient tolerated treatment well   Patient Left in chair;with call bell/phone within reach;with chair alarm set   Nurse Communication Mobility status        Time: 8295-6213 OT Time Calculation (min): 39 min  Charges: OT General Charges $OT Visit: 1 Visit OT Treatments $Self Care/Home Management : 38-52 mins  Presley Raddle OTR/L  Acute Rehab Services  [phone] office number   Alphia Moh 12/28/2022, 10:15 AM

## 2022-12-29 DIAGNOSIS — J690 Pneumonitis due to inhalation of food and vomit: Secondary | ICD-10-CM | POA: Diagnosis not present

## 2022-12-29 DIAGNOSIS — N179 Acute kidney failure, unspecified: Secondary | ICD-10-CM | POA: Diagnosis not present

## 2022-12-29 DIAGNOSIS — G9340 Encephalopathy, unspecified: Secondary | ICD-10-CM | POA: Diagnosis not present

## 2022-12-29 DIAGNOSIS — A419 Sepsis, unspecified organism: Secondary | ICD-10-CM | POA: Diagnosis not present

## 2022-12-29 LAB — GLUCOSE, CAPILLARY
Glucose-Capillary: 165 mg/dL — ABNORMAL HIGH (ref 70–99)
Glucose-Capillary: 176 mg/dL — ABNORMAL HIGH (ref 70–99)
Glucose-Capillary: 244 mg/dL — ABNORMAL HIGH (ref 70–99)

## 2022-12-29 MED ORDER — NOVOLOG FLEXPEN 100 UNIT/ML ~~LOC~~ SOPN: PEN_INJECTOR | SUBCUTANEOUS | Status: DC

## 2022-12-29 MED ORDER — METRONIDAZOLE 500 MG PO TABS: 500.0000 mg | ORAL_TABLET | Freq: Two times a day (BID) | ORAL | Status: AC

## 2022-12-29 MED ORDER — AMLODIPINE BESYLATE 10 MG PO TABS: 5.0000 mg | ORAL_TABLET | Freq: Every day | ORAL | Status: AC

## 2022-12-29 MED ORDER — LINAGLIPTIN 5 MG PO TABS: 5.0000 mg | ORAL_TABLET | Freq: Every day | ORAL | Status: AC

## 2022-12-29 MED ORDER — OXYCODONE-ACETAMINOPHEN 5-325 MG PO TABS: 1.0000 | ORAL_TABLET | Freq: Four times a day (QID) | ORAL | 0 refills | Status: AC | PRN

## 2022-12-29 MED ORDER — ENSURE ENLIVE PO LIQD: 237.0000 mL | Freq: Two times a day (BID) | ORAL | 12 refills | Status: DC

## 2022-12-29 MED ORDER — FLUTICASONE FUROATE-VILANTEROL 100-25 MCG/ACT IN AEPB: 1.0000 | INHALATION_SPRAY | Freq: Every day | RESPIRATORY_TRACT | Status: AC

## 2022-12-29 MED ORDER — METFORMIN HCL 500 MG PO TABS: 500.0000 mg | ORAL_TABLET | Freq: Every day | ORAL | Status: AC

## 2022-12-29 NOTE — TOC Transition Note (Addendum)
Transition of Care [facility]) - CM/SW Discharge Note   Patient Details  Name: Brianna Mitchell MRN: [id number] Date of Birth: [birth year]  Transition of Care [facility]) CM/SW Contact:  Mearl Latin, LCSW Phone Number: 12/29/2022, 12:00 PM   Clinical Narrative:    Patient will DC to: [facility] Anticipated DC date: 12/29/22 Family notified: Daughter, Environmental education officer by: Daughter by car at [employer]   Per MD patient ready for DC to [facility]. RN to call report prior to discharge [phone]). RN, patient, patient's family, and facility notified of DC. Discharge Summary and FL2 sent to facility. Signed scripts in DC packet on hard chart.   CSW will sign off for now as social work intervention is no longer needed. Please consult [place] again if new needs arise.     Final next level of care: Skilled Nursing Facility Barriers to Discharge: Barriers Resolved   Patient Goals and CMS Choice CMS Medicare.gov Compare Post Acute Care list provided to:: Other (Comment Required) (Daughter - Marcelle Smiling) Choice offered to / list presented to : Adult Children  Discharge Placement                Patient chooses bed at: [facility] Patient to be transferred to facility by: Marcelle Smiling Name of family member notified: Natasha Patient and family notified of of transfer: 12/29/22  Discharge Plan and Services Additional resources added to the After Visit Summary for                                       Social Determinants of Health (SDOH) Interventions SDOH Screenings   Food Insecurity: No Food Insecurity (12/22/2022)  Housing: Low Risk  (12/22/2022)  Transportation Needs: No Transportation Needs (12/22/2022)  Utilities: Not At Risk (12/22/2022)  Depression (PHQ2-9): Low Risk  (04/12/2019)  Financial Resource Strain: Low Risk  (04/12/2019)  Physical Activity: Inactive (04/12/2019)  Social Connections: Unknown (03/17/2022)   Received from [facility]  Stress: No Stress  Concern Present (04/12/2019)  Tobacco Use: Low Risk  (12/22/2022)     Readmission Risk Interventions     No data to display

## 2022-12-29 NOTE — Discharge Summary (Signed)
PATIENT DETAILS Name: Brianna Mitchell Age: 67 y.o. Sex: female Date of Birth: [birth year] MRN: [id number]. Admitting Physician: Hillary Bow, DO NWG:NFAO, Zannie Cove, MD  Admit Date: 12/22/2022 Discharge date: 12/29/2022  Recommendations for Outpatient Follow-up:  Follow up with PCP in 1-2 weeks Please obtain CMP/CBC in one week Allow permissive hyperglycemia-not a candidate for aggressive glycemic control.  Admitted From:  Home  Disposition: Skilled nursing facility   Discharge Condition: fair  CODE STATUS:   Code Status: Full Code   Diet recommendation:  Diet Order             Diet - low sodium heart healthy           Diet Carb Modified           Diet Carb Modified Fluid consistency: Thin; Room service appropriate? Yes with Assist  Diet effective now                    Brief Summary: Patient is a 67 y.o.  female with history of DM-2, HTN, hypothyroidism, chronic pain syndrome on narcotics-who presented to the hospital on 11/6 with hypoglycemia (CBG 34 per EMS)/altered mental status-she was also noted to have vomitus in her mouth-with infiltrates on chest x-ray-thought to have aspiration pneumonia.  She was subsequently admitted to the hospitalist service.   Significant events: 11/6>> admit to [facility]   Significant studies: 11/6>> CXR: Right perihilar opacity-aspiration pneumonitis versus bronchopneumonia 11/7>> x-ray abdomen: Nonobstructive pattern 11/7>> MRI brain: No acute abnormality but Motion degraded exam 11/7>> Spot EEG: No seizures 11/9>> MRI brain: No acute abnormality.   Significant microbiology data: 11/7>> blood culture: Gram-positive rods    Procedures: None   Consults: Neurology  Brief Hospital Course: Profound hypoglycemia Symptomatic-altered on initial presentation Managed with supportive care-D10 infusion-CBGs now stable-no longer on D10 infusion Unclear why she became profoundly hypoglycemic-she is on insulin at home-unsure if she  missed a meal.   Severe sepsis secondary to aspiration pneumonia Sepsis physiology resolved Cultures negative Appreciate SLP eval-seems to be tolerating diet well Completed empiric Rocephin/Flagyl   Fannyhessea vaginae bacteremia Discussed with ID pharmacist-continue Flagyl x 2 additional days to complete a 7-day course Repeat blood cultures on 11/12 negative so far.   Acute metabolic encephalopathy Now thought to have hypoglycemic induced brain injury with no improvement over the past several days.  She continues to have fluctuating levels of perseverance in her speech-and is somewhat still disoriented. Evaluated by neurology during this hospitalization-underwent workup-see above. Supportive care Avoid strict glycemic control   AKI on CKD stage 3b AKI likely hemodynamically mediated in the setting of hypoglycemia/sepsis physiology Improving with supportive care-close to usual baseline   HTN BP improved-continue amlodipine  Continue to hold Lisinopril   Hypothyroidism TSH 11/6 stable Continue levothyroxine   DM-2 (A1c 8.3 on 11/8) Not a candidate for aggressive glycemic control-allow for some amount of permissive hyperglycemia Suspect she is safest with oral medication that does not cause hypoglycemia-has been started on Tradjenta/metformin.  Since she is going to SNF-suspect can continue SSI for a week or so-if her sugars are stable on oral hypoglycemic agents then SSI could possibly be discontinued.  Please avoid tight glycemic control-see above.  Minimal elevated troponin No anginal symptoms Insignificant clinically No further workup required   Bronchial asthma Stable-not in exacerbation Bronchodilators   GERD PPI   Migraine headaches This appears to be stable-no major headaches during this hospitalization Since encephalopathy is improved-suspect Topamax could be resumed Follow-up with outpatient  neurology for further optimization of her medication Will be on as  needed Percocet as well.   Chronic pain syndrome-on chronic prescription opiate Encephalopathy seems to be slowly improving Continue Cymbalta Opiates held on admission but at risk for withdrawal symptoms-hence as needed Percocet resumed which she seems to be tolerating well.  Seems to be doing well-without long-acting narcotics.  Please reassess at SNF.   Obesity: Estimated body mass index is 31.15 kg/m as calculated from the following:   Height as of this encounter: 4\' 10"  (1.473 m).   Weight as of this encounter: 67.6 k   Discharge Diagnoses:  Principal Problem:   Severe sepsis (HCC) Active Problems:   Aspiration pneumonia (HCC)   AKI (acute kidney injury) (HCC)   Acute encephalopathy   DM2 (diabetes mellitus, type 2) (HCC)   Essential hypertension   Hypothyroidism   Chronic prescription opiate use   Elevated troponin   Sepsis due to pneumonia [facility])   Discharge Instructions:  Activity:  As tolerated with Full fall precautions use walker/cane & assistance as needed  Discharge Instructions     Diet - low sodium heart healthy   Complete by: As directed    Diet Carb Modified   Complete by: As directed    Discharge instructions   Complete by: As directed    Follow with Primary MD  Chow, Zannie Cove, MD in 1-2 weeks  Please get a complete blood count and chemistry panel checked by your Primary MD at your next visit, and again as instructed by your Primary MD.  Get Medicines reviewed and adjusted: Please take all your medications with you for your next visit with your Primary MD  Laboratory/radiological data: Please request your Primary MD to go over all hospital tests and procedure/radiological results at the follow up, please ask your Primary MD to get all Hospital records sent to his/her office.  In some cases, they will be blood work, cultures and biopsy results pending at the time of your discharge. Please request that your primary care M.D. follows up on these  results.  Also Note the following: If you experience worsening of your admission symptoms, develop shortness of breath, life threatening emergency, suicidal or homicidal thoughts you must seek medical attention immediately by calling 911 or calling your MD immediately  if symptoms less severe.  You must read complete instructions/literature along with all the possible adverse reactions/side effects for all the Medicines you take and that have been prescribed to you. Take any new Medicines after you have completely understood and accpet all the possible adverse reactions/side effects.   Do not drive when taking Pain medications or sleeping medications (Benzodaizepines)  Do not take more than prescribed Pain, Sleep and Anxiety Medications. It is not advisable to combine anxiety,sleep and pain medications without talking with your primary care practitioner  Special Instructions: If you have smoked or chewed Tobacco  in the last 2 yrs please stop smoking, stop any regular Alcohol  and or any Recreational drug use.  Wear Seat belts while driving.  Please note: You were cared for by a hospitalist during your hospital stay. Once you are discharged, your primary care physician will handle any further medical issues. Please note that NO REFILLS for any discharge medications will be authorized once you are discharged, as it is imperative that you return to your primary care physician (or establish a relationship with a primary care physician if you do not have one) for your post hospital discharge needs so that they  can reassess your need for medications and monitor your lab values.   Check CBGs before meals and at bedtime   Increase activity slowly   Complete by: As directed       Allergies as of 12/29/2022       Reactions   Aspirin Swelling   Sulfa Antibiotics Swelling   Demerol Rash   Penicillins Rash        Medication List     STOP taking these medications    Aimovig 140 MG/ML  Soaj Generic drug: Erenumab-aooe   beclomethasone 80 MCG/ACT inhaler Commonly known as: QVAR   dapagliflozin propanediol 10 MG Tabs tablet Commonly known as: FARXIGA   docusate sodium 100 MG capsule Commonly known as: COLACE   Farxiga 5 MG Tabs tablet Generic drug: dapagliflozin propanediol   fluticasone 44 MCG/ACT inhaler Commonly known as: FLOVENT HFA   HumaLOG KwikPen 100 UNIT/ML KwikPen Generic drug: insulin lispro   HumuLIN N KwikPen 100 UNIT/ML KwikPen Generic drug: Insulin NPH (Human) (Isophane)   hydrocortisone cream 0.5 %   Lactobacillus Tabs   lidocaine 5 % ointment Commonly known as: XYLOCAINE   lisinopril 10 MG tablet Commonly known as: ZESTRIL   loratadine 10 MG tablet Commonly known as: CLARITIN   metoCLOPramide 10 MG tablet Commonly known as: REGLAN   ondansetron 4 MG tablet Commonly known as: ZOFRAN   oxyCODONE-acetaminophen 10-325 MG tablet Commonly known as: PERCOCET Replaced by: oxyCODONE-acetaminophen 5-325 MG tablet   oxymorphone 30 MG 12 hr tablet Commonly known as: OPANA ER   Qulipta 60 MG Tabs Generic drug: Atogepant   rizatriptan 10 MG disintegrating tablet Commonly known as: MAXALT-MLT   tiZANidine 4 MG tablet Commonly known as: ZANAFLEX   Vigamox 0.5 % ophthalmic solution Generic drug: moxifloxacin       TAKE these medications    albuterol (2.5 MG/3ML) 0.083% nebulizer solution Commonly known as: PROVENTIL Take 3 mLs (2.5 mg total) by nebulization every 6 (six) hours as needed for wheezing or shortness of breath.   albuterol 108 (90 Base) MCG/ACT inhaler Commonly known as: VENTOLIN HFA Inhale 2 puffs into the lungs every 6 (six) hours as needed for wheezing or shortness of breath.   amLODipine 10 MG tablet Commonly known as: NORVASC Take 0.5 tablets (5 mg total) by mouth daily. What changed: medication strength   desloratadine 5 MG tablet Commonly known as: Clarinex Take 1 tablet (5 mg total) by mouth  daily.   dexlansoprazole 60 MG capsule Commonly known as: Dexilant Take 1 capsule (60 mg total) by mouth daily.   DULoxetine 60 MG capsule Commonly known as: CYMBALTA Take 60 mg by mouth every morning.   feeding supplement Liqd Take 237 mLs by mouth 2 (two) times daily between meals.   fluticasone furoate-vilanterol 100-25 MCG/ACT Aepb Commonly known as: BREO ELLIPTA Inhale 1 puff into the lungs daily. Start taking on: December 30, 2022   levothyroxine 75 MCG tablet Commonly known as: SYNTHROID Take 1 tablet (75 mcg total) by mouth daily before breakfast.   linagliptin 5 MG Tabs tablet Commonly known as: TRADJENTA Take 1 tablet (5 mg total) by mouth daily. Start taking on: December 30, 2022   metFORMIN 500 MG tablet Commonly known as: GLUCOPHAGE Take 1 tablet (500 mg total) by mouth daily with breakfast. Start taking on: December 30, 2022   metroNIDAZOLE 500 MG tablet Commonly known as: Flagyl Take 1 tablet (500 mg total) by mouth 2 (two) times daily for 1 day.   Narcan 4 MG/0.1ML Liqd  nasal spray kit Generic drug: naloxone Place 0.4 mg into the nose once.   NovoLOG FlexPen 100 UNIT/ML FlexPen Generic drug: insulin aspart 0-9 Units, Subcutaneous, 3 times daily with meals CBG < 70: Implement Hypoglycemia measures CBG 70 - 120: 0 units CBG 121 - 150: 1 unit CBG 151 - 200: 2 units CBG 201 - 250: 3 units CBG 251 - 300: 5 units CBG 301 - 350: 7 units CBG 351 - 400: 9 units CBG > 400: call MD   oxyCODONE-acetaminophen 5-325 MG tablet Commonly known as: PERCOCET/ROXICET Take 1 tablet by mouth every 6 (six) hours as needed for moderate pain (pain score 4-6). Replaces: oxyCODONE-acetaminophen 10-325 MG tablet   polyethylene glycol 17 g packet Commonly known as: MIRALAX / GLYCOLAX Take 17 g by mouth daily. What changed:  when to take this reasons to take this   rosuvastatin 40 MG tablet Commonly known as: CRESTOR TAKE ONE TABLET BY MOUTH EVERY DAY   topiramate 100 MG  tablet Commonly known as: TOPAMAX Take 1 tablet (100 mg total) by mouth 2 (two) times daily.        Contact information for follow-up providers     Chow, Zannie Cove, MD. Schedule an appointment as soon as possible for a visit in 1 week(s).   Specialty: Internal Medicine Contact information: [address] [place] [address] [phone]              Contact information for after-discharge care     Destination     [facility] Preferred SNF .   Service: Skilled Nursing Contact information: [address] [place] [address] [phone]                    Allergies  Allergen Reactions   Aspirin Swelling   Sulfa Antibiotics Swelling   Demerol Rash   Penicillins Rash     Other Procedures/Studies: MR BRAIN WO CONTRAST  Result Date: 12/27/2022 CLINICAL DATA:  Altered mental status, possibly secondary to hypoxic and/or hypoglycemic injury EXAM: MRI HEAD WITHOUT CONTRAST TECHNIQUE: Multiplanar, multiecho pulse sequences of the brain and surrounding structures were obtained without intravenous contrast. COMPARISON:  12/23/2022 MRI FINDINGS: Brain: No restricted diffusion to suggest acute or subacute infarct. No acute hemorrhage, mass, mass effect, or midline shift. No hydrocephalus or extra-axial collection. Pituitary and craniocervical junction within normal limits. No hemosiderin deposition to suggest remote hemorrhage. 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 T2 hyperintense signal in the periventricular white matter and pons, likely the sequela of mild-to-moderate chronic small vessel ischemic disease. Vascular: Normal arterial flow voids. Skull and upper cervical spine: Normal marrow signal. Sinuses/Orbits: Clear paranasal sinuses. No acute finding in the orbits. Other: The mastoid air cells are well aerated. IMPRESSION: No acute intracranial process. No evidence of acute or subacute infarct. Electronically  Signed   By: Wiliam Ke M.D.   On: 12/27/2022 01:39   EEG adult  Result Date: 12/23/2022 Charlsie Quest, MD     12/23/2022 10:34 AM Patient Name: KORTNI BODNAR MRN: [id number] Epilepsy Attending: Charlsie Quest Referring Physician/Provider: Hillary Bow, DO Date: 12/23/2022 Duration: 24.22 mins Patient history: 67yo F with ams getting eeg to evaluate for seizure Level of alertness: Awake AEDs during EEG study: TPM Technical aspects: This EEG study was done with scalp electrodes positioned according to the 10-20 International system of electrode placement. Electrical activity was reviewed with band pass filter of 1-70Hz , sensitivity of 7 uV/mm, display speed of  70mm/sec with a 60Hz  notched filter applied as appropriate. EEG data were recorded continuously and digitally stored.  Video monitoring was available and reviewed as appropriate. Description: The posterior dominant rhythm consists of 7 Hz activity of moderate voltage (25-35 uV) seen predominantly in posterior head regions, symmetric and reactive to eye opening and eye closing. EEG showed intermittent generalized 3 to 6 Hz theta-delta slowing. Hyperventilation and photic stimulation were not performed.   ABNORMALITY - Intermittent slow, generalized IMPRESSION: This study is suggestive of mild diffuse encephalopathy. No seizures or epileptiform discharges were seen throughout the recording. Charlsie Quest   MR BRAIN WO CONTRAST  Result Date: 12/23/2022 CLINICAL DATA:  Altered mental status EXAM: MRI HEAD WITHOUT CONTRAST TECHNIQUE: Multiplanar, multiecho pulse sequences of the brain and surrounding structures were obtained without intravenous contrast. COMPARISON:  None Available. FINDINGS: Brain: No acute infarct, mass effect or extra-axial collection. No chronic microhemorrhage or siderosis. There is multifocal hyperintense T2-weighted signal within the white matter. Parenchymal volume and CSF spaces are normal. Old left basal ganglia small  vessel infarct. Vascular: Normal flow voids. Skull and upper cervical spine: Normal marrow signal. Sinuses/Orbits: Negative. Other: Markedly motion degraded examination. IMPRESSION: 1. Markedly motion degraded examination. 2. No acute intracranial abnormality. 3. Old left basal ganglia small vessel infarct and findings of chronic small vessel ischemia. Electronically Signed   By: Deatra Robinson M.D.   On: 12/23/2022 03:54   DG Abd 1 View  Result Date: 12/23/2022 CLINICAL DATA:  Altered mental status, pneumonia, and encephalopathy. Vomiting, suspected aspiration. EXAM: ABDOMEN - 1 VIEW COMPARISON:  01/06/2013. FINDINGS: The bowel gas pattern is normal. Cholecystectomy clips are present in the right upper quadrant. Surgical clips are noted in the left lower quadrant. No radio-opaque calculi. The lung bases are clear. Lumbar spinal fusion hardware is noted at L3-L4. IMPRESSION: Nonobstructive bowel-gas pattern. Electronically Signed   By: Thornell Sartorius M.D.   On: 12/23/2022 02:09   CT HEAD WO CONTRAST ( )  Result Date: 12/22/2022 CLINICAL DATA:  Delirium EXAM: CT HEAD WITHOUT CONTRAST TECHNIQUE: Contiguous axial images were obtained from the base of the skull through the vertex without intravenous contrast. RADIATION DOSE REDUCTION: This exam was performed according to the departmental dose-optimization program which includes automated exposure control, adjustment of the mA and/or kV according to patient size and/or use of iterative reconstruction technique. COMPARISON:  11/21/2011 FINDINGS: Brain: No evidence of acute infarction, hemorrhage, mass, mass effect, or midline shift. No hydrocephalus or extra-axial fluid collection. Vascular: No hyperdense vessel. Skull: Negative for fracture or focal lesion. Sinuses/Orbits: No acute finding. Other: The mastoid air cells are well aerated. IMPRESSION: No acute intracranial process. Electronically Signed   By: Wiliam Ke M.D.   On: 12/22/2022 19:01   DG Chest  Portable 1 View  Result Date: 12/22/2022 CLINICAL DATA:  Altered mental status EXAM: PORTABLE CHEST 1 VIEW COMPARISON:  11/01/2012 FINDINGS: Atherosclerotic calcification of the aortic arch. The hazy right perihilar opacity and associated airway thickening, cannot exclude bronchopneumonia or aspiration pneumonitis. The left lung appears clear. Borderline enlargement of the cardiopericardial silhouette. No acute bony findings. IMPRESSION: 1. Hazy right perihilar opacity and associated airway thickening, cannot exclude bronchopneumonia or aspiration pneumonitis. 2. Borderline enlargement of the cardiopericardial silhouette. 3. Aortic Atherosclerosis (ICD10-I70.0). Electronically Signed   By: Gaylyn Rong M.D.   On: 12/22/2022 18:43     TODAY-DAY OF DISCHARGE:  Subjective:   Rosezetta Schlatter today has no headache,no chest abdominal pain,no new weakness tingling or numbness  Objective:  Blood pressure (!) 140/67, pulse 91, temperature 98.2 F (36.8 C), temperature source Oral, resp. rate 13, height 4\' 10"  (1.473 m), weight 67.6 kg, SpO2 100%.  Intake/Output Summary (Last 24 hours) at 12/29/2022 0910 Last data filed at 12/29/2022 0400 Gross per 24 hour  Intake 200 ml  Output --  Net 200 ml   Filed Weights   12/23/22 0021  Weight: 67.6 kg    Exam: Awake Alert, Oriented *3, No new F.N deficits, Normal affect Inger.AT,PERRAL Supple Neck,No JVD, No cervical lymphadenopathy appriciated.  Symmetrical Chest wall movement, Good air movement bilaterally, CTAB RRR,No Gallops,Rubs or new Murmurs, No Parasternal Heave +ve B.Sounds, Abd Soft, Non tender, No organomegaly appriciated, No rebound -guarding or rigidity. No Cyanosis, Clubbing or edema, No new Rash or bruise   PERTINENT RADIOLOGIC STUDIES: No results found.   PERTINENT LAB RESULTS: CBC: No results for input(s): "WBC", "HGB", "HCT", "PLT" in the last 72 hours. CMET CMP     Component Value Date/Time   NA 138 12/28/2022 0410    NA 144 07/20/2018 1433   K 3.6 12/28/2022 0410   CL 109 12/28/2022 0410   CO2 22 12/28/2022 0410   GLUCOSE 167 (H) 12/28/2022 0410   BUN 24 (H) 12/28/2022 0410   BUN 18 07/20/2018 1433   CREATININE 1.24 (H) 12/28/2022 0410   CALCIUM 9.2 12/28/2022 0410   PROT 6.5 12/22/2022 1541   PROT 7.3 07/20/2018 1433   ALBUMIN 3.4 (L) 12/22/2022 1541   ALBUMIN 4.8 07/20/2018 1433   AST 25 12/22/2022 1541   ALT 17 12/22/2022 1541   ALKPHOS 53 12/22/2022 1541   BILITOT 0.7 12/22/2022 1541   BILITOT 0.2 07/20/2018 1433   GFRNONAA 47 (L) 12/28/2022 0410    GFR Estimated Creatinine Clearance: 35.4 mL/min (A) (by C-G formula based on SCr of 1.24 mg/dL (H)). No results for input(s): "LIPASE", "AMYLASE" in the last 72 hours. No results for input(s): "CKTOTAL", "CKMB", "CKMBINDEX", "TROPONINI" in the last 72 hours. Invalid input(s): "POCBNP" No results for input(s): "DDIMER" in the last 72 hours. No results for input(s): "HGBA1C" in the last 72 hours. No results for input(s): "CHOL", "HDL", "LDLCALC", "TRIG", "CHOLHDL", "LDLDIRECT" in the last 72 hours. No results for input(s): "TSH", "T4TOTAL", "T3FREE", "THYROIDAB" in the last 72 hours.  Invalid input(s): "FREET3" No results for input(s): "VITAMINB12", "FOLATE", "FERRITIN", "TIBC", "IRON", "RETICCTPCT" in the last 72 hours. Coags: No results for input(s): "INR" in the last 72 hours.  Invalid input(s): "PT" Microbiology: Recent Results (from the past 240 hour(s))  Blood culture (routine x 2)     Status: None   Collection Time: 12/22/22  3:25 PM   Specimen: BLOOD  Result Value Ref Range Status   Specimen Description BLOOD RIGHT ANTECUBITAL  Final   Special Requests   Final    BOTTLES DRAWN AEROBIC AND ANAEROBIC Blood Culture adequate volume   Culture  Setup Time   Final    GRAM POSITIVE RODS AEROBIC BOTTLE ONLY CRITICAL RESULT CALLED TO, READ BACK BY AND VERIFIED WITH: PHARMD JESSICA CARNEY ON 12/24/22 @ 1917 BY DRT    Culture   Final     GRAM POSITIVE RODS UNABLE TO IDENTIFY GPR, PHARMD NOTIFIED PLEASE CALL MICRO LAB AT [phone] FOR FURTHER INFORMATION Performed at [facility], [address], [place], [place] [address]    Report Status 12/28/2022 FINAL  Final  Blood culture (routine x 2)     Status: None   Collection Time: 12/23/22 12:41 AM   Specimen:  BLOOD LEFT ARM  Result Value Ref Range Status   Specimen Description BLOOD LEFT ARM  Final   Special Requests   Final    BOTTLES DRAWN AEROBIC AND ANAEROBIC Blood Culture adequate volume   Culture   Final    NO GROWTH 5 DAYS Performed at [facility], [address], [place], [place] [address]    Report Status 12/28/2022 FINAL  Final  Culture, blood (Routine X 2) w Reflex to ID Panel     Status: None (Preliminary result)   Collection Time: 12/28/22  9:14 AM   Specimen: BLOOD LEFT ARM  Result Value Ref Range Status   Specimen Description BLOOD LEFT ARM  Final   Special Requests   Final    BOTTLES DRAWN AEROBIC AND ANAEROBIC Blood Culture adequate volume   Culture   Final    NO GROWTH < 24 HOURS Performed at [facility], [address] [place], [place] [address]    Report Status PENDING  Incomplete  Culture, blood (Routine X 2) w Reflex to ID Panel     Status: None (Preliminary result)   Collection Time: 12/28/22  9:14 AM   Specimen: BLOOD LEFT ARM  Result Value Ref Range Status   Specimen Description BLOOD LEFT ARM  Final   Special Requests   Final    BOTTLES DRAWN AEROBIC AND ANAEROBIC Blood Culture adequate volume   Culture   Final    NO GROWTH < 24 HOURS Performed at [facility], [address], [place], [place] [address]    Report Status PENDING  Incomplete    FURTHER DISCHARGE INSTRUCTIONS:  Get Medicines reviewed and adjusted: Please take all your medications with you for your next visit with your Primary MD  Laboratory/radiological data: Please request your Primary MD to go over all hospital tests and  procedure/radiological results at the follow up, please ask your Primary MD to get all Hospital records sent to his/her office.  In some cases, they will be blood work, cultures and biopsy results pending at the time of your discharge. Please request that your primary care M.D. goes through all the records of your hospital data and follows up on these results.  Also Note the following: If you experience worsening of your admission symptoms, develop shortness of breath, life threatening emergency, suicidal or homicidal thoughts you must seek medical attention immediately by calling 911 or calling your MD immediately  if symptoms less severe.  You must read complete instructions/literature along with all the possible adverse reactions/side effects for all the Medicines you take and that have been prescribed to you. Take any new Medicines after you have completely understood and accpet all the possible adverse reactions/side effects.   Do not drive when taking Pain medications or sleeping medications (Benzodaizepines)  Do not take more than prescribed Pain, Sleep and Anxiety Medications. It is not advisable to combine anxiety,sleep and pain medications without talking with your primary care practitioner  Special Instructions: If you have smoked or chewed Tobacco  in the last 2 yrs please stop smoking, stop any regular Alcohol  and or any Recreational drug use.  Wear Seat belts while driving.  Please note: You were cared for by a hospitalist during your hospital stay. Once you are discharged, your primary care physician will handle any further medical issues. Please note that NO REFILLS for any discharge medications will be authorized once you are discharged, as it is imperative that you return to your primary  care physician (or establish a relationship with a primary care physician if you do not have one) for your post hospital discharge needs so that they can reassess your need for medications and  monitor your lab values.  Total Time spent coordinating discharge including counseling, education and face to face time equals greater than 30 minutes.  SignedJeoffrey Massed 12/29/2022 9:10 AM

## 2022-12-29 NOTE — Progress Notes (Signed)
Physical Therapy Treatment Patient Details Name: Brianna Mitchell MRN: [id number] DOB: 1955-02-19 Today's Date: 12/29/2022   History of Present Illness 67 yo female presenting with AMS found to have severe sepsis secondary to aspiration pneumonia. PMH asthma, carpal tunnel, DM II, GERD, HTN, thyroid disease, cholecystectomy.    PT Comments  Pt tolerated treatment well today. Pt able to ambulate short distance in hallway with RW CGA. Pt continues to be limited by expressive aphasia. DC recs updated to SNF and family reports that cannot provide level of care required. PT will continue to follow.     If plan is discharge home, recommend the following: A little help with walking and/or transfers;A little help with bathing/dressing/bathroom;Assistance with cooking/housework;Direct supervision/assist for medications management;Direct supervision/assist for financial management;Assist for transportation;Help with stairs or ramp for entrance;Supervision due to cognitive status   Can travel by private vehicle     Yes  Equipment Recommendations  None recommended by PT    Recommendations for Other Services       Precautions / Restrictions Precautions Precautions: Fall Restrictions Weight Bearing Restrictions: No     Mobility  Bed Mobility               General bed mobility comments: Pt up in recliner    Transfers Overall transfer level: Needs assistance Equipment used: None Transfers: Sit to/from Stand Sit to Stand: Contact guard assist                Ambulation/Gait Ambulation/Gait assistance: Contact guard assist Gait Distance (Feet): 30 Feet Assistive device: None, IV Pole, Rolling walker (2 wheels) Gait Pattern/deviations: Decreased stride length, Step-through pattern, Drifts right/left Gait velocity: decreased     General Gait Details: Very slow speed, intermittently reaching for external support, mild unsteadiness   Stairs             Wheelchair  Mobility     Tilt Bed    Modified Rankin (Stroke Patients Only)       Balance Overall balance assessment: Needs assistance Sitting-balance support: Feet supported Sitting balance-Leahy Scale: Fair Sitting balance - Comments: in recliner not fully assessed   Standing balance support: No upper extremity supported, Bilateral upper extremity supported Standing balance-Leahy Scale: Fair Standing balance comment: Initially reaching for external support so opted for RW.                            Cognition Arousal: Alert Behavior During Therapy: WFL for tasks assessed/performed Overall Cognitive Status: No family/caregiver present to determine baseline cognitive functioning                                          Exercises      General Comments General comments (skin integrity, edema, etc.): VSS      Pertinent Vitals/Pain Pain Assessment Pain Assessment: No/denies pain    Home Living                          Prior Function            PT Goals (current goals can now be found in the care plan section) Progress towards PT goals: Progressing toward goals    Frequency    Min 1X/week      PT Plan      Co-evaluation  AM-PAC PT "6 Clicks" Mobility   Outcome Measure  Help needed turning from your back to your side while in a flat bed without using bedrails?: None Help needed moving from lying on your back to sitting on the side of a flat bed without using bedrails?: A Little Help needed moving to and from a bed to a chair (including a wheelchair)?: A Little Help needed standing up from a chair using your arms (e.g., wheelchair or bedside chair)?: A Little Help needed to walk in hospital room?: A Little Help needed climbing 3-5 steps with a railing? : A Lot 6 Click Score: 18    End of Session Equipment Utilized During Treatment: Gait belt Activity Tolerance: Patient tolerated treatment well Patient  left: in chair;with call bell/phone within reach;with chair alarm set Nurse Communication: Mobility status PT Visit Diagnosis: Other abnormalities of gait and mobility (R26.89)     Time: 5284-1324 PT Time Calculation (min) (ACUTE ONLY): 22 min  Charges:    $Gait Training: 8-22 mins PT General Charges $$ ACUTE PT VISIT: 1 Visit                     Shela Nevin, PT, DPT Acute Rehab Services [phone]    Brianna Mitchell 12/29/2022, 10:09 AM

## 2022-12-29 NOTE — TOC Progression Note (Signed)
Transition of Care [facility]) - Progression Note    Patient Details  Name: Brianna Mitchell MRN: [id number] Date of Birth: September 14, 1955  Transition of [facility]) CM/SW Contact  Mearl Latin, [place] Phone Number: 12/29/2022, 12:01 PM  Clinical Narrative:    Insurance approval received for [facility] SNF, effective 11/13 - 11/15, auth id: [id number].    Expected Discharge Plan: Skilled Nursing Facility Barriers to Discharge: Barriers Resolved  Expected Discharge Plan and Services         Expected Discharge Date: 12/29/22                                     Social Determinants of Health (SDOH) Interventions SDOH Screenings   Food Insecurity: No Food Insecurity (12/22/2022)  Housing: Low Risk  (12/22/2022)  Transportation Needs: No Transportation Needs (12/22/2022)  Utilities: Not At Risk (12/22/2022)  Depression (PHQ2-9): Low Risk  (04/12/2019)  Financial Resource Strain: Low Risk  (04/12/2019)  Physical Activity: Inactive (04/12/2019)  Social Connections: Unknown (03/17/2022)   Received from [facility], [facility]  Stress: No Stress Concern Present (04/12/2019)  Tobacco Use: Low Risk  (12/22/2022)    Readmission Risk Interventions     No data to display

## 2022-12-29 NOTE — Progress Notes (Signed)
Mobility Specialist Progress Note;   12/29/22 1040  Mobility  Activity Ambulated with assistance to bathroom  Level of Assistance Contact guard assist, steadying assist  Assistive Device Front wheel walker  Distance Ambulated (ft) 30 ft  Activity Response Tolerated well  Mobility Referral Yes  $Mobility charge 1 Mobility  Mobility Specialist Start Time (ACUTE ONLY) 1040  Mobility Specialist Stop Time (ACUTE ONLY) 1100  Mobility Specialist Time Calculation (min) (ACUTE ONLY) 20 min   Pt agreeable to mobility. Requesting assistance to the BR, void successful. Required minG assistance during ambulation for safety. Min directional verbal cues required during session. Asx throughout. Pt returned to bed with all needs met.   Caesar Bookman Mobility Specialist Please contact via SecureChat or Rehab Office [phone]

## 2022-12-31 LAB — VITAMIN B1: Vitamin B1 (Thiamine): 48.4 nmol/L — ABNORMAL LOW (ref 66.5–200.0)

## 2023-01-02 LAB — CULTURE, BLOOD (ROUTINE X 2)
Culture: NO GROWTH
Culture: NO GROWTH
Special Requests: ADEQUATE
Special Requests: ADEQUATE

## 2023-03-21 NOTE — Progress Notes (Signed)
 Chief Complaint  Patient presents with   Follow-up    Rm 1, alone.  Last seen 03/16/22.  Here to f/u about headaches. Reports she is having constant daily headaches. Ambulates with walker. Also has speech stutter. She is tearful. Had high blood glucose levels and went to hospital for treatment. Sx started after this.    HISTORY OF PRESENT ILLNESS:  03/23/23 ALL:  Brianna Mitchell returns for follow up for migraines. She was last seen 02/2022 and reported daily headaches. We switched her to Ajovy  from Emgality  and continued topiramate  100mg  BID. Follow up in 6 months advised. Insurance required trial of Qulipta .   Since, she reports headaches continue. She has a tension/throbbing headache every day. She does have light sensitivity but unclear how often. She tells me that she continued an injection after our last appt but does not remember the name. She reports injection was stopped after hospitalization 12/2022.   She was admitted to hospital 12/2022 for AMS, hypoglycemia, sepsis in setting of aspiration pna. She was found down at home by a friend. She was transferred to [facility] for rehab 02/27/2022. She continues to reside here. She is getting PT/OT and ST weekly. She continues to have difficulty with expressive aphasia. 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 She walks with walker. Sula Southerly, PCP.   03/16/2022 ALL:   [name] returns for follow up for migraines. She was last seen 04/2020 and Amovig switched to Emgality . Topiramate  100mg  BID continued. Since, She reports having daily headaches. No improvement on Emgality . She reports continuing meds consistently although she has not been seen in 2 years. She continues Percocet TID-QID and Opanas BID. She also takes Tylenol  but unclear how often. She reports having three people in her family die this month. She has been under more stress.   04/29/2020 ALL: Lauriann A Mitchell is a 68 y.o. female here today for follow up for  migraines. She was last seen 02/2019 by Dr Margaret who increased Amovig to 140mg  monthly and topiramate  to 100mg  BID. Tylenol  advised for abortive therapy. She does feel that it helped with intensity but she continues to have daily headaches. She describes a constant sharp stabbing pain all over her head. Occasionally throbbing with nausea and light/sound sensitivity. She admits to increased stress. Her mother recently suffered a heart attack. She lost several family members to Covid. Blood sugars have been elevated. Blood pressures are elevated. Last A1C was 12. She has appt with endocrinology next week. She does not sleep well. She wakes from dreams. She does snore.  She reports having a sleep study over 10 years ago. Unsure of results. She does not feel she can tolerate CPAP. She has chronic back pain. She is followed by Dr Burnetta. She takes Percocet 10-325 QID and Opana  30mg  BID.   HISTORY (copied from Dr Chancy previous note)  UPDATE (02/28/19, VRP): Since last visit, HA continue (all the time). Tolerating aimovig  and topiramate . Symptoms are stable. Severity is moderate. No alleviating or aggravating factors. Tolerating meds.   UPDATE (06/26/18, VRP): Since last visit, doing poorly. HA symptoms are continuing. Severity is high. No alleviating or aggravating factors. Tolerating meds. More stress. Had 3 family members die of COVID 69 (living in [place] and [place]).   UPDATE (11/16/16, VRP): Since last visit, doing poorly. More back pain, headaches, insomnia. Tolerating topiramate  and rizatriptan .   UPDATE 07/29/15: Since last visit, symptoms stable. Daily HA + intermittent headaches. Patient had to cancel  sleep study due to family issues.   UPDATE 01/01/15: Since last visit, still with daily HA. Also with 5 severe migraines per week.   PRIOR HPI (03/12/14): 68 year old ambidextrous female here for evaluation of headaches. Since age 18 years old patient has had intermittent global, severe throbbing  headaches, with blurred vision, irritability, nausea and photophobia and phonophobia. Around 2008 patient was evaluated by [facility], dx'd with migraine, and prescribed topiramate  25 mg twice a day. Patient then followed up with PCP who increased this to 50 mg twice a day for past 2-3 yrs. Patient continues to have multiple low-grade headaches per day. She has 3 severe migraine headaches per week. She typically tries to calm herself, relax, sometimes takes half tablet of generic Excedrin Migraine tablet. She reports allergy to aspirin and this medication has aspirin in it. She reports some itching sensation when she takes it. Patient reports remote sleep study testing more than 10 years ago but does not know the results. She is not on CPAP therapy. She has significant insomnia which she feels is related to her chronic back pain. She has snoring, obesity, HTN  and daytime sleepiness. No specific other triggering factors for headache.    REVIEW OF SYSTEMS: Out of a complete 14 system review of symptoms, the patient complains only of the following symptoms, headaches, chronic pain, snoring, imbalance, speech difficulty and all other reviewed systems are negative.    ALLERGIES: Allergies  Allergen Reactions   Aspirin Swelling   Sulfa Antibiotics Swelling   Demerol  Rash   Penicillins Rash     HOME MEDICATIONS: Outpatient Medications Prior to Visit  Medication Sig Dispense Refill   acetaminophen  (TYLENOL ) 325 MG tablet Take 650 mg by mouth in the morning and at bedtime.     amLODipine  (NORVASC ) 10 MG tablet Take 0.5 tablets (5 mg total) by mouth daily.     atorvastatin (LIPITOR) 80 MG tablet Take 80 mg by mouth daily.     desloratadine  (CLARINEX ) 5 MG tablet Take 1 tablet (5 mg total) by mouth daily. 90 tablet 0   DULoxetine (CYMBALTA) 60 MG capsule Take 60 mg by mouth every morning.     fluticasone  furoate-vilanterol (BREO ELLIPTA ) 100-25 MCG/ACT AEPB Inhale 1 puff into the  lungs daily.     gabapentin  (NEURONTIN ) 100 MG capsule Take 100 mg by mouth every 12 (twelve) hours.     Insulin  Lispro-aabc (LYUMJEV  KWIKPEN) 100 UNIT/ML KwikPen Inject into the skin. Sliding scale     levothyroxine  (SYNTHROID ) 75 MCG tablet Take 1 tablet (75 mcg total) by mouth daily before breakfast. 90 tablet 0   linagliptin  (TRADJENTA ) 5 MG TABS tablet Take 1 tablet (5 mg total) by mouth daily.     metFORMIN  (GLUCOPHAGE ) 500 MG tablet Take 1 tablet (500 mg total) by mouth daily with breakfast.     omeprazole (PRILOSEC) 40 MG capsule Take 40 mg by mouth daily.     oxyCODONE -acetaminophen  (PERCOCET/ROXICET) 5-325 MG tablet Take 1 tablet by mouth every 6 (six) hours as needed for moderate pain (pain score 4-6). 30 tablet 0   polyethylene glycol (MIRALAX  / GLYCOLAX ) packet Take 17 g by mouth daily. (Patient taking differently: Take 17 g by mouth daily as needed for mild constipation.) 24 each 0   topiramate  (TOPAMAX ) 100 MG tablet Take 1 tablet (100 mg total) by mouth 2 (two) times daily. 180 tablet 3   albuterol  (PROVENTIL ) (2.5 MG/3ML) 0.083% nebulizer solution Take 3 mLs (2.5 mg total) by nebulization every  6 (six) hours as needed for wheezing or shortness of breath. 75 mL 0   albuterol  (VENTOLIN  HFA) 108 (90 Base) MCG/ACT inhaler Inhale 2 puffs into the lungs every 6 (six) hours as needed for wheezing or shortness of breath. 8.5 g 0   dexlansoprazole  (DEXILANT ) 60 MG capsule Take 1 capsule (60 mg total) by mouth daily. 90 capsule 0   feeding supplement (ENSURE ENLIVE / ENSURE PLUS) LIQD Take 237 mLs by mouth 2 (two) times daily between meals. 237 mL 12   insulin  aspart (NOVOLOG  FLEXPEN) 100 UNIT/ML FlexPen 0-9 Units, Subcutaneous, 3 times daily with meals CBG < 70: Implement Hypoglycemia measures CBG 70 - 120: 0 units CBG 121 - 150: 1 unit CBG 151 - 200: 2 units CBG 201 - 250: 3 units CBG 251 - 300: 5 units CBG 301 - 350: 7 units CBG 351 - 400: 9 units CBG > 400: call MD     NARCAN  4 MG/0.1ML  LIQD nasal spray kit Place 0.4 mg into the nose once.     rosuvastatin  (CRESTOR ) 40 MG tablet TAKE ONE TABLET BY MOUTH EVERY DAY 90 tablet 0   No facility-administered medications prior to visit.     PAST MEDICAL HISTORY: Past Medical History:  Diagnosis Date   Anemia    Asthma    Back pain    02/2019 getting injections   Carpal tunnel syndrome    Diabetes mellitus    type 2    Fibroid tumor    Ganglion cyst    GERD (gastroesophageal reflux disease)    will see Dr. Patrcia 04/2018   Headache(784.0)    Heart murmur    saw dr. ladona (only sees if needed, not seen since 2011)   Hypertension    dr ladona      Hypothyroidism    Shortness of breath    on exertion    Shoulder pain    Tendonitis    left shoulder biceps tendonitis, acromioclavicular osteoarthritis   Tennis elbow    Thyroid  disease      PAST SURGICAL HISTORY: Past Surgical History:  Procedure Laterality Date   ABDOMINAL HYSTERECTOMY     ANTERIOR LAT LUMBAR FUSION N/A 01/03/2013   Procedure: X LIF (Extreme Lateral Interbody Fusion) L3-L4,  (1 LEVEL) ;  Surgeon: Donaciano Sprang, MD;  Location: [facility] OR;  Service: Orthopedics;  Laterality: N/A;   BACK SURGERY     x5   CARDIAC CATHETERIZATION     CHOLECYSTECTOMY     COLONOSCOPY  04/18/2017   Dr Renaye Sous ; [facility]    EYE SURGERY  05/23/2017   laser of right eye, to have left eye done on 06-13-17 - for cataracts    KNEE ARTHROSCOPY Right 04/2014   LAPAROSCOPY N/A 05/30/2017   Procedure: LAPAROSCOPY DIAGNOSTIC;  Surgeon: Tanda Locus, MD;  Location: [facility] ORS;  Service: General;  Laterality: N/A;   LEFT HEART CATHETERIZATION WITH CORONARY ANGIOGRAM N/A 04/27/2011   Procedure: LEFT HEART CATHETERIZATION WITH CORONARY ANGIOGRAM;  Surgeon: Erick JONELLE Ladona, MD;  Location[facility];  Service: Cardiovascular;  Laterality: N/A;   pelvic bone removed     ROTATOR CUFF REPAIR Left    x3 surgeries; Dr Glendia Dean.[facility]    SHOULDER ARTHROSCOPY  WITH SUBACROMIAL DECOMPRESSION, ROTATOR CUFF REPAIR AND BICEP TENDON REPAIR Left 02/03/2016   Procedure: SHOULDER ARTHROSCOPY WITH SUBACROMIAL DECOMPRESSION, DEBRIDEMENT, BICEPS TENODESIS, DISTAL CLAVICLE EXCISION.;  Surgeon: Glendia Cordella Hutchinson, MD;  Location: [facility] OR;  Service: Orthopedics;  Laterality: Left;   THYROID  SURGERY     LUMP REMOVED   TUBAL LIGATION       FAMILY HISTORY: Family History  Problem Relation Age of Onset   Heart disease Mother    Heart disease Father    Breast cancer Sister    Heart disease Sister    Heart disease Brother    Heart disease Paternal Uncle      SOCIAL HISTORY: Social History   Socioeconomic History   Marital status: Divorced    Spouse name: Not on file   Number of children: Not on file   Years of education: Not on file   Highest education level: Not on file  Occupational History   Occupation: disability  Tobacco Use   Smoking status: Never   Smokeless tobacco: Never  Vaping Use   Vaping status: Never Used  Substance and Sexual Activity   Alcohol use: No   Drug use: No   Sexual activity: Yes  Other Topics Concern   Not on file  Social History Narrative   Not on file   Social Drivers of Health   Financial Resource Strain: Low Risk  (04/12/2019)   Overall Financial Resource Strain (CARDIA)    Difficulty of Paying Living Expenses: Not hard at all  Food Insecurity: No Food Insecurity (12/22/2022)   Hunger Vital Sign    Worried About Running Out of Food in the Last Year: Never true    Ran Out of Food in the Last Year: Never true  Transportation Needs: No Transportation Needs (12/22/2022)   PRAPARE - Administrator, Civil Service (Medical): No    Lack of Transportation (Non-Medical): No  Physical Activity: Inactive (04/12/2019)   Exercise Vital Sign    Days of Exercise per Week: 0 days    Minutes of Exercise per Session: 0 min  Stress: No Stress Concern Present (04/12/2019)   [employer] of Occupational [employer] -  Occupational Stress Questionnaire    Feeling of Stress : Not at all  Social Connections: Unknown (03/17/2022)   Received from [facility], [facility]   Social Network    Social Network: Not on file  Intimate Partner Violence: Not At Risk (12/22/2022)   Humiliation, Afraid, Rape, and Kick questionnaire    Fear of Current or Ex-Partner: No    Emotionally Abused: No    Physically Abused: No    Sexually Abused: No      PHYSICAL EXAM  Vitals:   03/23/23 1232  BP: 137/81  Pulse: 91  Weight: 143 lb (64.9 kg)  Height: 4' 10 (1.473 m)     Body mass index is 29.89 kg/m.   Generalized: Well developed, in no acute distress  Cardiology: normal rate and rhythm, no murmur auscultated  Respiratory: clear to auscultation bilaterally    Neurological examination  Mentation: Alert oriented to time, place, and most history taking. Follows all commands. Expressive aphasia and frequent stuttering noted.  Cranial nerve II-XII: Pupils were equal round reactive to light. Extraocular movements were full, visual field were full on confrontational test. Facial sensation and strength were normal. Head turning and shoulder shrug  were normal and symmetric. Motor: The motor testing reveals 5 over 5 strength of all 4 extremities. Good symmetric motor tone is noted throughout.  Gait and station: Able to push to standing position independently. Gait is stable with walker. Tandem not attempted.     DIAGNOSTIC DATA (LABS, IMAGING, TESTING) - I reviewed patient records, labs, notes, testing and imaging  myself where available.  Lab Results  Component Value Date   WBC 10.7 (H) 12/24/2022   HGB 10.0 (L) 12/24/2022   HCT 31.0 (L) 12/24/2022   MCV 88.6 12/24/2022   PLT 272 12/24/2022      Component Value Date/Time   NA 138 12/28/2022 0410   NA 144 07/20/2018 1433   K 3.6 12/28/2022 0410   CL 109 12/28/2022 0410   CO2 22 12/28/2022 0410   GLUCOSE 167 (H) 12/28/2022 0410   BUN 24 (H) 12/28/2022  0410   BUN 18 07/20/2018 1433   CREATININE 1.24 (H) 12/28/2022 0410   CALCIUM  9.2 12/28/2022 0410   PROT 6.5 12/22/2022 1541   PROT 7.3 07/20/2018 1433   ALBUMIN 3.4 (L) 12/22/2022 1541   ALBUMIN 4.8 07/20/2018 1433   AST 25 12/22/2022 1541   ALT 17 12/22/2022 1541   ALKPHOS 53 12/22/2022 1541   BILITOT 0.7 12/22/2022 1541   BILITOT 0.2 07/20/2018 1433   GFRNONAA 47 (L) 12/28/2022 0410   GFRAA 50 (L) 07/20/2018 1433   Lab Results  Component Value Date   CHOL 178 03/08/2018   HDL 86 03/08/2018   LDLCALC 81 03/08/2018   TRIG 55 03/08/2018   CHOLHDL 2.1 03/08/2018   Lab Results  Component Value Date   HGBA1C 8.3 (H) 12/24/2022   Lab Results  Component Value Date   VITAMINB12 725 12/25/2022   Lab Results  Component Value Date   TSH 2.577 12/22/2022        No data to display               No data to display           ASSESSMENT AND PLAN  68 y.o. year old female  has a past medical history of Anemia, Asthma, Back pain, Carpal tunnel syndrome, Diabetes mellitus, Fibroid tumor, Ganglion cyst, GERD (gastroesophageal reflux disease), Headache(784.0), Heart murmur, Hypertension, Hypothyroidism, Shortness of breath, Shoulder pain, Tendonitis, Tennis elbow, and Thyroid  disease. here with   Basal ganglia stroke (HCC)  Intractable migraine without aura and without status migrainosus  Sepsis due to pneumonia (HCC)  Jaycie continues to have daily headaches. Most are generalized and throbbing without migrainous features but she does endorse intermittent nausea, photo and phonophobia. Unclear how often she has migraine symptoms. We will start Qulipta  60mg  daily. She will continue topiramate  100mg  twice daily and Tylenol  for abortive therapy. She will continue close follow up with PCP and specialists as directed. Continue PT/OT/ST per facility recommendations. Fall precautions advised. Stroke precautions advised. Healthy lifestyle habits encouraged. She will follow up in 6  months. She verbalizes understanding and agreement with this plan.    No orders of the defined types were placed in this encounter.    Meds ordered this encounter  Medications   Atogepant  (QULIPTA ) 60 MG TABS    Sig: Take 1 tablet (60 mg total) by mouth daily.    Dispense:  90 tablet    Refill:  3    Supervising Provider:   INES ONETHA NOVAK [[id number]]     Greig Forbes, MSN, FNP-C 03/23/2023, 1:55 PM  [facility] [address], [address] [place], [place] [address] [phone]

## 2023-03-21 NOTE — Patient Instructions (Signed)
 Below is our plan:  We will continue topiramate  100mg  twice daily. We will add Qulipta  60mg  daily for headache management. I have called the prescription to [facility]. May take Tylenol  1000mg  every 6 hours as needed for migraine abortion.   Please make sure you are staying well hydrated. I recommend 50-60 ounces daily. Well balanced diet and regular exercise encouraged. Consistent sleep schedule with 6-8 hours recommended.   Please continue follow up with care team as directed.   Follow up with me in 6 months   You may receive a survey regarding today's visit. I encourage you to leave honest feed back as I do use this information to improve patient care. Thank you for seeing me today!

## 2023-03-23 ENCOUNTER — Ambulatory Visit (INDEPENDENT_AMBULATORY_CARE_PROVIDER_SITE_OTHER): Payer: 59 | Admitting: Family Medicine

## 2023-03-23 ENCOUNTER — Encounter: Payer: Self-pay | Admitting: Family Medicine

## 2023-03-23 VITALS — BP 137/81 | HR 91 | Ht <= 58 in | Wt 143.0 lb

## 2023-03-23 DIAGNOSIS — G43019 Migraine without aura, intractable, without status migrainosus: Secondary | ICD-10-CM | POA: Diagnosis not present

## 2023-03-23 DIAGNOSIS — I6381 Other cerebral infarction due to occlusion or stenosis of small artery: Secondary | ICD-10-CM

## 2023-03-23 DIAGNOSIS — A419 Sepsis, unspecified organism: Secondary | ICD-10-CM | POA: Diagnosis not present

## 2023-03-23 DIAGNOSIS — J189 Pneumonia, unspecified organism: Secondary | ICD-10-CM

## 2023-03-23 MED ORDER — QULIPTA 60 MG PO TABS
60.0000 mg | ORAL_TABLET | Freq: Every day | ORAL | 3 refills | Status: DC
Start: 2023-03-23 — End: 2023-04-21

## 2023-03-30 ENCOUNTER — Other Ambulatory Visit: Payer: Self-pay

## 2023-03-30 MED ORDER — TOPIRAMATE 100 MG PO TABS: 100.0000 mg | ORAL_TABLET | Freq: Two times a day (BID) | ORAL | 3 refills | Status: DC

## 2023-04-07 ENCOUNTER — Other Ambulatory Visit: Payer: Self-pay | Admitting: *Deleted

## 2023-04-07 MED ORDER — TOPIRAMATE 100 MG PO TABS: 100.0000 mg | ORAL_TABLET | Freq: Two times a day (BID) | ORAL | 1 refills | Status: AC

## 2023-04-21 ENCOUNTER — Other Ambulatory Visit: Payer: Self-pay

## 2023-04-21 MED ORDER — QULIPTA 60 MG PO TABS: 60.0000 mg | ORAL_TABLET | Freq: Every day | ORAL | 3 refills | Status: AC

## 2023-09-28 ENCOUNTER — Encounter: Payer: Self-pay | Admitting: Family Medicine

## 2023-09-28 ENCOUNTER — Ambulatory Visit: Payer: 59 | Admitting: Family Medicine

## 2023-09-28 NOTE — Progress Notes (Deleted)
 No chief complaint on file.   HISTORY OF PRESENT ILLNESS:  09/28/23 ALL:  Brianna Mitchell returns for follow up for migraines and history of CVA. She was last seen 03/2023 and we started Qulipta .   Atorvastatin? Asa? PCP?  03/23/2023 ALL:  Brianna Mitchell returns for follow up for migraines. She was last seen 02/2022 and reported daily headaches. We switched her to Ajovy  from Emgality  and continued topiramate  100mg  BID. Follow up in 6 months advised. Insurance required trial of Qulipta .   Since, she reports headaches continue. She has a tension/throbbing headache every day. She does have light sensitivity but unclear how often. She tells me that she continued an injection after our last appt but does not remember the name. She reports injection was stopped after hospitalization 12/2022.   She was admitted to hospital 12/2022 for AMS, hypoglycemia, sepsis in setting of aspiration pna. She was found down at home by a friend. She was transferred to [facility] for rehab 02/27/2022. She continues to reside here. She is getting PT/OT and ST weekly. She continues to have difficulty with expressive aphasia. 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 She walks with walker. Brianna Mitchell, PCP.   03/16/2022 ALL:   Brianna Mitchell returns for follow up for migraines. She was last seen 04/2020 and Amovig switched to Emgality . Topiramate  100mg  BID continued. Since, She reports having daily headaches. No improvement on Emgality . She reports continuing meds consistently although she has not been seen in 2 years. She continues Percocet TID-QID and Opanas BID. She also takes Tylenol  but unclear how often. She reports having three people in her family die this month. She has been under more stress.   04/29/2020 ALL: Brianna Mitchell is a 68 y.o. female here today for follow up for migraines. She was last seen 02/2019 by Dr Margaret who increased Amovig to 140mg  monthly and topiramate  to 100mg  BID. Tylenol  advised  for abortive therapy. She does feel that it helped with intensity but she continues to have daily headaches. She describes a constant sharp stabbing pain all over her head. Occasionally throbbing with nausea and light/sound sensitivity. She admits to increased stress. Her mother recently suffered a heart attack. She lost several family members to Covid. Blood sugars have been elevated. Blood pressures are elevated. Last A1C was 12. She has appt with endocrinology next week. She does not sleep well. She wakes from dreams. She does snore.  She reports having a sleep study over 10 years ago. Unsure of results. She does not feel she can tolerate CPAP. She has chronic back pain. She is followed by Dr Burnetta. She takes Percocet 10-325 QID and Opana  30mg  BID.   HISTORY (copied from Dr Chancy previous note)  UPDATE (02/28/19, VRP): Since last visit, HA continue (all the time). Tolerating aimovig  and topiramate . Symptoms are stable. Severity is moderate. No alleviating or aggravating factors. Tolerating meds.   UPDATE (06/26/18, VRP): Since last visit, doing poorly. HA symptoms are continuing. Severity is high. No alleviating or aggravating factors. Tolerating meds. More stress. Had 3 family members die of COVID 75 (living in [place] and [place]).   UPDATE (11/16/16, VRP): Since last visit, doing poorly. More back pain, headaches, insomnia. Tolerating topiramate  and rizatriptan .   UPDATE 07/29/15: Since last visit, symptoms stable. Daily HA + intermittent headaches. Patient had to cancel sleep study due to family issues.   UPDATE 01/01/15: Since last visit, still with daily HA. Also with 5 severe migraines per week.  PRIOR HPI (03/12/14): 68 year old ambidextrous female here for evaluation of headaches. Since age 46 years old patient has had intermittent global, severe throbbing headaches, with blurred vision, irritability, nausea and photophobia and phonophobia. Around 2008 patient was evaluated by headache and  wellness center, dx'd with migraine, and prescribed topiramate  25 mg twice a day. Patient then followed up with PCP who increased this to 50 mg twice a day for past 2-3 yrs. Patient continues to have multiple low-grade headaches per day. She has 3 severe migraine headaches per week. She typically tries to calm herself, relax, sometimes takes half tablet of generic Excedrin Migraine tablet. She reports allergy to aspirin and this medication has aspirin in it. She reports some itching sensation when she takes it. Patient reports remote sleep study testing more than 10 years ago but does not know the results. She is not on CPAP therapy. She has significant insomnia which she feels is related to her chronic back pain. She has snoring, obesity, HTN  and daytime sleepiness. No specific other triggering factors for headache.    REVIEW OF SYSTEMS: Out of a complete 14 system review of symptoms, the patient complains only of the following symptoms, headaches, chronic pain, snoring, imbalance, speech difficulty and all other reviewed systems are negative.    ALLERGIES: Allergies  Allergen Reactions   Aspirin Swelling   Sulfa Antibiotics Swelling   Demerol  Rash   Penicillins Rash     HOME MEDICATIONS: Outpatient Medications Prior to Visit  Medication Sig Dispense Refill   acetaminophen  (TYLENOL ) 325 MG tablet Take 650 mg by mouth in the morning and at bedtime.     amLODipine  (NORVASC ) 10 MG tablet Take 0.5 tablets (5 mg total) by mouth daily.     Atogepant  (QULIPTA ) 60 MG TABS Take 1 tablet (60 mg total) by mouth daily. 90 tablet 3   atorvastatin (LIPITOR) 80 MG tablet Take 80 mg by mouth daily.     desloratadine  (CLARINEX ) 5 MG tablet Take 1 tablet (5 mg total) by mouth daily. 90 tablet 0   DULoxetine (CYMBALTA) 60 MG capsule Take 60 mg by mouth every morning.     fluticasone  furoate-vilanterol (BREO ELLIPTA ) 100-25 MCG/ACT AEPB Inhale 1 puff into the lungs daily.     gabapentin  (NEURONTIN ) 100 MG  capsule Take 100 mg by mouth every 12 (twelve) hours.     Insulin  Lispro-aabc (LYUMJEV  KWIKPEN) 100 UNIT/ML KwikPen Inject into the skin. Sliding scale     levothyroxine  (SYNTHROID ) 75 MCG tablet Take 1 tablet (75 mcg total) by mouth daily before breakfast. 90 tablet 0   linagliptin  (TRADJENTA ) 5 MG TABS tablet Take 1 tablet (5 mg total) by mouth daily.     metFORMIN  (GLUCOPHAGE ) 500 MG tablet Take 1 tablet (500 mg total) by mouth daily with breakfast.     omeprazole (PRILOSEC) 40 MG capsule Take 40 mg by mouth daily.     oxyCODONE -acetaminophen  (PERCOCET/ROXICET) 5-325 MG tablet Take 1 tablet by mouth every 6 (six) hours as needed for moderate pain (pain score 4-6). 30 tablet 0   polyethylene glycol (MIRALAX  / GLYCOLAX ) packet Take 17 g by mouth daily. (Patient taking differently: Take 17 g by mouth daily as needed for mild constipation.) 24 each 0   topiramate  (TOPAMAX ) 100 MG tablet Take 1 tablet (100 mg total) by mouth 2 (two) times daily. 180 tablet 1   No facility-administered medications prior to visit.     PAST MEDICAL HISTORY: Past Medical History:  Diagnosis Date   Anemia  Asthma    Back pain    02/2019 getting injections   Carpal tunnel syndrome    Diabetes mellitus    type 2    Fibroid tumor    Ganglion cyst    GERD (gastroesophageal reflux disease)    will see Dr. Patrcia 04/2018   Headache(784.0)    Heart murmur    saw dr. ladona (only sees if needed, not seen since 2011)   Hypertension    dr ladona      Hypothyroidism    Shortness of breath    on exertion    Shoulder pain    Tendonitis    left shoulder biceps tendonitis, acromioclavicular osteoarthritis   Tennis elbow    Thyroid  disease      PAST SURGICAL HISTORY: Past Surgical History:  Procedure Laterality Date   ABDOMINAL HYSTERECTOMY     ANTERIOR LAT LUMBAR FUSION N/A 01/03/2013   Procedure: X LIF (Extreme Lateral Interbody Fusion) L3-L4,  (1 LEVEL) ;  Surgeon: Donaciano Sprang, MD;  Location: [facility] OR;   Service: Orthopedics;  Laterality: N/A;   BACK SURGERY     x5   CARDIAC CATHETERIZATION     CHOLECYSTECTOMY     COLONOSCOPY  04/18/2017   Dr Renaye Sous ; [facility]    EYE SURGERY  05/23/2017   laser of right eye, to have left eye done on 06-13-17 - for cataracts    KNEE ARTHROSCOPY Right 04/2014   LAPAROSCOPY N/A 05/30/2017   Procedure: LAPAROSCOPY DIAGNOSTIC;  Surgeon: Tanda Locus, MD;  Location: [facility] ORS;  Service: General;  Laterality: N/A;   LEFT HEART CATHETERIZATION WITH CORONARY ANGIOGRAM N/A 04/27/2011   Procedure: LEFT HEART CATHETERIZATION WITH CORONARY ANGIOGRAM;  Surgeon: Erick JONELLE ladona, MD;  Location: [facility] CATH LAB;  Service: Cardiovascular;  Laterality: N/A;   pelvic bone removed     ROTATOR CUFF REPAIR Left    x3 surgeries; Dr Glendia Dean.[facility]    SHOULDER ARTHROSCOPY WITH SUBACROMIAL DECOMPRESSION, ROTATOR CUFF REPAIR AND BICEP TENDON REPAIR Left 02/03/2016   Procedure: SHOULDER ARTHROSCOPY WITH SUBACROMIAL DECOMPRESSION, DEBRIDEMENT, BICEPS TENODESIS, DISTAL CLAVICLE EXCISION.;  Surgeon: Glendia Cordella Hutchinson, MD;  Location: [facility] OR;  Service: Orthopedics;  Laterality: Left;   THYROID  SURGERY     LUMP REMOVED   TUBAL LIGATION       FAMILY HISTORY: Family History  Problem Relation Age of Onset   Heart disease Mother    Heart disease Father    Breast cancer Sister    Heart disease Sister    Heart disease Brother    Heart disease Paternal Uncle      SOCIAL HISTORY: Social History   Socioeconomic History   Marital status: Divorced    Spouse name: Not on file   Number of children: Not on file   Years of education: Not on file   Highest education level: Not on file  Occupational History   Occupation: disability  Tobacco Use   Smoking status: Never   Smokeless tobacco: Never  Vaping Use   Vaping status: Never Used  Substance and Sexual Activity   Alcohol use: No   Drug use: No   Sexual activity: Yes  Other Topics Concern   Not  on file  Social History Narrative   Not on file   Social Drivers of Health   Financial Resource Strain: Low Risk  (04/12/2019)   Overall Financial Resource Strain (CARDIA)    Difficulty of Paying Living Expenses: Not hard at all  Food Insecurity:  No Food Insecurity (12/22/2022)   Hunger Vital Sign    Worried About Running Out of Food in the Last Year: Never true    Ran Out of Food in the Last Year: Never true  Transportation Needs: No Transportation Needs (12/22/2022)   PRAPARE - Administrator, Civil Service (Medical): No    Lack of Transportation (Non-Medical): No  Physical Activity: Inactive (04/12/2019)   Exercise Vital Sign    Days of Exercise per Week: 0 days    Minutes of Exercise per Session: 0 min  Stress: No Stress Concern Present (04/12/2019)   [employer] of Occupational Health - Occupational Stress Questionnaire    Feeling of Stress : Not at all  Social Connections: Unknown (03/17/2022)   [facility] from [facility]   Social Network    Social Network: Not on file  Intimate Partner Violence: Not At Risk (12/22/2022)   Humiliation, Afraid, Rape, and Kick questionnaire    Fear of Current or Ex-Partner: No    Emotionally Abused: No    Physically Abused: No    Sexually Abused: No      PHYSICAL EXAM  There were no vitals filed for this visit.    There is no height or weight on file to calculate BMI.   Generalized: Well developed, in no acute distress  Cardiology: normal rate and rhythm, no murmur auscultated  Respiratory: clear to auscultation bilaterally    Neurological examination  Mentation: Alert oriented to time, place, and most history taking. Follows all commands. Expressive aphasia and frequent stuttering noted.  Cranial nerve II-XII: Pupils were equal round reactive to light. Extraocular movements were full, visual field were full on confrontational test. Facial sensation and strength were normal. Head turning and shoulder shrug  were  normal and symmetric. Motor: The motor testing reveals 5 over 5 strength of all 4 extremities. Good symmetric motor tone is noted throughout.  Gait and station: Able to push to standing position independently. Gait is stable with walker. Tandem not attempted.     DIAGNOSTIC DATA (LABS, IMAGING, TESTING) - I reviewed patient records, labs, notes, testing and imaging myself where available.  Lab Results  Component Value Date   WBC 10.7 (H) 12/24/2022   HGB 10.0 (L) 12/24/2022   HCT 31.0 (L) 12/24/2022   MCV 88.6 12/24/2022   PLT 272 12/24/2022      Component Value Date/Time   NA 138 12/28/2022 0410   NA 144 07/20/2018 1433   K 3.6 12/28/2022 0410   CL 109 12/28/2022 0410   CO2 22 12/28/2022 0410   GLUCOSE 167 (H) 12/28/2022 0410   BUN 24 (H) 12/28/2022 0410   BUN 18 07/20/2018 1433   CREATININE 1.24 (H) 12/28/2022 0410   CALCIUM  9.2 12/28/2022 0410   PROT 6.5 12/22/2022 1541   PROT 7.3 07/20/2018 1433   ALBUMIN 3.4 (L) 12/22/2022 1541   ALBUMIN 4.8 07/20/2018 1433   AST 25 12/22/2022 1541   ALT 17 12/22/2022 1541   ALKPHOS 53 12/22/2022 1541   BILITOT 0.7 12/22/2022 1541   BILITOT 0.2 07/20/2018 1433   GFRNONAA 47 (L) 12/28/2022 0410   GFRAA 50 (L) 07/20/2018 1433   Lab Results  Component Value Date   CHOL 178 03/08/2018   HDL 86 03/08/2018   LDLCALC 81 03/08/2018   TRIG 55 03/08/2018   CHOLHDL 2.1 03/08/2018   Lab Results  Component Value Date   HGBA1C 8.3 (H) 12/24/2022   Lab Results  Component Value Date  CPUJFPWA87 725 12/25/2022   Lab Results  Component Value Date   TSH 2.577 12/22/2022        No data to display               No data to display           ASSESSMENT AND PLAN  68 y.o. year old female  has a past medical history of Anemia, Asthma, Back pain, Carpal tunnel syndrome, Diabetes mellitus, Fibroid tumor, Ganglion cyst, GERD (gastroesophageal reflux disease), Headache(784.0), Heart murmur, Hypertension, Hypothyroidism,  Shortness of breath, Shoulder pain, Tendonitis, Tennis elbow, and Thyroid  disease. here with   No diagnosis found.  Demetri continues to have daily headaches. Most are generalized and throbbing without migrainous features but she does endorse intermittent nausea, photo and phonophobia. Unclear how often she has migraine symptoms. We will start Qulipta  60mg  daily. She will continue topiramate  100mg  twice daily and Tylenol  for abortive therapy. She will continue close follow up with PCP and specialists as directed. Continue PT/OT/ST per facility recommendations. Fall precautions advised. Stroke precautions advised. Healthy lifestyle habits encouraged. She will follow up in 6 months. She verbalizes understanding and agreement with this plan.    No orders of the defined types were placed in this encounter.    No orders of the defined types were placed in this encounter.    Greig Forbes, MSN, FNP-C 09/28/2023, 10:34 AM  [facility] [address] [place], [place] [address] [phone]

## 2023-09-28 NOTE — Patient Instructions (Incomplete)
 Below is our plan:  Goals:  1) Maintain strict control of hypertension with blood pressure goal below 130/90 2) Maintain good control of diabetes with hemoglobin A1c goal below 7%  3) Maintain good control of lipids with LDL cholesterol goal below 70 mg/dL.  4) Eat a healthy diet with plenty of whole grains, cereals, fruits and vegetables, exercise regularly and maintain ideal body weight   Resources: https://www.williams.biz/  Please make sure you are staying well hydrated. I recommend 50-60 ounces daily. Well balanced diet and regular exercise encouraged. Consistent sleep schedule with 6-8 hours recommended.   Please continue follow up with care team as directed.   Follow up with *** in ***  You may receive a survey regarding today's visit. I encourage you to leave honest feed back as I do use this information to improve patient care. Thank you for seeing me today!     GENERAL HEADACHE INFORMATION:   Natural supplements: Magnesium  Oxide or Magnesium  Glycinate 500 mg at bed (up to 800 mg daily) Coenzyme Q10 300 mg in AM Vitamin B2- 200 mg twice a day   Add 1 supplement at a time since even natural supplements can have undesirable side effects. You can sometimes buy supplements cheaper (especially Coenzyme Q10) at www.WebmailGuide.co.za or at [facility]  Migraine with aura: There is increased risk for stroke in women with migraine with aura and a contraindication for the combined contraceptive pill for use by women who have migraine with aura. The risk for women with migraine without aura is lower. However other risk factors like smoking are far more likely to increase stroke risk than migraine. There is a recommendation for no smoking and for the use of OCPs without estrogen such as progestogen only pills particularly for women with migraine with aura.SABRA People who have migraine headaches with auras may be 3 times more likely to have  a stroke caused by a blood clot, compared to migraine patients who don't see auras. Women who take hormone-replacement therapy may be 30 percent more likely to suffer a clot-based stroke than women not taking medication containing estrogen. Other risk factors like smoking and high blood pressure may be  much more important.    Vitamins and herbs that show potential:   Magnesium : Magnesium  (250 mg twice a day or 500 mg at bed) has a relaxant effect on smooth muscles such as blood vessels. Individuals suffering from frequent or daily headache usually have low magnesium  levels which can be increase with daily supplementation of 400-750 mg. Three trials found 40-90% average headache reduction  when used as a preventative. Magnesium  may help with headaches are aura, the best evidence for magnesium  is for migraine with aura is its thought to stop the cortical spreading depression we believe is the pathophysiology of migraine aura.Magnesium  also demonstrated the benefit in menstrually related migraine.  Magnesium  is part of the messenger system in the serotonin cascade and it is a good muscle relaxant.  It is also useful for constipation which can be a side effect of other medications used to treat migraine. Good sources include nuts, whole grains, and tomatoes. Side Effects: loose stool/diarrhea  Riboflavin (vitamin B 2) 200 mg twice a day. This vitamin assists nerve cells in the production of ATP a principal energy storing molecule.  It is necessary for many chemical reactions in the body.  There have been at least 3 clinical trials of riboflavin using 400 mg per day all of which suggested that migraine frequency can be decreased.  All 3 trials showed significant improvement in over half of migraine sufferers.  The supplement is found in bread, cereal, milk, meat, and poultry.  Most Americans get more riboflavin than the recommended daily allowance, however riboflavin deficiency is not necessary for the supplements  to help prevent headache. Side effects: energizing, green urine   Coenzyme Q10: This is present in almost all cells in the body and is critical component for the conversion of energy.  Recent studies have shown that a nutritional supplement of CoQ10 can reduce the frequency of migraine attacks by improving the energy production of cells as with riboflavin.  Doses of 150 mg twice a day have been shown to be effective.   Melatonin: Increasing evidence shows correlation between melatonin secretion and headache conditions.  Melatonin supplementation has decreased headache intensity and duration.  It is widely used as a sleep aid.  Sleep is natures way of dealing with migraine.  A dose of 3 mg is recommended to start for headaches including cluster headache. Higher doses up to 15 mg has been reviewed for use in Cluster headache and have been used. The rationale behind using melatonin for cluster is that many theories regarding the cause of Cluster headache center around the disruption of the normal circadian rhythm in the brain.  This helps restore the normal circadian rhythm.   HEADACHE DIET: Foods and beverages which may trigger migraine Note that only 20% of headache patients are food sensitive. You will know if you are food sensitive if you get a headache consistently 20 minutes to 2 hours after eating a certain food. Only cut out a food if it causes headaches, otherwise you might remove foods you enjoy! What matters most for diet is to eat a well balanced healthy diet full of vegetables and low fat protein, and to not miss meals.   Chocolate, other sweets ALL cheeses except cottage and cream cheese Dairy products, yogurt, sour cream, ice cream Liver Meat extracts (Bovril, Marmite, meat tenderizers) Meats or fish which have undergone aging, fermenting, pickling or smoking. These include: Hotdogs,salami,Lox,sausage, mortadellas,smoked salmon, pepperoni, Pickled herring Pods of broad bean (English  beans, Chinese pea pods, [place] (fava) beans, lima and navy beans Ripe avocado, ripe banana Yeast extracts or active yeast preparations such as Brewer's or Fleishman's (commercial bakes goods are permitted) Tomato based foods, pizza (lasagna, etc.)   MSG (monosodium glutamate) is disguised as many things; look for these common aliases: Monopotassium glutamate Autolysed yeast Hydrolysed protein Sodium caseinate "flavorings" "all natural preservatives Nutrasweet   Avoid all other foods that convincingly provoke headaches.   Resources: The Dizzy Zuleica, Seith Your Headache Diet, migrainestrong.com  https://zamora-andrews.com/   Caffeine and Migraine For patients that have migraine, caffeine intake more than 3 days per week can lead to dependency and increased migraine frequency. I would recommend cutting back on your caffeine intake as best you can. The recommended amount of caffeine is 200-300 mg daily, although migraine patients may experience dependency at even lower doses. While you may notice an increase in headache temporarily, cutting back will be helpful for headaches in the long run. For more information on caffeine and migraine, visit: https://americanmigrainefoundation.org/resource-library/caffeine-and-migraine/   Headache Prevention Strategies:   1. Maintain a headache diary; learn to identify and avoid triggers.  - This can be a simple note where you log when you had a headache, associated symptoms, and medications used - There are several smartphone apps developed to help track migraines: Migraine Buddy, Migraine Monitor, Curelator N1-Headache App  Common triggers include: Emotional triggers: Emotional/Upset family or friends Emotional/Upset occupation Business reversal/success Anticipation anxiety Crisis-serious Post-crisis periodNew job/position   Physical triggers: Vacation Day Weekend Strenuous Exercise High  Altitude Location New Move Menstrual Day Physical Illness Oversleep/Not enough sleep Weather changes Light: Photophobia or light sesnitivity treatment involves a balance between desensitization and reduction in overly strong input. Use dark polarized glasses outside, but not inside. Avoid bright or fluorescent light, but do not dim environment to the point that going into a normally lit room hurts. Consider FL-41 tint lenses, which reduce the most irritating wavelengths without blocking too much light.  These can be obtained at axonoptics.com or theraspecs.com Foods: see list above.   2. Limit use of acute treatments (over-the-counter medications, triptans, etc.) to no more than 2 days per week or 10 days per month to prevent medication overuse headache (rebound headache).     3. Follow a regular schedule (including weekends and holidays): Don't skip meals. Eat a balanced diet. 8 hours of sleep nightly. Minimize stress. Exercise 30 minutes per day. Being overweight is associated with a 5 times increased risk of chronic migraine. Keep well hydrated and drink 6-8 glasses of water per day.   4. Initiate non-pharmacologic measures at the earliest onset of your headache. Rest and quiet environment. Relax and reduce stress. Breathe2Relax is a free app that can instruct you on    some simple relaxtion and breathing techniques. Http://Dawnbuse.com is a    free website that provides teaching videos on relaxation.  Also, there are  many apps that   can be downloaded for "mindful" relaxation.  An app called YOGA NIDRA will help walk you through mindfulness. Another app called Calm can be downloaded to give you a structured mindfulness guide with daily reminders and skill development. Headspace for guided meditation Mindfulness Based Stress Reduction Online Course: www.palousemindfulness.com Cold compresses.   5. Don't wait!! Take the maximum allowable dosage of prescribed medication at the first sign of  migraine.   6. Compliance:  Take prescribed medication regularly as directed and at the first sign of a migraine.   7. Communicate:  Call your physician when problems arise, especially if your headaches change, increase in frequency/severity, or become associated with neurological symptoms (weakness, numbness, slurred speech, etc.). Proceed to emergency room if you experience new or worsening symptoms or symptoms do not resolve, if you have new neurologic symptoms or if headache is severe, or for any concerning symptom.   8. Headache/pain management therapies: Consider various complementary methods, including medication, behavioral therapy, psychological counselling, biofeedback, massage therapy, acupuncture, dry needling, and other modalities.  Such measures may reduce the need for medications. Counseling for pain management, where patients learn to function and ignore/minimize their pain, seems to work very well.   9. Recommend changing family's attention and focus away from patient's headaches. Instead, emphasize daily activities. If first question of day is 'How are your headaches/Do you have a headache today?', then patient will constantly think about headaches, thus making them worse. Goal is to re-direct attention away from headaches, toward daily activities and other distractions.   10. Helpful Websites: www.AmericanHeadacheSociety.org PatentHood.ch www.headaches.org TightMarket.nl www.achenet.org
